# Patient Record
Sex: Female | Born: 1950 | Race: White | Hispanic: No | Marital: Married | Smoking: Former smoker
Health system: Southern US, Community
[De-identification: ages and names within clinical notes are randomized; demographics above are authoritative.]

## PROBLEM LIST (undated history)

## (undated) DIAGNOSIS — F419 Anxiety disorder, unspecified: Secondary | ICD-10-CM

## (undated) DIAGNOSIS — K5792 Diverticulitis of intestine, part unspecified, without perforation or abscess without bleeding: Secondary | ICD-10-CM

## (undated) DIAGNOSIS — R112 Nausea with vomiting, unspecified: Secondary | ICD-10-CM

## (undated) DIAGNOSIS — M5126 Other intervertebral disc displacement, lumbar region: Secondary | ICD-10-CM

## (undated) DIAGNOSIS — N390 Urinary tract infection, site not specified: Secondary | ICD-10-CM

## (undated) DIAGNOSIS — B019 Varicella without complication: Secondary | ICD-10-CM

## (undated) DIAGNOSIS — Z9221 Personal history of antineoplastic chemotherapy: Secondary | ICD-10-CM

## (undated) DIAGNOSIS — Z923 Personal history of irradiation: Secondary | ICD-10-CM

## (undated) DIAGNOSIS — F32A Depression, unspecified: Secondary | ICD-10-CM

## (undated) DIAGNOSIS — M1990 Unspecified osteoarthritis, unspecified site: Secondary | ICD-10-CM

## (undated) DIAGNOSIS — C801 Malignant (primary) neoplasm, unspecified: Secondary | ICD-10-CM

## (undated) DIAGNOSIS — T7840XA Allergy, unspecified, initial encounter: Secondary | ICD-10-CM

## (undated) DIAGNOSIS — Z9889 Other specified postprocedural states: Secondary | ICD-10-CM

## (undated) DIAGNOSIS — F329 Major depressive disorder, single episode, unspecified: Secondary | ICD-10-CM

## (undated) HISTORY — PX: TONSILECTOMY/ADENOIDECTOMY WITH MYRINGOTOMY: SHX6125

## (undated) HISTORY — DX: Varicella without complication: B01.9

## (undated) HISTORY — DX: Major depressive disorder, single episode, unspecified: F32.9

## (undated) HISTORY — PX: FRACTURE SURGERY: SHX138

## (undated) HISTORY — PX: TUBAL LIGATION: SHX77

## (undated) HISTORY — DX: Urinary tract infection, site not specified: N39.0

## (undated) HISTORY — DX: Depression, unspecified: F32.A

## (undated) HISTORY — DX: Allergy, unspecified, initial encounter: T78.40XA

## (undated) HISTORY — DX: Diverticulitis of intestine, part unspecified, without perforation or abscess without bleeding: K57.92

## (undated) HISTORY — PX: COSMETIC SURGERY: SHX468

## (undated) HISTORY — DX: Unspecified osteoarthritis, unspecified site: M19.90

## (undated) HISTORY — PX: COLONOSCOPY: SHX174

## (undated) HISTORY — PX: ABDOMINAL HYSTERECTOMY: SHX81

## (undated) HISTORY — DX: Anxiety disorder, unspecified: F41.9

## (undated) HISTORY — PX: TONSILLECTOMY: SUR1361

---

## 1997-12-27 HISTORY — PX: BREAST BIOPSY: SHX20

## 1998-12-27 HISTORY — PX: BREAST LUMPECTOMY: SHX2

## 2015-08-18 LAB — HM COLONOSCOPY

## 2016-08-16 DIAGNOSIS — Z85828 Personal history of other malignant neoplasm of skin: Secondary | ICD-10-CM | POA: Diagnosis not present

## 2016-08-16 DIAGNOSIS — Z0000 Encounter for general adult medical examination without abnormal findings: Secondary | ICD-10-CM | POA: Diagnosis not present

## 2016-08-16 DIAGNOSIS — Z1283 Encounter for screening for malignant neoplasm of skin: Secondary | ICD-10-CM | POA: Diagnosis not present

## 2016-08-16 DIAGNOSIS — L570 Actinic keratosis: Secondary | ICD-10-CM | POA: Diagnosis not present

## 2016-08-16 DIAGNOSIS — L821 Other seborrheic keratosis: Secondary | ICD-10-CM | POA: Diagnosis not present

## 2016-08-17 DIAGNOSIS — Z1231 Encounter for screening mammogram for malignant neoplasm of breast: Secondary | ICD-10-CM | POA: Diagnosis not present

## 2016-08-18 DIAGNOSIS — Z853 Personal history of malignant neoplasm of breast: Secondary | ICD-10-CM | POA: Diagnosis not present

## 2016-08-18 DIAGNOSIS — F411 Generalized anxiety disorder: Secondary | ICD-10-CM | POA: Diagnosis not present

## 2016-08-18 DIAGNOSIS — Z08 Encounter for follow-up examination after completed treatment for malignant neoplasm: Secondary | ICD-10-CM | POA: Diagnosis not present

## 2016-08-18 DIAGNOSIS — Z0000 Encounter for general adult medical examination without abnormal findings: Secondary | ICD-10-CM | POA: Diagnosis not present

## 2016-08-18 DIAGNOSIS — H9193 Unspecified hearing loss, bilateral: Secondary | ICD-10-CM | POA: Diagnosis not present

## 2016-08-19 DIAGNOSIS — H16223 Keratoconjunctivitis sicca, not specified as Sjogren's, bilateral: Secondary | ICD-10-CM | POA: Diagnosis not present

## 2016-08-19 DIAGNOSIS — H43813 Vitreous degeneration, bilateral: Secondary | ICD-10-CM | POA: Diagnosis not present

## 2016-08-19 DIAGNOSIS — H2513 Age-related nuclear cataract, bilateral: Secondary | ICD-10-CM | POA: Diagnosis not present

## 2016-11-11 DIAGNOSIS — Z23 Encounter for immunization: Secondary | ICD-10-CM | POA: Diagnosis not present

## 2016-11-13 DIAGNOSIS — H8110 Benign paroxysmal vertigo, unspecified ear: Secondary | ICD-10-CM | POA: Diagnosis not present

## 2016-11-24 DIAGNOSIS — R42 Dizziness and giddiness: Secondary | ICD-10-CM | POA: Diagnosis not present

## 2016-11-24 DIAGNOSIS — Z853 Personal history of malignant neoplasm of breast: Secondary | ICD-10-CM | POA: Diagnosis not present

## 2016-11-24 DIAGNOSIS — Z9221 Personal history of antineoplastic chemotherapy: Secondary | ICD-10-CM | POA: Diagnosis not present

## 2017-02-07 DIAGNOSIS — L905 Scar conditions and fibrosis of skin: Secondary | ICD-10-CM | POA: Diagnosis not present

## 2017-02-09 DIAGNOSIS — Z136 Encounter for screening for cardiovascular disorders: Secondary | ICD-10-CM | POA: Diagnosis not present

## 2017-02-09 DIAGNOSIS — Z131 Encounter for screening for diabetes mellitus: Secondary | ICD-10-CM | POA: Diagnosis not present

## 2017-02-09 DIAGNOSIS — M899 Disorder of bone, unspecified: Secondary | ICD-10-CM | POA: Diagnosis not present

## 2017-02-09 DIAGNOSIS — F411 Generalized anxiety disorder: Secondary | ICD-10-CM | POA: Diagnosis not present

## 2017-02-09 DIAGNOSIS — Z0000 Encounter for general adult medical examination without abnormal findings: Secondary | ICD-10-CM | POA: Diagnosis not present

## 2017-02-11 DIAGNOSIS — F411 Generalized anxiety disorder: Secondary | ICD-10-CM | POA: Diagnosis not present

## 2017-02-17 DIAGNOSIS — L821 Other seborrheic keratosis: Secondary | ICD-10-CM | POA: Diagnosis not present

## 2017-02-21 DIAGNOSIS — L538 Other specified erythematous conditions: Secondary | ICD-10-CM | POA: Diagnosis not present

## 2017-02-21 DIAGNOSIS — R208 Other disturbances of skin sensation: Secondary | ICD-10-CM | POA: Diagnosis not present

## 2017-02-21 DIAGNOSIS — L820 Inflamed seborrheic keratosis: Secondary | ICD-10-CM | POA: Diagnosis not present

## 2017-05-04 DIAGNOSIS — M9905 Segmental and somatic dysfunction of pelvic region: Secondary | ICD-10-CM | POA: Diagnosis not present

## 2017-05-04 DIAGNOSIS — M545 Low back pain: Secondary | ICD-10-CM | POA: Diagnosis not present

## 2017-05-04 DIAGNOSIS — M5432 Sciatica, left side: Secondary | ICD-10-CM | POA: Diagnosis not present

## 2017-05-04 DIAGNOSIS — M62830 Muscle spasm of back: Secondary | ICD-10-CM | POA: Diagnosis not present

## 2017-05-11 DIAGNOSIS — M62830 Muscle spasm of back: Secondary | ICD-10-CM | POA: Diagnosis not present

## 2017-05-11 DIAGNOSIS — M545 Low back pain: Secondary | ICD-10-CM | POA: Diagnosis not present

## 2017-05-11 DIAGNOSIS — M5432 Sciatica, left side: Secondary | ICD-10-CM | POA: Diagnosis not present

## 2017-05-11 DIAGNOSIS — M9905 Segmental and somatic dysfunction of pelvic region: Secondary | ICD-10-CM | POA: Diagnosis not present

## 2017-05-18 DIAGNOSIS — M5432 Sciatica, left side: Secondary | ICD-10-CM | POA: Diagnosis not present

## 2017-05-18 DIAGNOSIS — M9905 Segmental and somatic dysfunction of pelvic region: Secondary | ICD-10-CM | POA: Diagnosis not present

## 2017-05-18 DIAGNOSIS — M62830 Muscle spasm of back: Secondary | ICD-10-CM | POA: Diagnosis not present

## 2017-05-18 DIAGNOSIS — M545 Low back pain: Secondary | ICD-10-CM | POA: Diagnosis not present

## 2017-05-25 DIAGNOSIS — M545 Low back pain: Secondary | ICD-10-CM | POA: Diagnosis not present

## 2017-05-25 DIAGNOSIS — M5432 Sciatica, left side: Secondary | ICD-10-CM | POA: Diagnosis not present

## 2017-05-25 DIAGNOSIS — M9905 Segmental and somatic dysfunction of pelvic region: Secondary | ICD-10-CM | POA: Diagnosis not present

## 2017-05-25 DIAGNOSIS — M62830 Muscle spasm of back: Secondary | ICD-10-CM | POA: Diagnosis not present

## 2017-06-15 DIAGNOSIS — M5432 Sciatica, left side: Secondary | ICD-10-CM | POA: Diagnosis not present

## 2017-06-15 DIAGNOSIS — M62830 Muscle spasm of back: Secondary | ICD-10-CM | POA: Diagnosis not present

## 2017-06-15 DIAGNOSIS — M9905 Segmental and somatic dysfunction of pelvic region: Secondary | ICD-10-CM | POA: Diagnosis not present

## 2017-06-15 DIAGNOSIS — M545 Low back pain: Secondary | ICD-10-CM | POA: Diagnosis not present

## 2017-07-13 DIAGNOSIS — M545 Low back pain: Secondary | ICD-10-CM | POA: Diagnosis not present

## 2017-07-13 DIAGNOSIS — L570 Actinic keratosis: Secondary | ICD-10-CM | POA: Diagnosis not present

## 2017-07-13 DIAGNOSIS — M9905 Segmental and somatic dysfunction of pelvic region: Secondary | ICD-10-CM | POA: Diagnosis not present

## 2017-07-13 DIAGNOSIS — Z08 Encounter for follow-up examination after completed treatment for malignant neoplasm: Secondary | ICD-10-CM | POA: Diagnosis not present

## 2017-07-13 DIAGNOSIS — Z85828 Personal history of other malignant neoplasm of skin: Secondary | ICD-10-CM | POA: Diagnosis not present

## 2017-07-13 DIAGNOSIS — M5432 Sciatica, left side: Secondary | ICD-10-CM | POA: Diagnosis not present

## 2017-07-13 DIAGNOSIS — M62830 Muscle spasm of back: Secondary | ICD-10-CM | POA: Diagnosis not present

## 2017-07-27 DIAGNOSIS — M5432 Sciatica, left side: Secondary | ICD-10-CM | POA: Diagnosis not present

## 2017-07-27 DIAGNOSIS — M62830 Muscle spasm of back: Secondary | ICD-10-CM | POA: Diagnosis not present

## 2017-07-27 DIAGNOSIS — M545 Low back pain: Secondary | ICD-10-CM | POA: Diagnosis not present

## 2017-07-27 DIAGNOSIS — M9905 Segmental and somatic dysfunction of pelvic region: Secondary | ICD-10-CM | POA: Diagnosis not present

## 2017-08-10 DIAGNOSIS — M9905 Segmental and somatic dysfunction of pelvic region: Secondary | ICD-10-CM | POA: Diagnosis not present

## 2017-08-10 DIAGNOSIS — M5432 Sciatica, left side: Secondary | ICD-10-CM | POA: Diagnosis not present

## 2017-08-10 DIAGNOSIS — M62830 Muscle spasm of back: Secondary | ICD-10-CM | POA: Diagnosis not present

## 2017-08-10 DIAGNOSIS — M545 Low back pain: Secondary | ICD-10-CM | POA: Diagnosis not present

## 2017-08-31 DIAGNOSIS — M9905 Segmental and somatic dysfunction of pelvic region: Secondary | ICD-10-CM | POA: Diagnosis not present

## 2017-08-31 DIAGNOSIS — M62830 Muscle spasm of back: Secondary | ICD-10-CM | POA: Diagnosis not present

## 2017-08-31 DIAGNOSIS — M5432 Sciatica, left side: Secondary | ICD-10-CM | POA: Diagnosis not present

## 2017-08-31 DIAGNOSIS — Z85828 Personal history of other malignant neoplasm of skin: Secondary | ICD-10-CM | POA: Diagnosis not present

## 2017-08-31 DIAGNOSIS — D225 Melanocytic nevi of trunk: Secondary | ICD-10-CM | POA: Diagnosis not present

## 2017-08-31 DIAGNOSIS — D485 Neoplasm of uncertain behavior of skin: Secondary | ICD-10-CM | POA: Diagnosis not present

## 2017-08-31 DIAGNOSIS — Z08 Encounter for follow-up examination after completed treatment for malignant neoplasm: Secondary | ICD-10-CM | POA: Diagnosis not present

## 2017-08-31 DIAGNOSIS — D2272 Melanocytic nevi of left lower limb, including hip: Secondary | ICD-10-CM | POA: Diagnosis not present

## 2017-08-31 DIAGNOSIS — M545 Low back pain: Secondary | ICD-10-CM | POA: Diagnosis not present

## 2017-09-15 DIAGNOSIS — Z08 Encounter for follow-up examination after completed treatment for malignant neoplasm: Secondary | ICD-10-CM | POA: Diagnosis not present

## 2017-09-15 DIAGNOSIS — Z853 Personal history of malignant neoplasm of breast: Secondary | ICD-10-CM | POA: Diagnosis not present

## 2017-09-19 DIAGNOSIS — M62830 Muscle spasm of back: Secondary | ICD-10-CM | POA: Diagnosis not present

## 2017-09-19 DIAGNOSIS — M9905 Segmental and somatic dysfunction of pelvic region: Secondary | ICD-10-CM | POA: Diagnosis not present

## 2017-09-19 DIAGNOSIS — M545 Low back pain: Secondary | ICD-10-CM | POA: Diagnosis not present

## 2017-09-19 DIAGNOSIS — M5432 Sciatica, left side: Secondary | ICD-10-CM | POA: Diagnosis not present

## 2017-09-30 DIAGNOSIS — Z23 Encounter for immunization: Secondary | ICD-10-CM | POA: Diagnosis not present

## 2017-10-10 DIAGNOSIS — Z1231 Encounter for screening mammogram for malignant neoplasm of breast: Secondary | ICD-10-CM | POA: Diagnosis not present

## 2017-10-11 DIAGNOSIS — L820 Inflamed seborrheic keratosis: Secondary | ICD-10-CM | POA: Diagnosis not present

## 2017-10-11 DIAGNOSIS — D485 Neoplasm of uncertain behavior of skin: Secondary | ICD-10-CM | POA: Diagnosis not present

## 2017-10-11 DIAGNOSIS — Z872 Personal history of diseases of the skin and subcutaneous tissue: Secondary | ICD-10-CM | POA: Diagnosis not present

## 2017-10-11 DIAGNOSIS — L298 Other pruritus: Secondary | ICD-10-CM | POA: Diagnosis not present

## 2017-10-11 DIAGNOSIS — D2272 Melanocytic nevi of left lower limb, including hip: Secondary | ICD-10-CM | POA: Diagnosis not present

## 2017-10-11 DIAGNOSIS — Z09 Encounter for follow-up examination after completed treatment for conditions other than malignant neoplasm: Secondary | ICD-10-CM | POA: Diagnosis not present

## 2017-10-11 DIAGNOSIS — D225 Melanocytic nevi of trunk: Secondary | ICD-10-CM | POA: Diagnosis not present

## 2017-10-12 DIAGNOSIS — M545 Low back pain: Secondary | ICD-10-CM | POA: Diagnosis not present

## 2017-10-12 DIAGNOSIS — M9905 Segmental and somatic dysfunction of pelvic region: Secondary | ICD-10-CM | POA: Diagnosis not present

## 2017-10-12 DIAGNOSIS — M5432 Sciatica, left side: Secondary | ICD-10-CM | POA: Diagnosis not present

## 2017-10-12 DIAGNOSIS — M62830 Muscle spasm of back: Secondary | ICD-10-CM | POA: Diagnosis not present

## 2017-11-09 DIAGNOSIS — M9905 Segmental and somatic dysfunction of pelvic region: Secondary | ICD-10-CM | POA: Diagnosis not present

## 2017-11-09 DIAGNOSIS — M545 Low back pain: Secondary | ICD-10-CM | POA: Diagnosis not present

## 2017-11-09 DIAGNOSIS — M5432 Sciatica, left side: Secondary | ICD-10-CM | POA: Diagnosis not present

## 2017-11-09 DIAGNOSIS — M62830 Muscle spasm of back: Secondary | ICD-10-CM | POA: Diagnosis not present

## 2017-11-30 DIAGNOSIS — M545 Low back pain: Secondary | ICD-10-CM | POA: Diagnosis not present

## 2017-11-30 DIAGNOSIS — M9905 Segmental and somatic dysfunction of pelvic region: Secondary | ICD-10-CM | POA: Diagnosis not present

## 2017-11-30 DIAGNOSIS — M5432 Sciatica, left side: Secondary | ICD-10-CM | POA: Diagnosis not present

## 2017-11-30 DIAGNOSIS — M62830 Muscle spasm of back: Secondary | ICD-10-CM | POA: Diagnosis not present

## 2018-01-04 DIAGNOSIS — M9905 Segmental and somatic dysfunction of pelvic region: Secondary | ICD-10-CM | POA: Diagnosis not present

## 2018-01-04 DIAGNOSIS — M62830 Muscle spasm of back: Secondary | ICD-10-CM | POA: Diagnosis not present

## 2018-01-04 DIAGNOSIS — M5432 Sciatica, left side: Secondary | ICD-10-CM | POA: Diagnosis not present

## 2018-01-04 DIAGNOSIS — M545 Low back pain: Secondary | ICD-10-CM | POA: Diagnosis not present

## 2018-02-14 DIAGNOSIS — R82998 Other abnormal findings in urine: Secondary | ICD-10-CM | POA: Diagnosis not present

## 2018-02-14 DIAGNOSIS — F4310 Post-traumatic stress disorder, unspecified: Secondary | ICD-10-CM | POA: Diagnosis not present

## 2018-02-14 DIAGNOSIS — R7989 Other specified abnormal findings of blood chemistry: Secondary | ICD-10-CM | POA: Diagnosis not present

## 2018-02-14 DIAGNOSIS — Z0000 Encounter for general adult medical examination without abnormal findings: Secondary | ICD-10-CM | POA: Diagnosis not present

## 2018-02-14 DIAGNOSIS — Z6821 Body mass index (BMI) 21.0-21.9, adult: Secondary | ICD-10-CM | POA: Diagnosis not present

## 2018-02-14 DIAGNOSIS — Z853 Personal history of malignant neoplasm of breast: Secondary | ICD-10-CM | POA: Diagnosis not present

## 2018-02-14 DIAGNOSIS — H9193 Unspecified hearing loss, bilateral: Secondary | ICD-10-CM | POA: Diagnosis not present

## 2018-02-14 DIAGNOSIS — Z79899 Other long term (current) drug therapy: Secondary | ICD-10-CM | POA: Diagnosis not present

## 2018-02-14 LAB — BASIC METABOLIC PANEL
BUN: 14 (ref 4–21)
Creatinine: 0.9 (ref 0.5–1.1)
Glucose: 108
Sodium: 140 (ref 137–147)

## 2018-02-14 LAB — VITAMIN D 25 HYDROXY (VIT D DEFICIENCY, FRACTURES): Vit D, 25-Hydroxy: 50.5

## 2018-02-14 LAB — CBC AND DIFFERENTIAL: WBC: 6.3

## 2018-02-14 LAB — TSH: TSH: 1.26 (ref 0.41–5.90)

## 2018-02-14 LAB — HEPATIC FUNCTION PANEL: Bilirubin, Total: 0.5

## 2018-02-15 DIAGNOSIS — M62830 Muscle spasm of back: Secondary | ICD-10-CM | POA: Diagnosis not present

## 2018-02-15 DIAGNOSIS — M545 Low back pain: Secondary | ICD-10-CM | POA: Diagnosis not present

## 2018-02-15 DIAGNOSIS — M5432 Sciatica, left side: Secondary | ICD-10-CM | POA: Diagnosis not present

## 2018-02-15 DIAGNOSIS — M9905 Segmental and somatic dysfunction of pelvic region: Secondary | ICD-10-CM | POA: Diagnosis not present

## 2018-02-22 DIAGNOSIS — D485 Neoplasm of uncertain behavior of skin: Secondary | ICD-10-CM | POA: Diagnosis not present

## 2018-02-22 DIAGNOSIS — L570 Actinic keratosis: Secondary | ICD-10-CM | POA: Diagnosis not present

## 2018-02-22 DIAGNOSIS — Z1283 Encounter for screening for malignant neoplasm of skin: Secondary | ICD-10-CM | POA: Diagnosis not present

## 2018-02-22 DIAGNOSIS — L821 Other seborrheic keratosis: Secondary | ICD-10-CM | POA: Diagnosis not present

## 2018-03-15 DIAGNOSIS — M859 Disorder of bone density and structure, unspecified: Secondary | ICD-10-CM | POA: Diagnosis not present

## 2018-03-15 DIAGNOSIS — Z23 Encounter for immunization: Secondary | ICD-10-CM | POA: Diagnosis not present

## 2018-03-15 DIAGNOSIS — L905 Scar conditions and fibrosis of skin: Secondary | ICD-10-CM | POA: Diagnosis not present

## 2018-03-15 DIAGNOSIS — Z85828 Personal history of other malignant neoplasm of skin: Secondary | ICD-10-CM | POA: Diagnosis not present

## 2018-03-15 LAB — HM DEXA SCAN

## 2018-03-16 DIAGNOSIS — Z1212 Encounter for screening for malignant neoplasm of rectum: Secondary | ICD-10-CM | POA: Diagnosis not present

## 2018-03-16 LAB — IFOBT (OCCULT BLOOD): IFOBT: NEGATIVE

## 2018-03-22 DIAGNOSIS — M9905 Segmental and somatic dysfunction of pelvic region: Secondary | ICD-10-CM | POA: Diagnosis not present

## 2018-03-22 DIAGNOSIS — M62830 Muscle spasm of back: Secondary | ICD-10-CM | POA: Diagnosis not present

## 2018-03-22 DIAGNOSIS — M5432 Sciatica, left side: Secondary | ICD-10-CM | POA: Diagnosis not present

## 2018-03-22 DIAGNOSIS — M545 Low back pain: Secondary | ICD-10-CM | POA: Diagnosis not present

## 2018-03-27 DIAGNOSIS — L570 Actinic keratosis: Secondary | ICD-10-CM | POA: Diagnosis not present

## 2018-03-30 DIAGNOSIS — N390 Urinary tract infection, site not specified: Secondary | ICD-10-CM | POA: Diagnosis not present

## 2018-03-30 DIAGNOSIS — R300 Dysuria: Secondary | ICD-10-CM | POA: Diagnosis not present

## 2018-04-22 DIAGNOSIS — M62830 Muscle spasm of back: Secondary | ICD-10-CM | POA: Diagnosis not present

## 2018-04-22 DIAGNOSIS — M9905 Segmental and somatic dysfunction of pelvic region: Secondary | ICD-10-CM | POA: Diagnosis not present

## 2018-04-22 DIAGNOSIS — M545 Low back pain: Secondary | ICD-10-CM | POA: Diagnosis not present

## 2018-04-22 DIAGNOSIS — M5432 Sciatica, left side: Secondary | ICD-10-CM | POA: Diagnosis not present

## 2018-05-11 DIAGNOSIS — H25013 Cortical age-related cataract, bilateral: Secondary | ICD-10-CM | POA: Diagnosis not present

## 2018-05-11 DIAGNOSIS — H04123 Dry eye syndrome of bilateral lacrimal glands: Secondary | ICD-10-CM | POA: Diagnosis not present

## 2018-05-11 DIAGNOSIS — H5213 Myopia, bilateral: Secondary | ICD-10-CM | POA: Diagnosis not present

## 2018-05-11 LAB — HM DIABETES EYE EXAM

## 2018-07-15 DIAGNOSIS — M545 Low back pain: Secondary | ICD-10-CM | POA: Diagnosis not present

## 2018-07-15 DIAGNOSIS — M62830 Muscle spasm of back: Secondary | ICD-10-CM | POA: Diagnosis not present

## 2018-07-15 DIAGNOSIS — M9905 Segmental and somatic dysfunction of pelvic region: Secondary | ICD-10-CM | POA: Diagnosis not present

## 2018-07-15 DIAGNOSIS — M5432 Sciatica, left side: Secondary | ICD-10-CM | POA: Diagnosis not present

## 2018-09-02 DIAGNOSIS — Z23 Encounter for immunization: Secondary | ICD-10-CM | POA: Diagnosis not present

## 2018-10-09 DIAGNOSIS — L814 Other melanin hyperpigmentation: Secondary | ICD-10-CM | POA: Diagnosis not present

## 2018-10-09 DIAGNOSIS — D225 Melanocytic nevi of trunk: Secondary | ICD-10-CM | POA: Diagnosis not present

## 2018-10-09 DIAGNOSIS — Z23 Encounter for immunization: Secondary | ICD-10-CM | POA: Diagnosis not present

## 2018-10-09 DIAGNOSIS — Z85828 Personal history of other malignant neoplasm of skin: Secondary | ICD-10-CM | POA: Diagnosis not present

## 2018-10-09 DIAGNOSIS — D2261 Melanocytic nevi of right upper limb, including shoulder: Secondary | ICD-10-CM | POA: Diagnosis not present

## 2018-10-09 DIAGNOSIS — L821 Other seborrheic keratosis: Secondary | ICD-10-CM | POA: Diagnosis not present

## 2018-10-09 DIAGNOSIS — Z86018 Personal history of other benign neoplasm: Secondary | ICD-10-CM | POA: Diagnosis not present

## 2018-10-09 DIAGNOSIS — L820 Inflamed seborrheic keratosis: Secondary | ICD-10-CM | POA: Diagnosis not present

## 2018-10-12 DIAGNOSIS — M9902 Segmental and somatic dysfunction of thoracic region: Secondary | ICD-10-CM | POA: Diagnosis not present

## 2018-10-12 DIAGNOSIS — M9903 Segmental and somatic dysfunction of lumbar region: Secondary | ICD-10-CM | POA: Diagnosis not present

## 2018-10-12 DIAGNOSIS — M9905 Segmental and somatic dysfunction of pelvic region: Secondary | ICD-10-CM | POA: Diagnosis not present

## 2018-10-12 DIAGNOSIS — M4716 Other spondylosis with myelopathy, lumbar region: Secondary | ICD-10-CM | POA: Diagnosis not present

## 2018-10-17 DIAGNOSIS — M9903 Segmental and somatic dysfunction of lumbar region: Secondary | ICD-10-CM | POA: Diagnosis not present

## 2018-10-17 DIAGNOSIS — M9905 Segmental and somatic dysfunction of pelvic region: Secondary | ICD-10-CM | POA: Diagnosis not present

## 2018-10-17 DIAGNOSIS — M4716 Other spondylosis with myelopathy, lumbar region: Secondary | ICD-10-CM | POA: Diagnosis not present

## 2018-10-17 DIAGNOSIS — M9902 Segmental and somatic dysfunction of thoracic region: Secondary | ICD-10-CM | POA: Diagnosis not present

## 2018-10-25 ENCOUNTER — Other Ambulatory Visit: Payer: Self-pay | Admitting: Internal Medicine

## 2018-10-25 DIAGNOSIS — Z1231 Encounter for screening mammogram for malignant neoplasm of breast: Secondary | ICD-10-CM

## 2018-11-08 ENCOUNTER — Other Ambulatory Visit: Payer: Self-pay | Admitting: Internal Medicine

## 2018-11-08 DIAGNOSIS — Z1231 Encounter for screening mammogram for malignant neoplasm of breast: Secondary | ICD-10-CM

## 2018-11-14 DIAGNOSIS — M9905 Segmental and somatic dysfunction of pelvic region: Secondary | ICD-10-CM | POA: Diagnosis not present

## 2018-11-14 DIAGNOSIS — M9902 Segmental and somatic dysfunction of thoracic region: Secondary | ICD-10-CM | POA: Diagnosis not present

## 2018-11-14 DIAGNOSIS — M4716 Other spondylosis with myelopathy, lumbar region: Secondary | ICD-10-CM | POA: Diagnosis not present

## 2018-11-14 DIAGNOSIS — M9903 Segmental and somatic dysfunction of lumbar region: Secondary | ICD-10-CM | POA: Diagnosis not present

## 2018-11-16 DIAGNOSIS — M9902 Segmental and somatic dysfunction of thoracic region: Secondary | ICD-10-CM | POA: Diagnosis not present

## 2018-11-16 DIAGNOSIS — M4716 Other spondylosis with myelopathy, lumbar region: Secondary | ICD-10-CM | POA: Diagnosis not present

## 2018-11-16 DIAGNOSIS — M9903 Segmental and somatic dysfunction of lumbar region: Secondary | ICD-10-CM | POA: Diagnosis not present

## 2018-11-16 DIAGNOSIS — M9905 Segmental and somatic dysfunction of pelvic region: Secondary | ICD-10-CM | POA: Diagnosis not present

## 2018-11-21 DIAGNOSIS — M9903 Segmental and somatic dysfunction of lumbar region: Secondary | ICD-10-CM | POA: Diagnosis not present

## 2018-11-21 DIAGNOSIS — M4716 Other spondylosis with myelopathy, lumbar region: Secondary | ICD-10-CM | POA: Diagnosis not present

## 2018-11-21 DIAGNOSIS — M9902 Segmental and somatic dysfunction of thoracic region: Secondary | ICD-10-CM | POA: Diagnosis not present

## 2018-11-21 DIAGNOSIS — M9905 Segmental and somatic dysfunction of pelvic region: Secondary | ICD-10-CM | POA: Diagnosis not present

## 2018-11-28 DIAGNOSIS — M9905 Segmental and somatic dysfunction of pelvic region: Secondary | ICD-10-CM | POA: Diagnosis not present

## 2018-11-28 DIAGNOSIS — M9902 Segmental and somatic dysfunction of thoracic region: Secondary | ICD-10-CM | POA: Diagnosis not present

## 2018-11-28 DIAGNOSIS — M9903 Segmental and somatic dysfunction of lumbar region: Secondary | ICD-10-CM | POA: Diagnosis not present

## 2018-11-28 DIAGNOSIS — M4716 Other spondylosis with myelopathy, lumbar region: Secondary | ICD-10-CM | POA: Diagnosis not present

## 2018-11-30 DIAGNOSIS — M4716 Other spondylosis with myelopathy, lumbar region: Secondary | ICD-10-CM | POA: Diagnosis not present

## 2018-11-30 DIAGNOSIS — M9902 Segmental and somatic dysfunction of thoracic region: Secondary | ICD-10-CM | POA: Diagnosis not present

## 2018-11-30 DIAGNOSIS — M9903 Segmental and somatic dysfunction of lumbar region: Secondary | ICD-10-CM | POA: Diagnosis not present

## 2018-11-30 DIAGNOSIS — M9905 Segmental and somatic dysfunction of pelvic region: Secondary | ICD-10-CM | POA: Diagnosis not present

## 2018-12-07 DIAGNOSIS — M4716 Other spondylosis with myelopathy, lumbar region: Secondary | ICD-10-CM | POA: Diagnosis not present

## 2018-12-07 DIAGNOSIS — M9905 Segmental and somatic dysfunction of pelvic region: Secondary | ICD-10-CM | POA: Diagnosis not present

## 2018-12-07 DIAGNOSIS — M9903 Segmental and somatic dysfunction of lumbar region: Secondary | ICD-10-CM | POA: Diagnosis not present

## 2018-12-07 DIAGNOSIS — M9902 Segmental and somatic dysfunction of thoracic region: Secondary | ICD-10-CM | POA: Diagnosis not present

## 2018-12-15 DIAGNOSIS — M9905 Segmental and somatic dysfunction of pelvic region: Secondary | ICD-10-CM | POA: Diagnosis not present

## 2018-12-15 DIAGNOSIS — M4716 Other spondylosis with myelopathy, lumbar region: Secondary | ICD-10-CM | POA: Diagnosis not present

## 2018-12-15 DIAGNOSIS — M9903 Segmental and somatic dysfunction of lumbar region: Secondary | ICD-10-CM | POA: Diagnosis not present

## 2018-12-15 DIAGNOSIS — M9902 Segmental and somatic dysfunction of thoracic region: Secondary | ICD-10-CM | POA: Diagnosis not present

## 2018-12-18 ENCOUNTER — Ambulatory Visit
Admission: RE | Admit: 2018-12-18 | Discharge: 2018-12-18 | Disposition: A | Discharge status: Home / Self Care | Payer: Medicare Other | Source: Ambulatory Visit | Attending: Internal Medicine | Admitting: Internal Medicine

## 2018-12-18 DIAGNOSIS — Z1231 Encounter for screening mammogram for malignant neoplasm of breast: Secondary | ICD-10-CM | POA: Diagnosis not present

## 2018-12-18 HISTORY — DX: Personal history of irradiation: Z92.3

## 2018-12-18 HISTORY — DX: Personal history of antineoplastic chemotherapy: Z92.21

## 2018-12-18 LAB — HM MAMMOGRAPHY

## 2019-01-19 DIAGNOSIS — M9905 Segmental and somatic dysfunction of pelvic region: Secondary | ICD-10-CM | POA: Diagnosis not present

## 2019-01-19 DIAGNOSIS — M9903 Segmental and somatic dysfunction of lumbar region: Secondary | ICD-10-CM | POA: Diagnosis not present

## 2019-01-19 DIAGNOSIS — M9902 Segmental and somatic dysfunction of thoracic region: Secondary | ICD-10-CM | POA: Diagnosis not present

## 2019-01-19 DIAGNOSIS — M4716 Other spondylosis with myelopathy, lumbar region: Secondary | ICD-10-CM | POA: Diagnosis not present

## 2019-01-24 DIAGNOSIS — Z853 Personal history of malignant neoplasm of breast: Secondary | ICD-10-CM | POA: Diagnosis not present

## 2019-01-24 DIAGNOSIS — Z08 Encounter for follow-up examination after completed treatment for malignant neoplasm: Secondary | ICD-10-CM | POA: Diagnosis not present

## 2019-02-20 DIAGNOSIS — M4716 Other spondylosis with myelopathy, lumbar region: Secondary | ICD-10-CM | POA: Diagnosis not present

## 2019-02-20 DIAGNOSIS — M9902 Segmental and somatic dysfunction of thoracic region: Secondary | ICD-10-CM | POA: Diagnosis not present

## 2019-02-20 DIAGNOSIS — M9905 Segmental and somatic dysfunction of pelvic region: Secondary | ICD-10-CM | POA: Diagnosis not present

## 2019-02-20 DIAGNOSIS — M9903 Segmental and somatic dysfunction of lumbar region: Secondary | ICD-10-CM | POA: Diagnosis not present

## 2019-03-21 DIAGNOSIS — M9902 Segmental and somatic dysfunction of thoracic region: Secondary | ICD-10-CM | POA: Diagnosis not present

## 2019-03-21 DIAGNOSIS — M4716 Other spondylosis with myelopathy, lumbar region: Secondary | ICD-10-CM | POA: Diagnosis not present

## 2019-03-21 DIAGNOSIS — M9903 Segmental and somatic dysfunction of lumbar region: Secondary | ICD-10-CM | POA: Diagnosis not present

## 2019-03-21 DIAGNOSIS — M9905 Segmental and somatic dysfunction of pelvic region: Secondary | ICD-10-CM | POA: Diagnosis not present

## 2019-03-23 ENCOUNTER — Encounter: Payer: Self-pay | Admitting: Family Medicine

## 2019-03-23 ENCOUNTER — Ambulatory Visit (INDEPENDENT_AMBULATORY_CARE_PROVIDER_SITE_OTHER): Payer: Medicare Other | Admitting: Family Medicine

## 2019-03-23 ENCOUNTER — Other Ambulatory Visit: Payer: Self-pay

## 2019-03-23 VITALS — BP 100/62 | HR 58 | Temp 97.8°F | Ht 64.0 in | Wt 118.8 lb

## 2019-03-23 DIAGNOSIS — M810 Age-related osteoporosis without current pathological fracture: Secondary | ICD-10-CM

## 2019-03-23 DIAGNOSIS — M5126 Other intervertebral disc displacement, lumbar region: Secondary | ICD-10-CM | POA: Insufficient documentation

## 2019-03-23 DIAGNOSIS — M549 Dorsalgia, unspecified: Secondary | ICD-10-CM | POA: Diagnosis not present

## 2019-03-23 DIAGNOSIS — Z85828 Personal history of other malignant neoplasm of skin: Secondary | ICD-10-CM | POA: Diagnosis not present

## 2019-03-23 DIAGNOSIS — R5383 Other fatigue: Secondary | ICD-10-CM | POA: Diagnosis not present

## 2019-03-23 DIAGNOSIS — Z13220 Encounter for screening for lipoid disorders: Secondary | ICD-10-CM | POA: Diagnosis not present

## 2019-03-23 DIAGNOSIS — F341 Dysthymic disorder: Secondary | ICD-10-CM | POA: Insufficient documentation

## 2019-03-23 DIAGNOSIS — H6121 Impacted cerumen, right ear: Secondary | ICD-10-CM

## 2019-03-23 DIAGNOSIS — Z853 Personal history of malignant neoplasm of breast: Secondary | ICD-10-CM

## 2019-03-23 DIAGNOSIS — H9191 Unspecified hearing loss, right ear: Secondary | ICD-10-CM

## 2019-03-23 DIAGNOSIS — E559 Vitamin D deficiency, unspecified: Secondary | ICD-10-CM

## 2019-03-23 MED ORDER — ALENDRONATE SODIUM 70 MG PO TABS: ORAL_TABLET | ORAL | 1 refills | Status: DC

## 2019-03-23 MED ORDER — SERTRALINE HCL 25 MG PO TABS: 25.0000 mg | ORAL_TABLET | Freq: Every day | ORAL | 1 refills | Status: DC

## 2019-03-23 NOTE — Progress Notes (Signed)
Procedure: Cerumen Disimpaction  The patient had a large amount of cerumen in the external auditory canal(s): right  Ear wax softener was used prior to the lavage.  Ear lavage was performed on right ear(s) by Jasper Loser  Curettage by provider was not performed in addition.   There were no complications and following the disimpaction the tympanic membrane was visible.  Charges to be entered in Charge Capture section.

## 2019-03-23 NOTE — Progress Notes (Signed)
Allison [name] is a 68 y.o. female is here to [place].   Patient Care Team: Briscoe Deutscher, DO as PCP - General (Family Medicine)   History of Present Illness:   Chief Complaint  Patient presents with  . Establish Care    Moved to the area 1 yr ago. Moved back to [place] in 2013. Transferring care from [facility].   Marland Kitchen Hearing Loss    hearing test showed changes in R ear. Would like referral for hearing test/specialist/audiologist.   . Basal Cell Carcinoma    on head. Would like referral to Dermatology.   . Left thumb pain    Very active, does a lot of typing. Has beenn doing a lot of lifting and unpacking d/t recent move. No mechanical sx. Worse w/ palpation. No swelling or bruising.   Marland Kitchen Herniated disc    Would like referral to sports med once OMT is available again.   . Leg spasms    Worse at night, causes night disturbance. Taking Magnesium supplements.   . constantly cold   HPI: See Assessment and Plan section for Problem Based Charting of issues discussed today.   Health Maintenance Due  Topic Date Due  . Hepatitis C Screening  [birth year]  . DEXA SCAN  07/16/2016   Depression screen PHQ 2/9 03/23/2019  Decreased Interest 0  Down, Depressed, Hopeless 0  PHQ - 2 Score 0  Altered sleeping 0  Tired, decreased energy 0  Change in appetite 0  Feeling bad or failure about yourself  0  Trouble concentrating 0  Moving slowly or fidgety/restless 0  Suicidal thoughts 0  PHQ-9 Score 0  Difficult doing work/chores Not difficult at all    PMHx, SurgHx, SocialHx, Medications, and Allergies were reviewed in the Visit Navigator and updated as appropriate.   Past Medical History:  Diagnosis Date  . Chicken pox   . Depression   . Diverticulitis   . Personal history of chemotherapy   . Personal history of radiation therapy   . UTI (urinary tract infection)      Past Surgical History:  Procedure Laterality Date  . ABDOMINAL HYSTERECTOMY    . BREAST  BIOPSY Left 1999  . BREAST LUMPECTOMY Left 2000  . TONSILECTOMY/ADENOIDECTOMY WITH MYRINGOTOMY       Family History  Problem Relation Age of Onset  . Alcohol abuse Mother   . Cancer Mother   . Alcohol abuse Father   . Depression Father   . Early death Father   . Alcohol abuse Brother   . Cancer Brother   . Breast cancer Neg Hx     Social History   Tobacco Use  . Smoking status: Former Smoker    Last attempt to quit: 12/27/1972    Years since quitting: 46.2  . Smokeless tobacco: Never Used  Substance Use Topics  . Alcohol use: Yes  . Drug use: Not on file    Current Medications and Allergies   .  alendronate (FOSAMAX) 70 MG tablet, Take one tab by mouth once weekly on empty stomach with a full glass of water, Disp: 12 tablet, Rfl: 1 .  Calcium Citrate-Vitamin D (CALCIUM + D PO), Take by mouth., Disp: , Rfl:  .  MAGNESIUM PO, Take by mouth., Disp: , Rfl:  .  NIACIN PO, Take by mouth., Disp: , Rfl:  .  OVER THE COUNTER MEDICATION, Chinese herbal supplement, Disp: , Rfl:  .  sertraline (ZOLOFT) 25 MG tablet, Take 1 tablet (25  mg total) by mouth daily., Disp: 90 tablet, Rfl: 1  Allergies  Allergen Reactions  . Penicillins     Childhood allergy  . Sulfa Antibiotics Hives and Swelling   Review of Systems   Pertinent items are noted in the HPI. Otherwise, a complete ROS is negative.  Vitals   Vitals:   03/23/19 0921  BP: 100/62  Pulse: (!) 58  Temp: 97.8 F (36.6 C)  TempSrc: Oral  SpO2: 99%  Weight: 118 lb 12.8 oz (53.9 kg)  Height: 5\' 4"  (1.626 m)     Body mass index is 20.39 kg/m.  Physical Exam   Physical Exam Vitals signs and nursing note reviewed.  Constitutional:      General: She is not in acute distress.    Appearance: She is well-developed.  HENT:     Head: Normocephalic and atraumatic.     Right Ear: External ear normal.     Left Ear: External ear normal.     Ears:     Comments: Right cerumen impaction.    Nose: Nose normal.  Eyes:      Conjunctiva/sclera: Conjunctivae normal.     Pupils: Pupils are equal, round, and reactive to light.  Neck:     Musculoskeletal: Normal range of motion and neck supple.     Thyroid: No thyromegaly.  Cardiovascular:     Rate and Rhythm: Normal rate and regular rhythm.     Heart sounds: Normal heart sounds.  Pulmonary:     Effort: Pulmonary effort is normal.     Breath sounds: Normal breath sounds.  Abdominal:     General: Bowel sounds are normal.     Palpations: Abdomen is soft.  Musculoskeletal: Normal range of motion.  Lymphadenopathy:     Cervical: No cervical adenopathy.  Skin:    General: Skin is warm and dry.     Capillary Refill: Capillary refill takes less than 2 seconds.  Neurological:     Mental Status: She is alert and oriented to person, place, and time.  Psychiatric:        Behavior: Behavior normal.     Assessment and Plan   Allison [name] was seen today for establish care, hearing loss, basal cell carcinoma, left thumb pain, herniated disc, leg spasms and constantly cold.  Diagnoses and all orders for this visit:  Osteoporosis without current pathological fracture, unspecified osteoporosis type Comments: Will start Prolia when available. Discussed weight bearing exercises and vitamin D supplement. Orders: -     alendronate (FOSAMAX) 70 MG tablet; Take one tab by mouth once weekly on empty stomach with a full glass of water  Back pain, unspecified back location, unspecified back pain laterality, unspecified chronicity Comments: Hx of bulging disc. Has been seeing Dr. Rhys Martini, would like to see Dr. Paulla Fore. Orders: -     Ambulatory referral to Sports Medicine  Hx of basal cell carcinoma -     Ambulatory referral to Dermatology  Change in hearing of right ear Comments: Lavage today. Hearing screen - failed. Orders: -     Ambulatory referral to Audiology  Fatigue, unspecified type -     CBC with Differential/Platelet; Future -     Comprehensive metabolic panel;  Future -     TSH; Future  Hx of breast cancer, left Comments: Still seeing Oncologist in [place].   Screening for lipid disorders -     Lipid panel; Future  Vitamin D deficiency -     VITAMIN D 25 Hydroxy (Vit-D Deficiency, Fractures); Future  Dysthymia Comments: Stable on Zoloft 25 mg q hs. Continue.  Orders: -     sertraline (ZOLOFT) 25 MG tablet; Take 1 tablet (25 mg total) by mouth daily.  Impacted cerumen of right ear Comments: Lavage by CMA, see note. Orders: -     Ear Lavage   . Orders and follow up as documented in [place], reviewed diet, exercise and weight control, cardiovascular risk and specific lipid/LDL goals reviewed, reviewed medications and side effects in detail.  . Reviewed expectations re: course of current medical issues. . Outlined signs and symptoms indicating need for more acute intervention. . Patient verbalized understanding and all questions were answered. . Patient received an After Visit Summary.  CMA served as Education administrator during this visit. History, Physical, and Plan performed by medical provider. The above documentation has been reviewed and is accurate and complete. Briscoe Deutscher, D.O.  Briscoe Deutscher, DO [place], [place] [facility] 03/23/2019

## 2019-03-26 ENCOUNTER — Other Ambulatory Visit: Payer: Self-pay

## 2019-03-26 ENCOUNTER — Other Ambulatory Visit (INDEPENDENT_AMBULATORY_CARE_PROVIDER_SITE_OTHER): Payer: Medicare Other

## 2019-03-26 DIAGNOSIS — R5383 Other fatigue: Secondary | ICD-10-CM

## 2019-03-26 DIAGNOSIS — E559 Vitamin D deficiency, unspecified: Secondary | ICD-10-CM

## 2019-03-26 DIAGNOSIS — Z13220 Encounter for screening for lipoid disorders: Secondary | ICD-10-CM | POA: Diagnosis not present

## 2019-03-26 LAB — COMPREHENSIVE METABOLIC PANEL
ALT: 15 U/L (ref 0–35)
AST: 22 U/L (ref 0–37)
Albumin: 4.2 g/dL (ref 3.5–5.2)
Alkaline Phosphatase: 33 U/L — ABNORMAL LOW (ref 39–117)
BUN: 14 mg/dL (ref 6–23)
CO2: 28 mEq/L (ref 19–32)
Calcium: 9.4 mg/dL (ref 8.4–10.5)
Chloride: 101 mEq/L (ref 96–112)
Creatinine, Ser: 0.77 mg/dL (ref 0.40–1.20)
GFR: 74.62 mL/min (ref >60.00)
Glucose, Bld: 88 mg/dL (ref 70–99)
Potassium: 5.1 mEq/L (ref 3.5–5.1)
Sodium: 134 mEq/L — ABNORMAL LOW (ref 135–145)
Total Bilirubin: 0.6 mg/dL (ref 0.2–1.2)
Total Protein: 6.4 g/dL (ref 6.0–8.3)

## 2019-03-26 LAB — CBC WITH DIFFERENTIAL/PLATELET
Basophils Absolute: 0 10*3/uL (ref 0.0–0.1)
Basophils Relative: 0.4 % (ref 0.0–3.0)
Eosinophils Absolute: 0 10*3/uL (ref 0.0–0.7)
Eosinophils Relative: 1 % (ref 0.0–5.0)
HCT: 39.4 % (ref 36.0–46.0)
Hemoglobin: 13.4 g/dL (ref 12.0–15.0)
Lymphocytes Relative: 26.8 % (ref 12.0–46.0)
Lymphs Abs: 1 10*3/uL (ref 0.7–4.0)
MCHC: 34.1 g/dL (ref 30.0–36.0)
MCV: 92.1 fl (ref 78.0–100.0)
Monocytes Absolute: 0.3 10*3/uL (ref 0.1–1.0)
Monocytes Relative: 8.3 % (ref 3.0–12.0)
Neutro Abs: 2.3 10*3/uL (ref 1.4–7.7)
Neutrophils Relative %: 63.5 % (ref 43.0–77.0)
Platelets: 300 10*3/uL (ref 150.0–400.0)
RBC: 4.27 Mil/uL (ref 3.87–5.11)
RDW: 13.8 % (ref 11.5–15.5)
WBC: 3.6 10*3/uL — ABNORMAL LOW (ref 4.0–10.5)

## 2019-03-26 LAB — LIPID PANEL
Cholesterol: 213 mg/dL — ABNORMAL HIGH (ref 0–200)
HDL: 89.3 mg/dL (ref >39.00)
LDL Cholesterol: 113 mg/dL — ABNORMAL HIGH (ref 0–99)
NonHDL: 124.11
Total CHOL/HDL Ratio: 2
Triglycerides: 55 mg/dL (ref 0.0–149.0)
VLDL: 11 mg/dL (ref 0.0–40.0)

## 2019-03-26 LAB — TSH: TSH: 1.02 u[IU]/mL (ref 0.35–4.50)

## 2019-03-26 LAB — VITAMIN D 25 HYDROXY (VIT D DEFICIENCY, FRACTURES): VITD: 43.72 ng/mL (ref 30.00–100.00)

## 2019-03-29 DIAGNOSIS — Z85828 Personal history of other malignant neoplasm of skin: Secondary | ICD-10-CM | POA: Diagnosis not present

## 2019-03-29 DIAGNOSIS — D2239 Melanocytic nevi of other parts of face: Secondary | ICD-10-CM | POA: Diagnosis not present

## 2019-03-29 DIAGNOSIS — D2261 Melanocytic nevi of right upper limb, including shoulder: Secondary | ICD-10-CM | POA: Diagnosis not present

## 2019-03-29 DIAGNOSIS — L853 Xerosis cutis: Secondary | ICD-10-CM | POA: Diagnosis not present

## 2019-03-29 DIAGNOSIS — D2372 Other benign neoplasm of skin of left lower limb, including hip: Secondary | ICD-10-CM | POA: Diagnosis not present

## 2019-03-29 DIAGNOSIS — L820 Inflamed seborrheic keratosis: Secondary | ICD-10-CM | POA: Diagnosis not present

## 2019-03-29 DIAGNOSIS — L821 Other seborrheic keratosis: Secondary | ICD-10-CM | POA: Diagnosis not present

## 2019-03-29 DIAGNOSIS — D225 Melanocytic nevi of trunk: Secondary | ICD-10-CM | POA: Diagnosis not present

## 2019-03-29 DIAGNOSIS — L814 Other melanin hyperpigmentation: Secondary | ICD-10-CM | POA: Diagnosis not present

## 2019-03-29 DIAGNOSIS — D2262 Melanocytic nevi of left upper limb, including shoulder: Secondary | ICD-10-CM | POA: Diagnosis not present

## 2019-03-29 DIAGNOSIS — D2272 Melanocytic nevi of left lower limb, including hip: Secondary | ICD-10-CM | POA: Diagnosis not present

## 2019-03-29 DIAGNOSIS — D2271 Melanocytic nevi of right lower limb, including hip: Secondary | ICD-10-CM | POA: Diagnosis not present

## 2019-03-30 ENCOUNTER — Ambulatory Visit (INDEPENDENT_AMBULATORY_CARE_PROVIDER_SITE_OTHER): Payer: Medicare Other | Admitting: Sports Medicine

## 2019-03-30 ENCOUNTER — Encounter: Payer: Self-pay | Admitting: Sports Medicine

## 2019-03-30 ENCOUNTER — Other Ambulatory Visit: Payer: Self-pay

## 2019-03-30 ENCOUNTER — Ambulatory Visit (INDEPENDENT_AMBULATORY_CARE_PROVIDER_SITE_OTHER): Payer: Medicare Other

## 2019-03-30 VITALS — BP 104/70 | HR 61 | Temp 97.8°F | Ht 64.0 in | Wt 119.2 lb

## 2019-03-30 DIAGNOSIS — M545 Low back pain, unspecified: Secondary | ICD-10-CM

## 2019-03-30 DIAGNOSIS — M1812 Unilateral primary osteoarthritis of first carpometacarpal joint, left hand: Secondary | ICD-10-CM

## 2019-03-30 DIAGNOSIS — M24559 Contracture, unspecified hip: Secondary | ICD-10-CM | POA: Diagnosis not present

## 2019-03-30 DIAGNOSIS — G8929 Other chronic pain: Secondary | ICD-10-CM

## 2019-03-30 DIAGNOSIS — M5136 Other intervertebral disc degeneration, lumbar region: Secondary | ICD-10-CM | POA: Diagnosis not present

## 2019-03-30 DIAGNOSIS — M79645 Pain in left finger(s): Secondary | ICD-10-CM

## 2019-03-30 MED ORDER — DICLOFENAC SODIUM 2 % TD SOLN: 1.0000 "application " | Freq: Two times a day (BID) | TRANSDERMAL | 2 refills | Status: DC

## 2019-03-30 MED ORDER — DICLOFENAC SODIUM 2 % TD SOLN: 1.0000 "application " | Freq: Two times a day (BID) | TRANSDERMAL | 0 refills | Status: AC

## 2019-03-30 MED ORDER — DICLOFENAC SODIUM 1 % TD GEL: TRANSDERMAL | 1 refills | Status: AC

## 2019-03-30 NOTE — Progress Notes (Signed)
Allison [name]. , [facility] [place]  Allison [name] - 68 y.o. female MRN [id number]  Date of birth: 1951-02-09  Visit Date: March 30, 2019  PCP: Briscoe Deutscher, DO   Referred by: Briscoe Deutscher, DO  SUBJECTIVE:   Chief Complaint  Patient presents with  . New Patient (Initial Visit)    Low back pain w/ osteoporosis.  Ref by Juleen China    HPI: Patient is here for 2 separate issues.  She does report chronic low back pain that is been present for quite some time.  This is actually preventing her from running.  She has done physical therapy as well as worked with chiropractor in the past with mild to moderate improvement in her symptoms.  She describes this is a mild to moderate aching and tightness in her low back and occasionally into the left shin.  It is worsened with forward flexion and with getting dressed.  She has had 2 prior epidurals due to the back pain that did provide significant long-lasting relief for her.  The symptoms at this time are less than in the past.  REVIEW OF SYSTEMS: Night time disturbances: Denies Fevers, chills and night sweats: Denies Unexplained weight loss: Denies Personal history of cancer: Reports, Breast cancer survivor, left breast Changes in bowel or bladder habits: Denies Recent unreported falls: Denies New or worsening dyspnea or wheezing: Denies Headaches and dizziness: Denies Numbness, tingling and weakness in the extremities: Denies Dizziness or presyncopal episodes: Denies Lower extremity edema: Denies  HISTORY:  Prior history reviewed and updated per electronic medical record.  Patient Active Problem List   Diagnosis Date Noted  . Osteoporosis without current pathological fracture 03/23/2019    Will start Prolia when available. Discussed weight bearing exercises and vitamin D supplement.   Marland Kitchen Dysthymia 03/23/2019    Stable on Zoloft 25 mg q hs. Continue.    . Back pain  03/23/2019    Hx of bulging disc. Has been seeing Dr. Rhys Martini, would like to see Dr. Paulla Fore.   Marland Kitchen Hx of breast cancer, left    Social History   Occupational History  . Not on file  Tobacco Use  . Smoking status: Former Smoker    Last attempt to quit: 12/27/1972    Years since quitting: 46.2  . Smokeless tobacco: Never Used  Substance and Sexual Activity  . Alcohol use: Yes  . Drug use: Not on file  . Sexual activity: Yes   Social History   Social History Narrative  . Not on file   Past Medical History:  Diagnosis Date  . Chicken pox   . Depression   . Diverticulitis   . Personal history of chemotherapy   . Personal history of radiation therapy   . UTI (urinary tract infection)    Past Surgical History:  Procedure Laterality Date  . ABDOMINAL HYSTERECTOMY    . BREAST BIOPSY Left 1999  . BREAST LUMPECTOMY Left 2000  . TONSILECTOMY/ADENOIDECTOMY WITH MYRINGOTOMY     family history includes Alcohol abuse in her brother, father, and mother; Cancer in her brother and mother; Depression in her father; Early death in her father. There is no history of Breast cancer. Current Outpatient Medications on File Prior to Visit  Medication Sig Dispense Refill  . alendronate (FOSAMAX) 70 MG tablet Take one tab by mouth once weekly on empty stomach with a full glass of water 12 tablet 1  . Calcium Citrate-Vitamin D (CALCIUM +  D PO) Take by mouth.    Marland Kitchen MAGNESIUM PO Take by mouth.    Marland Kitchen NIACIN PO Take by mouth.    Marland Kitchen OVER THE COUNTER MEDICATION Chinese herbal supplement    . sertraline (ZOLOFT) 25 MG tablet Take 1 tablet (25 mg total) by mouth daily. 90 tablet 1   No current facility-administered medications on file prior to visit.     OBJECTIVE:  VS:  HT:5\' 4"  (162.6 cm)   WT:119 lb 3.2 oz (54.1 kg)  BMI:20.45    BP:104/70  HR:61bpm  TEMP:97.8 F (36.6 C)( )  RESP:99 %   PHYSICAL EXAM: CONSTITUTIONAL: Well-developed, Well-nourished and In no acute distress EYES: Pupils are  equal., EOM intact without nystagmus. and No scleral icterus. Psychiatric: Alert & appropriately interactive. and Not depressed or anxious appearing. EXTREMITY EXAM: Warm and well perfused  Patient back is overall well aligned.  She walks with a normal gait.  She does have a slight anterior pelvic tilt.  She has negative straight leg raise bilaterally good internal and external rotation bilateral hips.  She is able to heel toe walk without difficulty.  Her left thumb has some generalized osteoarthritic bossing and pain with axial load of the CMC joint.  She has good opposition, flexion, extension, abduction and adduction of the thumb   ASSESSMENT:   1. Chronic right-sided low back pain, unspecified whether sciatica present   2. Pain of left thumb   3. Primary osteoarthritis of first carpometacarpal joint of left hand   4. Hip flexor tightness, unspecified laterality     PROCEDURES:  PROCEDURE NOTE: THERAPEUTIC EXERCISES (58099)  Discussed the foundation of treatment for this condition is physical therapy and/or daily (5-6 days/week) therapeutic exercises, focusing on core strengthening, coordination, neuromuscular control/reeducation. 15 minutes spent for Therapeutic exercises as below and as referenced in the AVS. This included exercises focusing on stretching, strengthening, with significant focus on eccentric aspects.  Proper technique shown and discussed handout in great detail with ATC. All questions were discussed and answered.  Long term goals include an improvement in range of motion, strength, endurance as well as avoiding reinjury. Frequency of visits is one time as determined during today's office visit. Frequency of exercises to be performed is as per handout. EXERCISES REVIEWED: Archie Balboa Exercises Hip ABduction strengthening with focus on Glute Medius Recruitment Hip flexor stretching     PLAN:  Pertinent additional documentation may be included in corresponding procedure  notes, imaging studies, problem based documentation and patient instructions.  No problem-specific Assessment & Plan notes found for this encounter.   Patient does have some underlying arthritis of her Superior joint and this was reviewed on x-ray today.  Topical diclofenac provided.  Can consider injection therapy in the future  Low back is functionally tight.  No significant neural tension signs but she does have grade 1 spondylolisthesis of L4 on L5.  Presenter, broadcasting Program: These exercises were developed by Minerva Ends, DC with a strong emphasis on core neuromuscular reducation and postural realignment through body-weight exercises. Daily practice encouarged and links to [employer] provided today per Patient Instructions.    Home Therapeutic Exercises: New program prescribed today per procedure note.  Discussed the underlying features of tight hip flexors leading to crouched, fetal like position that results in spinal column compression.  Including lumbar hyperflexion with hypermobility, thoracic flexion with restrictive rotation and cervical lordosis reversal.   We did discuss the option for gabapentinoids but at this point she does not feel it  is necessary.  Activity modifications and the importance of avoiding exacerbating activities (limiting pain to no more than a 4 / 10 during or following activity) recommended and discussed.   Discussed red flag symptoms that warrant earlier emergent evaluation and patient voices understanding.    Meds ordered this encounter  Medications  . Diclofenac Sodium (PENNSAID) 2 % SOLN    Sig: Place 1 application onto the skin 2 (two) times daily for 1 day.    Dispense:  8 g    Refill:  0  . DISCONTD: Diclofenac Sodium (PENNSAID) 2 % SOLN    Sig: Place 1 application onto the skin 2 (two) times daily.    Dispense:  112 g    Refill:  2    Home Phone      [phone] Mobile          [phone]   . diclofenac sodium (VOLTAREN) 1  % GEL    Sig: Apply 2 grams topically to affected area qid    Dispense:  100 g    Refill:  1   Lab Orders  No laboratory test(s) ordered today    Imaging Orders     DG Lumbar Spine Complete     DG Finger Thumb Left Referral Orders  No referral(s) requested today    At follow up will plan to consider: initial osteopathic manipulation  Return in about 4 weeks (around 04/27/2019) for Low back pain and OMT.          Gerda Diss, [place] [facility] Physician

## 2019-03-30 NOTE — Patient Instructions (Addendum)
Please perform the exercise program that we have prepared for you and gone over in detail on a daily basis.  In addition to the handout you were provided you can access your program through: [email or web address]   Your unique program code is:  [email or web address]    Also check out [employer]" which is a program developed by Dr. Minerva Ends.   There are links to a couple of his YouTube Videos below and I would like to see you performing one of his videos 5-6 days per week.  It is best to do these exercises first thing in the morning.  They will give you a good jumpstart here today and start normalizing the way you move.  A good intro video is: "Independence from Pain 7-minute Video" - [email or web address]   A more advanced video is: Interior and spatial designer original 12 minutes" - [email or web address].king-[email or web address]/  Exercises that focus more on the neck are as below: Dr. Archie Balboa with [place] teaching neck and shoulder details Part 1 - https://youtu.be/[email or web address] Part 2 Dr. Archie Balboa with [facility] quick routine to practice daily - https://youtu.be/[email or web address]  Do not try to attempt the entire video when first beginning.  Try breaking of each exercise that he goes into shorter segments.  In other words, if they perform an exercise for 45 seconds, start with 15 seconds and rest and then resume when they begin the new activity.  If you work your way up to being able to do these videos without having to stop, I expect you will see significant improvements in your pain.  If you enjoy his videos and would like to find out more you can look on his website: [email or web address]  He has a workout streaming option as well as a DVD set available for purchase.  Amazon has the best price for his DVDs.     Pennsaid instructions: You have been given a sample/prescription for Pennsaid, a topical medication.     You are to apply this gel to your injured body  part twice daily (morning and evening).   A little goes a long way so you can use about a pea-sized amount for each area.   Spread this small amount over the area into a thin film and let it dry.   Be sure that you do not rub the gel into your skin for more than 10 or 15 seconds otherwise it can irritate you skin.    Once you apply the gel, please do not put any other lotion or clothing in contact with that area for 30 minutes to allow the gel to absorb into your skin.   Some people are sensitive to the medication and can develop a sunburn-like rash.  If you have only mild symptoms it is okay to continue to use the medication but if you have any breakdown of your skin you should discontinue its use and please let us know.   If you have been written a prescription for Pennsaid, you will receive a pump bottle of this topical gel through a mail order pharmacy.  The instructions on the bottle will say to apply two pumps twice a day which may be too much gel for your particular area so use the pea-sized amount as your guide.   Instructions for Duexis, Pennsaid and Vimovo:  Your prescription will be filled through a participating [facility] mail order pharmacy.  You will receive a phone call or text from one of the participating pharmacies which can be located in any state  in the [place].  You must communicate directly with them to have this medication filled.  When the pharmacy contacts you, they will need your mailing address (for shipment of the medication) andy they will need payment information if you have a copay (typically no more than $10). If you have not heard from them 2-3 days after your appointment with Dr. Paulla Fore, contact [facility] directly at [phone].

## 2019-04-02 ENCOUNTER — Encounter: Payer: Self-pay | Admitting: Sports Medicine

## 2019-04-02 NOTE — Telephone Encounter (Signed)
Rx was sent to pharmacy. 

## 2019-04-25 ENCOUNTER — Encounter: Payer: Self-pay | Admitting: Sports Medicine

## 2019-04-27 ENCOUNTER — Ambulatory Visit: Payer: BLUE CROSS/BLUE SHIELD | Admitting: Sports Medicine

## 2019-05-07 ENCOUNTER — Encounter: Payer: Self-pay | Admitting: Family Medicine

## 2019-05-10 ENCOUNTER — Encounter: Payer: Self-pay | Admitting: Family Medicine

## 2019-05-14 DIAGNOSIS — H2513 Age-related nuclear cataract, bilateral: Secondary | ICD-10-CM | POA: Diagnosis not present

## 2019-05-14 DIAGNOSIS — H5213 Myopia, bilateral: Secondary | ICD-10-CM | POA: Diagnosis not present

## 2019-05-14 DIAGNOSIS — H04123 Dry eye syndrome of bilateral lacrimal glands: Secondary | ICD-10-CM | POA: Diagnosis not present

## 2019-05-16 ENCOUNTER — Encounter: Payer: Self-pay | Admitting: Family Medicine

## 2019-05-18 NOTE — Telephone Encounter (Signed)
Called pt and advised that we have no documentation of DM, pre-diabetes, or insuline resistance. She thinks they must have just documented something wrong. No further action required.

## 2019-05-28 DIAGNOSIS — H9313 Tinnitus, bilateral: Secondary | ICD-10-CM | POA: Diagnosis not present

## 2019-05-28 DIAGNOSIS — H903 Sensorineural hearing loss, bilateral: Secondary | ICD-10-CM | POA: Diagnosis not present

## 2019-06-18 DIAGNOSIS — Z1159 Encounter for screening for other viral diseases: Secondary | ICD-10-CM | POA: Diagnosis not present

## 2019-08-27 ENCOUNTER — Encounter: Payer: Self-pay | Admitting: Family Medicine

## 2019-08-29 DIAGNOSIS — M9905 Segmental and somatic dysfunction of pelvic region: Secondary | ICD-10-CM | POA: Diagnosis not present

## 2019-08-29 DIAGNOSIS — M4716 Other spondylosis with myelopathy, lumbar region: Secondary | ICD-10-CM | POA: Diagnosis not present

## 2019-08-29 DIAGNOSIS — M9902 Segmental and somatic dysfunction of thoracic region: Secondary | ICD-10-CM | POA: Diagnosis not present

## 2019-08-29 DIAGNOSIS — M9903 Segmental and somatic dysfunction of lumbar region: Secondary | ICD-10-CM | POA: Diagnosis not present

## 2019-09-03 ENCOUNTER — Encounter: Payer: Self-pay | Admitting: Family Medicine

## 2019-09-04 ENCOUNTER — Ambulatory Visit (INDEPENDENT_AMBULATORY_CARE_PROVIDER_SITE_OTHER): Payer: Medicare Other

## 2019-09-04 ENCOUNTER — Ambulatory Visit (INDEPENDENT_AMBULATORY_CARE_PROVIDER_SITE_OTHER): Payer: Medicare Other | Admitting: Family Medicine

## 2019-09-04 ENCOUNTER — Encounter: Payer: Self-pay | Admitting: Family Medicine

## 2019-09-04 ENCOUNTER — Other Ambulatory Visit: Payer: Self-pay

## 2019-09-04 VITALS — BP 130/82 | HR 50 | Temp 97.8°F | Ht 64.0 in | Wt 118.0 lb

## 2019-09-04 DIAGNOSIS — M810 Age-related osteoporosis without current pathological fracture: Secondary | ICD-10-CM

## 2019-09-04 DIAGNOSIS — Z1159 Encounter for screening for other viral diseases: Secondary | ICD-10-CM | POA: Diagnosis not present

## 2019-09-04 DIAGNOSIS — M5441 Lumbago with sciatica, right side: Secondary | ICD-10-CM

## 2019-09-04 DIAGNOSIS — M1611 Unilateral primary osteoarthritis, right hip: Secondary | ICD-10-CM | POA: Diagnosis not present

## 2019-09-04 DIAGNOSIS — Z9189 Other specified personal risk factors, not elsewhere classified: Secondary | ICD-10-CM

## 2019-09-04 DIAGNOSIS — Z23 Encounter for immunization: Secondary | ICD-10-CM

## 2019-09-04 DIAGNOSIS — M25551 Pain in right hip: Secondary | ICD-10-CM | POA: Diagnosis not present

## 2019-09-04 DIAGNOSIS — G8929 Other chronic pain: Secondary | ICD-10-CM | POA: Diagnosis not present

## 2019-09-04 DIAGNOSIS — Z0000 Encounter for general adult medical examination without abnormal findings: Secondary | ICD-10-CM

## 2019-09-04 DIAGNOSIS — E7800 Pure hypercholesterolemia, unspecified: Secondary | ICD-10-CM

## 2019-09-04 DIAGNOSIS — Z853 Personal history of malignant neoplasm of breast: Secondary | ICD-10-CM | POA: Diagnosis not present

## 2019-09-04 DIAGNOSIS — Z13220 Encounter for screening for lipoid disorders: Secondary | ICD-10-CM | POA: Diagnosis not present

## 2019-09-04 DIAGNOSIS — M545 Low back pain: Secondary | ICD-10-CM | POA: Diagnosis not present

## 2019-09-04 LAB — COMPREHENSIVE METABOLIC PANEL
ALT: 13 U/L (ref 0–35)
AST: 23 U/L (ref 0–37)
Albumin: 4.4 g/dL (ref 3.5–5.2)
Alkaline Phosphatase: 39 U/L (ref 39–117)
BUN: 11 mg/dL (ref 6–23)
CO2: 25 mEq/L (ref 19–32)
Calcium: 9.4 mg/dL (ref 8.4–10.5)
Chloride: 97 mEq/L (ref 96–112)
Creatinine, Ser: 0.64 mg/dL (ref 0.40–1.20)
GFR: 92.24 mL/min (ref >60.00)
Glucose, Bld: 78 mg/dL (ref 70–99)
Potassium: 5 mEq/L (ref 3.5–5.1)
Sodium: 132 mEq/L — ABNORMAL LOW (ref 135–145)
Total Bilirubin: 0.8 mg/dL (ref 0.2–1.2)
Total Protein: 7 g/dL (ref 6.0–8.3)

## 2019-09-04 LAB — LIPID PANEL
Cholesterol: 225 mg/dL — ABNORMAL HIGH (ref 0–200)
HDL: 90.8 mg/dL (ref >39.00)
LDL Cholesterol: 119 mg/dL — ABNORMAL HIGH (ref 0–99)
NonHDL: 134.37
Total CHOL/HDL Ratio: 2
Triglycerides: 75 mg/dL (ref 0.0–149.0)
VLDL: 15 mg/dL (ref 0.0–40.0)

## 2019-09-04 LAB — CBC WITH DIFFERENTIAL/PLATELET
Basophils Absolute: 0 10*3/uL (ref 0.0–0.1)
Basophils Relative: 0.3 % (ref 0.0–3.0)
Eosinophils Absolute: 0.1 10*3/uL (ref 0.0–0.7)
Eosinophils Relative: 0.9 % (ref 0.0–5.0)
HCT: 42.5 % (ref 36.0–46.0)
Hemoglobin: 14.2 g/dL (ref 12.0–15.0)
Lymphocytes Relative: 21.6 % (ref 12.0–46.0)
Lymphs Abs: 1.3 10*3/uL (ref 0.7–4.0)
MCHC: 33.3 g/dL (ref 30.0–36.0)
MCV: 94 fl (ref 78.0–100.0)
Monocytes Absolute: 0.4 10*3/uL (ref 0.1–1.0)
Monocytes Relative: 6.9 % (ref 3.0–12.0)
Neutro Abs: 4.2 10*3/uL (ref 1.4–7.7)
Neutrophils Relative %: 70.3 % (ref 43.0–77.0)
Platelets: 339 10*3/uL (ref 150.0–400.0)
RBC: 4.52 Mil/uL (ref 3.87–5.11)
RDW: 14 % (ref 11.5–15.5)
WBC: 5.9 10*3/uL (ref 4.0–10.5)

## 2019-09-04 NOTE — Telephone Encounter (Signed)
Added to patient notes

## 2019-09-04 NOTE — Patient Instructions (Signed)
Health Maintenance Due  Topic Date Due  . Hepatitis C Screening  10/09/1951  . INFLUENZA VACCINE  07/28/2019    Depression screen PHQ 2/9 03/23/2019  Decreased Interest 0  Down, Depressed, Hopeless 0  PHQ - 2 Score 0  Altered sleeping 0  Tired, decreased energy 0  Change in appetite 0  Feeling bad or failure about yourself  0  Trouble concentrating 0  Moving slowly or fidgety/restless 0  Suicidal thoughts 0  PHQ-9 Score 0  Difficult doing work/chores Not difficult at all

## 2019-09-04 NOTE — Progress Notes (Signed)
Allison [name] is a 68 y.o. female is here for follow up.  History of Present Illness:   Allison [name], RMA, acting as scribe for Dr. Briscoe [name].   HPI:   Low back and right hip pain. Hx of back pain, but worsened after running during a medical emergency (helping her husband). Pain low back, right side, with radiation to groin and right leg. Hip pain seems independent. Seeing chiropractor. Hx of osteoporosis.   Health Maintenance Due  Topic Date Due  . Hepatitis C Screening  [birth year]   Depression screen [facility] 2/9 09/04/2019 09/04/2019 03/23/2019  Decreased Interest 0 0 0  Down, Depressed, Hopeless 0 0 0  PHQ - 2 Score 0 0 0  Altered sleeping 0 0 0  Tired, decreased energy 0 0 0  Change in appetite 0 0 0  Feeling bad or failure about yourself  0 0 0  Trouble concentrating 0 0 0  Moving slowly or fidgety/restless 0 0 0  Suicidal thoughts 0 0 0  PHQ-9 Score 0 0 0  Difficult doing work/chores Not difficult at all Not difficult at all Not difficult at all   PMHx, SurgHx, SocialHx, FamHx, Medications, and Allergies were reviewed in the Visit Navigator and updated as appropriate.   Patient Active Problem List   Diagnosis Date Noted  . Osteoporosis without current pathological fracture 03/23/2019  . Dysthymia 03/23/2019  . Back pain 03/23/2019  . Hx of breast cancer, left    Social History   Tobacco Use  . Smoking status: Former Smoker    Quit date: 12/27/1972    Years since quitting: 46.7  . Smokeless tobacco: Never Used  Substance Use Topics  . Alcohol use: Yes  . Drug use: Not on file   Current Medications and Allergies   Current Outpatient Medications:  .  alendronate (FOSAMAX) 70 MG tablet, Take one tab by mouth once weekly on empty stomach with a full glass of water, Disp: 12 tablet, Rfl: 1 .  Calcium Citrate-Vitamin D (CALCIUM + D PO), Take by mouth., Disp: , Rfl:  .  diclofenac sodium (VOLTAREN) 1 % GEL, Apply 2 grams topically to affected area qid, Disp: 100 g,  Rfl: 1 .  MAGNESIUM PO, Take by mouth., Disp: , Rfl:  .  NIACIN PO, Take by mouth., Disp: , Rfl:  .  OVER THE COUNTER MEDICATION, Chinese herbal supplement, Disp: , Rfl:  .  sertraline (ZOLOFT) 25 MG tablet, Take 1 tablet (25 mg total) by mouth daily., Disp: 90 tablet, Rfl: 1   Allergies  Allergen Reactions  . Penicillins     Childhood allergy  . Sulfa Antibiotics Hives and Swelling   Review of Systems   Pertinent items are noted in the HPI. Otherwise, a complete ROS is negative.  Vitals   Vitals:   09/04/19 1015  BP: 130/82  Pulse: (!) 50  Temp: 97.8 F (36.6 C)  TempSrc: Temporal  SpO2: 99%  Weight: 118 lb (53.5 kg)  Height: 5\' 4"  (1.626 m)     Body mass index is 20.25 kg/m.  Physical Exam   Physical Exam Vitals signs and nursing note reviewed.  HENT:     Head: Normocephalic and atraumatic.  Eyes:     Pupils: Pupils are equal, round, and reactive to light.  Neck:     Musculoskeletal: Normal range of motion and neck supple.  Cardiovascular:     Rate and Rhythm: Normal rate and regular rhythm.     Heart sounds: Normal heart  sounds.  Pulmonary:     Effort: Pulmonary effort is normal.  Abdominal:     Palpations: Abdomen is soft.  Skin:    General: Skin is warm.  Psychiatric:        Behavior: Behavior normal.    Assessment and Plan   Allison [name] was seen today for annual exam and back pain.  Diagnoses and all orders for this visit:  Routine physical examination  Need for immunization against influenza -     Flu Vaccine QUAD High Dose(Fluad)  Osteoporosis without current pathological fracture, unspecified osteoporosis type -     CBC with Differential/Platelet  Hx of breast cancer, left -     CBC with Differential/Platelet  Pure hypercholesterolemia -     Comprehensive metabolic panel -     Lipid panel -     CT CARDIAC SCORING; Future  Right hip pain -     DG HIP UNILAT W OR W/O PELVIS 2-3 VIEWS RIGHT; Future  Chronic right-sided low back pain with  right-sided sciatica -     DG Lumbar Spine Complete -     Ambulatory referral to Sports Medicine  Encounter for hepatitis C virus screening test for high risk patient -     Hepatitis C antibody    . Orders and follow up as documented in [place], reviewed diet, exercise and weight control, cardiovascular risk and specific lipid/LDL goals reviewed, reviewed medications and side effects in detail.  . Reviewed expectations re: course of current medical issues. . Outlined signs and symptoms indicating need for more acute intervention. . Patient verbalized understanding and all questions were answered. . Patient received an After Visit Summary.  Allison Deutscher, DO [place], [facility] 09/04/2019

## 2019-09-05 ENCOUNTER — Encounter: Payer: Self-pay | Admitting: Family Medicine

## 2019-09-05 DIAGNOSIS — B354 Tinea corporis: Secondary | ICD-10-CM | POA: Diagnosis not present

## 2019-09-05 DIAGNOSIS — L820 Inflamed seborrheic keratosis: Secondary | ICD-10-CM | POA: Diagnosis not present

## 2019-09-05 DIAGNOSIS — Z85828 Personal history of other malignant neoplasm of skin: Secondary | ICD-10-CM | POA: Diagnosis not present

## 2019-09-05 LAB — HEPATITIS C ANTIBODY
Hepatitis C Ab: NONREACTIVE
SIGNAL TO CUT-OFF: 0.01 (ref <1.00)

## 2019-09-10 ENCOUNTER — Ambulatory Visit (INDEPENDENT_AMBULATORY_CARE_PROVIDER_SITE_OTHER): Payer: Medicare Other | Admitting: Family Medicine

## 2019-09-10 ENCOUNTER — Other Ambulatory Visit: Payer: Self-pay

## 2019-09-10 ENCOUNTER — Encounter: Payer: Self-pay | Admitting: Family Medicine

## 2019-09-10 DIAGNOSIS — M549 Dorsalgia, unspecified: Secondary | ICD-10-CM | POA: Diagnosis not present

## 2019-09-10 MED ORDER — VITAMIN D (ERGOCALCIFEROL) 1.25 MG (50000 UNIT) PO CAPS: 50000.0000 [IU] | ORAL_CAPSULE | ORAL | 0 refills | Status: DC

## 2019-09-10 NOTE — Patient Instructions (Addendum)
Good to see you.  Exercise 3 times a week Ice 20 minutes 2 times daily. Usually after activity and before bed. Ok to walk 3 times a week   Turmeric 500mg  daily  Tart cherry extract 1200mg  at night Once weekly vitamin d for 12 weeks See me again in 4-6 weeks we will consider manipulation

## 2019-09-10 NOTE — Progress Notes (Signed)
[name] [address] [place], [place] [address] Phone: [phone] Subjective:   I [name] am serving as a Education administrator for Dr. Hulan Saas.  I'm seeing this patient by the request  of:  Briscoe Deutscher, DO   CC: Low back pain  [id number]  Allison [name] is a 68 y.o. female coming in with complaint of right sided back and hip pain. States she doesn't recall an injury. Herniated disc. Ran to help her husband during a dehydration episode and started feeling pain after that. States she is active and used to run all the time. Was instructed not to run anymore since herniated disc diagnosis. Started feeling like shin splints in the left leg.   Onset- 2 weeks  Location Duration-  Character- Aggravating factors- running, sitting to standing Reliving factors-  Therapies tried- yoga Severity- at her worse 6/10   Patient did have x-rays taken 1 week ago.  X-rays were independently visualized by me.  X-rays of the lumbar spine did show a lumbosacral spondylosis at L4-L5 with a spondylolisthesis and facet arthropathy  Past Medical History:  Diagnosis Date  . Chicken pox   . Depression   . Diverticulitis   . Personal history of chemotherapy   . Personal history of radiation therapy   . UTI (urinary tract infection)    Past Surgical History:  Procedure Laterality Date  . ABDOMINAL HYSTERECTOMY    . BREAST BIOPSY Left 1999  . BREAST LUMPECTOMY Left 2000  . TONSILECTOMY/ADENOIDECTOMY WITH MYRINGOTOMY     Social History   Socioeconomic History  . Marital status: Married    Spouse name: Not on file  . Number of children: Not on file  . Years of education: Not on file  . Highest education level: Not on file  Occupational History  . Not on file  Social Needs  . Financial resource strain: Not on file  . Food insecurity    Worry: Not on file    Inability: Not on file  . Transportation needs    Medical: Not on file    Non-medical: Not on file   Tobacco Use  . Smoking status: Former Smoker    Quit date: 12/27/1972    Years since quitting: 46.7  . Smokeless tobacco: Never Used  Substance and Sexual Activity  . Alcohol use: Yes  . Drug use: Not on file  . Sexual activity: Yes  Lifestyle  . Physical activity    Days per week: 4 days    Minutes per session: Not on file  . Stress: Not on file  Relationships  . Social Herbalist on phone: Not on file    Gets together: Not on file    Attends religious service: Not on file    Active member of club or organization: Not on file    Attends meetings of clubs or organizations: Not on file    Relationship status: Not on file  Other Topics Concern  . Not on file  Social History Narrative  . Not on file   Allergies  Allergen Reactions  . Penicillins     Childhood allergy  . Sulfa Antibiotics Hives and Swelling   Family History  Problem Relation Age of Onset  . Alcohol abuse Mother   . Cancer Mother   . Alcohol abuse Father   . Depression Father   . Early death Father   . Alcohol abuse Brother   . Cancer Brother   . Breast  cancer Neg Hx     Current Outpatient Medications (Endocrine & Metabolic):  .  alendronate (FOSAMAX) 70 MG tablet, Take one tab by mouth once weekly on empty stomach with a full glass of water      Current Outpatient Medications (Other):  Marland Kitchen  Calcium Citrate-Vitamin D (CALCIUM + D PO), Take by mouth. .  diclofenac sodium (VOLTAREN) 1 % GEL, Apply 2 grams topically to affected area qid .  MAGNESIUM PO, Take by mouth. Marland Kitchen  NIACIN PO, Take by mouth. Marland Kitchen  OVER THE COUNTER MEDICATION, Chinese herbal supplement .  sertraline (ZOLOFT) 25 MG tablet, Take 1 tablet (25 mg total) by mouth daily. .  Vitamin D, Ergocalciferol, (DRISDOL) 1.25 MG (50000 UT) CAPS capsule, Take 1 capsule (50,000 Units total) by mouth every 7 (seven) days.    Past medical history, social, surgical and family history all reviewed in electronic medical record.  No pertanent  information unless stated regarding to the chief complaint.   Review of Systems:  No headache, visual changes, nausea, vomiting, diarrhea, constipation, dizziness, abdominal pain, skin rash, fevers, chills, night sweats, weight loss, swollen lymph nodes, body aches, joint swelling, muscle aches, chest pain, shortness of breath, mood changes.   Objective  Blood pressure 114/70, pulse (!) 57, height 5\' 4"  (1.626 m), weight 118 lb (53.5 kg), SpO2 98 %. Systems examined below as of    General: No apparent distress alert and oriented x3 mood and affect normal, dressed appropriately.  HEENT: Pupils equal, extraocular movements intact  Respiratory: Patient's speak in full sentences and does not appear short of breath  Cardiovascular: No lower extremity edema, non tender, no erythema  Skin: Warm dry intact with no signs of infection or rash on extremities or on axial skeleton.  Abdomen: Soft nontender  Neuro: Cranial nerves II through XII are intact, neurovascularly intact in all extremities with 2+ DTRs and 2+ pulses.  Lymph: No lymphadenopathy of posterior or anterior cervical chain or axillae bilaterally.  Gait normal with good balance and coordination.  MSK:  Non tender with full range of motion and good stability and symmetric strength and tone of shoulders, elbows, wrist, hip, knee and ankles bilaterally.  Patient is somewhat frail  Low back exam shows the patient does have some loss of lordosis with some mild degenerative scoliosis.  Tightness with Corky Sox on the right side.  Negative straight leg test.  5 out of 5 strength in lower extremities bilaterally neurovascular intact distally.   [id number]; 15 additional minutes spent for Therapeutic exercises as stated in above notes.  This included exercises focusing on stretching, strengthening, with significant focus on eccentric aspects.   Long term goals include an improvement in range of motion, strength, endurance as well as avoiding reinjury.  Patient's frequency would include in 1-2 times a day, 3-5 times a week for a duration of 6-12 weeks. Low back exercises that included:  Pelvic tilt/bracing instruction to focus on control of the pelvic girdle and lower abdominal muscles  Glute strengthening exercises, focusing on proper firing of the glutes without engaging the low back muscles Proper stretching techniques for maximum relief for the hamstrings, hip flexors, low back and some rotation where tolerated   Proper technique shown and discussed handout in great detail with ATC.  All questions were discussed and answered.     Impression and Recommendations:     This case required medical decision making of moderate complexity. The above documentation has been reviewed and is accurate and complete Olevia Bowens  Tamala Julian, DO       Note: This dictation was prepared with Dragon dictation along with smaller phrase technology. Any transcriptional errors that result from this process are unintentional.

## 2019-09-10 NOTE — Assessment & Plan Note (Signed)
Multifactorial.  We discussed the possibility of manipulation with patient having significant tightness will hold today.  Discussed which activities of doing which wants to avoid.  Discussed icing regimen, home exercises, follow-up again 4 weeks.

## 2019-10-01 ENCOUNTER — Ambulatory Visit (INDEPENDENT_AMBULATORY_CARE_PROVIDER_SITE_OTHER)
Admission: RE | Admit: 2019-10-01 | Discharge: 2019-10-01 | Disposition: A | Discharge status: Home / Self Care | Payer: Self-pay | Source: Ambulatory Visit | Attending: Family Medicine | Admitting: Family Medicine

## 2019-10-01 ENCOUNTER — Other Ambulatory Visit: Payer: Self-pay

## 2019-10-01 DIAGNOSIS — E7800 Pure hypercholesterolemia, unspecified: Secondary | ICD-10-CM

## 2019-10-02 ENCOUNTER — Encounter: Payer: Self-pay | Admitting: Family Medicine

## 2019-10-16 ENCOUNTER — Encounter: Payer: Self-pay | Admitting: Family Medicine

## 2019-10-16 ENCOUNTER — Ambulatory Visit (INDEPENDENT_AMBULATORY_CARE_PROVIDER_SITE_OTHER): Payer: Medicare Other | Admitting: Family Medicine

## 2019-10-16 DIAGNOSIS — M549 Dorsalgia, unspecified: Secondary | ICD-10-CM

## 2019-10-16 DIAGNOSIS — M999 Biomechanical lesion, unspecified: Secondary | ICD-10-CM | POA: Diagnosis not present

## 2019-10-16 NOTE — Assessment & Plan Note (Signed)
Decision today to treat with OMT was based on Physical Exam  After verbal consent patient was treated with HVLA, ME, FPR techniques in  thoracic, lumbar and sacral areas  Patient tolerated the procedure well with improvement in symptoms  Patient given exercises, stretches and lifestyle modifications  See medications in patient instructions if given  Patient will follow up in 8 weeks 

## 2019-10-16 NOTE — Assessment & Plan Note (Signed)
Multifactorial.  Discussed with patient about icing regimen, home exercise, core stability.  Responded well to osteopathic manipulation today.  Will avoid very high impact secondary to patient's underlying osteoporosis.  Patient will follow up with me again 4 to 8 weeks.

## 2019-10-16 NOTE — Patient Instructions (Signed)
Good to see you Continue vitamin D once weekly. Once done we will check values and change to 2000 daily over the counter Work on abductor strength See me again in 6-7 weeks

## 2019-10-16 NOTE — Progress Notes (Signed)
[name] [address], [place] [address] Phone: [phone] Subjective:   I Allison [name] am serving as a Education administrator for Dr. Hulan Saas.    CC: Low back pain follow-up  [id number]  Allison [name] is a 68 y.o. female coming in with complaint of back pain. States the back is doing well. No worsening pain. States she is feeling a lot better.  Patient was staying 95% better.  Not as having a significant amount of pain that she was having previously.  Patient states daily activities have been beneficial.  Also doing home exercises on a regular basis.     Past Medical History:  Diagnosis Date  . Chicken pox   . Depression   . Diverticulitis   . Personal history of chemotherapy   . Personal history of radiation therapy   . UTI (urinary tract infection)    Past Surgical History:  Procedure Laterality Date  . ABDOMINAL HYSTERECTOMY    . BREAST BIOPSY Left 1999  . BREAST LUMPECTOMY Left 2000  . TONSILECTOMY/ADENOIDECTOMY WITH MYRINGOTOMY     Social History   Socioeconomic History  . Marital status: Married    Spouse name: Not on file  . Number of children: Not on file  . Years of education: Not on file  . Highest education level: Not on file  Occupational History  . Not on file  Social Needs  . Financial resource strain: Not on file  . Food insecurity    Worry: Not on file    Inability: Not on file  . Transportation needs    Medical: Not on file    Non-medical: Not on file  Tobacco Use  . Smoking status: Former Smoker    Quit date: 12/27/1972    Years since quitting: 46.8  . Smokeless tobacco: Never Used  Substance and Sexual Activity  . Alcohol use: Yes  . Drug use: Not on file  . Sexual activity: Yes  Lifestyle  . Physical activity    Days per week: 4 days    Minutes per session: Not on file  . Stress: Not on file  Relationships  . Social Herbalist on phone: Not on file    Gets together: Not on file   Attends religious service: Not on file    Active member of club or organization: Not on file    Attends meetings of clubs or organizations: Not on file    Relationship status: Not on file  Other Topics Concern  . Not on file  Social History Narrative  . Not on file   Allergies  Allergen Reactions  . Penicillins     Childhood allergy  . Sulfa Antibiotics Hives and Swelling   Family History  Problem Relation Age of Onset  . Alcohol abuse Mother   . Cancer Mother   . Alcohol abuse Father   . Depression Father   . Early death Father   . Alcohol abuse Brother   . Cancer Brother   . Breast cancer Neg Hx     Current Outpatient Medications (Endocrine & Metabolic):  .  alendronate (FOSAMAX) 70 MG tablet, Take one tab by mouth once weekly on empty stomach with a full glass of water      Current Outpatient Medications (Other):  Marland Kitchen  Calcium Citrate-Vitamin D (CALCIUM + D PO), Take by mouth. .  diclofenac sodium (VOLTAREN) 1 % GEL, Apply 2 grams topically to affected area qid .  MAGNESIUM  PO, Take by mouth. Marland Kitchen  NIACIN PO, Take by mouth. Marland Kitchen  OVER THE COUNTER MEDICATION, Chinese herbal supplement .  sertraline (ZOLOFT) 25 MG tablet, Take 1 tablet (25 mg total) by mouth daily. .  Vitamin D, Ergocalciferol, (DRISDOL) 1.25 MG (50000 UT) CAPS capsule, Take 1 capsule (50,000 Units total) by mouth every 7 (seven) days.    Past medical history, social, surgical and family history all reviewed in electronic medical record.  No pertanent information unless stated regarding to the chief complaint.   Review of Systems:  No headache, visual changes, nausea, vomiting, diarrhea, constipation, dizziness, abdominal pain, skin rash, fevers, chills, night sweats, weight loss, swollen lymph nodes, body aches, joint swelling, , chest pain, shortness of breath, mood changes.  Positive muscle aches  Objective  Blood pressure 138/70, pulse (!) 53, height 5\' 4"  (1.626 m), weight 118 lb (53.5 kg), SpO2 98 %.      General: No apparent distress alert and oriented x3 mood and affect normal, dressed appropriately.  HEENT: Pupils equal, extraocular movements intact  Respiratory: Patient's speak in full sentences and does not appear short of breath  Cardiovascular: No lower extremity edema, non tender, no erythema  Skin: Warm dry intact with no signs of infection or rash on extremities or on axial skeleton.  Abdomen: Soft nontender  Neuro: Cranial nerves II through XII are intact, neurovascularly intact in all extremities with 2+ DTRs and 2+ pulses.  Lymph: No lymphadenopathy of posterior or anterior cervical chain or axillae bilaterally.  Gait normal with good balance and coordination.  MSK:  Non tender with full range of motion and good stability and symmetric strength and tone of shoulders, elbows, wrist, hip, knee and ankles bilaterally.  Back Exam:  Inspection: Loss of lordosis Motion: Flexion 40 deg, Extension 25 deg, Side Bending to 35 deg bilaterally,  Rotation to 35 deg bilaterally  SLR laying: Negative  XSLR laying: Negative  Palpable tenderness: Tender to palpation minorly in the paraspinal musculature more around L1-L2 bilaterally right greater than left tightness to hip flexor noted. FABER: negative. Sensory change: Gross sensation intact to all lumbar and sacral dermatomes.  Reflexes: 2+ at both patellar tendons, 2+ at achilles tendons, Babinski's downgoing.  Strength at foot  Plantar-flexion: 5/5 Dorsi-flexion: 5/5 Eversion: 5/5 Inversion: 5/5  Leg strength  Quad: 5/5 Hamstring: 5/5 Hip flexor: 5/5 Hip abductors: 5/5   Osteopathic findings  T11 extended rotated and side bent left L1 flexed rotated and side bent right Sacrum right on right    Impression and Recommendations:     This case required medical decision making of moderate complexity. The above documentation has been reviewed and is accurate and complete Lyndal Pulley, DO       Note: This dictation was  prepared with Dragon dictation along with smaller phrase technology. Any transcriptional errors that result from this process are unintentional.

## 2019-11-02 ENCOUNTER — Ambulatory Visit (INDEPENDENT_AMBULATORY_CARE_PROVIDER_SITE_OTHER): Payer: Medicare Other | Admitting: Family Medicine

## 2019-11-02 ENCOUNTER — Other Ambulatory Visit: Payer: Self-pay

## 2019-11-02 ENCOUNTER — Encounter: Payer: Self-pay | Admitting: Family Medicine

## 2019-11-02 VITALS — BP 130/68 | HR 62 | Temp 98.0°F | Ht 64.0 in | Wt 119.6 lb

## 2019-11-02 DIAGNOSIS — F341 Dysthymic disorder: Secondary | ICD-10-CM

## 2019-11-02 DIAGNOSIS — M1611 Unilateral primary osteoarthritis, right hip: Secondary | ICD-10-CM

## 2019-11-02 DIAGNOSIS — M5126 Other intervertebral disc displacement, lumbar region: Secondary | ICD-10-CM | POA: Diagnosis not present

## 2019-11-02 DIAGNOSIS — M7742 Metatarsalgia, left foot: Secondary | ICD-10-CM

## 2019-11-02 MED ORDER — SERTRALINE HCL 25 MG PO TABS: 25.0000 mg | ORAL_TABLET | Freq: Every day | ORAL | 3 refills | Status: DC

## 2019-11-02 MED ORDER — DICLOFENAC SODIUM 75 MG PO TBEC: 75.0000 mg | DELAYED_RELEASE_TABLET | Freq: Two times a day (BID) | ORAL | 0 refills | Status: DC

## 2019-11-02 NOTE — Progress Notes (Signed)
Subjective  CC:  Chief Complaint  Patient presents with  . Transitions Of Care  . Back Pain    followed by Dr. Tamala Julian. flair up in the last week.   . Foot Pain    x 2 weeks on ball of foot. Has tried ice no help. Selling and pain     HPI: Allison [name] is a 68 y.o. female who presents to [facility] today to establish care with me as a new patient. Reviewed chart in detail. 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 Former runner, active lifestyle. 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   She has the following concerns or needs:  Chronic intermittent low back pain: h/o HNP s/p steroid injections in the past with radicular sxs and right hip arthritis; now flaring again over the last several weeks. Has been increasing walking and bicycling training for a 5k walk race. No injuries. No paresthesias or weakness. Right low back pain and ant groin pain like before.   Also metatarsal pain with walking and painful 2nd toe. No injury. No heel pain.   Assessment  1. Metatarsalgia of left foot   2. Dysthymia   3. Primary osteoarthritis of right hip   4. Chronic right-sided low back pain without sciatica      Plan   Metatarsalgia : education and treatment options discussed, start nsaid and metatarsal pad and 1-2 week of rest  Back pain: flared from activity. Nsaids, back exercises (has had PT before), and rest. To SM again if not improving.   Hip arthritis: same tx as above.   Refilled sertraline.  HM is up to date.   Follow up:  No follow-ups on file. No orders of the defined types were placed in this encounter.  Meds ordered this encounter  Medications  . sertraline (ZOLOFT) 25 MG tablet    Sig: Take 1 tablet (25 mg total) by mouth daily.    Dispense:  90 tablet    Refill:  3  . diclofenac (VOLTAREN) 75 MG EC tablet    Sig: Take 1 tablet (75 mg total) by mouth 2 (two) times daily.     Dispense:  30 tablet    Refill:  0     Depression screen [facility] 2/9 09/04/2019 09/04/2019 03/23/2019  Decreased Interest 0 0 0  Down, Depressed, Hopeless 0 0 0  PHQ - 2 Score 0 0 0  Altered sleeping 0 0 0  Tired, decreased energy 0 0 0  Change in appetite 0 0 0  Feeling bad or failure about yourself  0 0 0  Trouble concentrating 0 0 0  Moving slowly or fidgety/restless 0 0 0  Suicidal thoughts 0 0 0  PHQ-9 Score 0 0 0  Difficult doing work/chores Not difficult at all Not difficult at all Not difficult at all    We updated and reviewed the patient's past history in detail and it is documented below.  Patient Active Problem List   Diagnosis Date Noted  . Osteoporosis without current pathological fracture 03/23/2019    Priority: High  . Dysthymia 03/23/2019    Priority: High    Stable on Zoloft 25 mg q hs. Continue.    Marland Kitchen Hx of breast cancer, left     Priority: High  . Back pain 03/23/2019    Hx of bulging disc. Has been seeing Dr. Rhys Martini, would like to see Dr. Paulla Fore.    Health  Maintenance  Topic Date Due  . MAMMOGRAM  12/19/2019  . DEXA SCAN  03/15/2020  . COLONOSCOPY  12/07/2025  . TETANUS/TDAP  02/10/2026  . INFLUENZA VACCINE  Completed  . Hepatitis C Screening  Completed  . PNA vac Low Risk Adult  Completed   Immunization History  Administered Date(s) Administered  . Fluad Quad(high Dose 65+) 09/04/2019  . Influenza-Unspecified 02/07/2015, 09/02/2018  . Pneumococcal-Unspecified 03/15/2018  . Tdap 02/11/2016  . Zoster Recombinat (Shingrix) 09/07/2011, 01/06/2019, 05/08/2019   Current Meds  Medication Sig  . alendronate (FOSAMAX) 70 MG tablet Take one tab by mouth once weekly on empty stomach with a full glass of water  . Calcium Citrate-Vitamin D (CALCIUM + D PO) Take by mouth.  . diclofenac sodium (VOLTAREN) 1 % GEL Apply 2 grams topically to affected area qid  . MAGNESIUM PO Take by mouth.  Marland Kitchen NIACIN PO Take by mouth.  Marland Kitchen OVER THE COUNTER MEDICATION Chinese  herbal supplement  . sertraline (ZOLOFT) 25 MG tablet Take 1 tablet (25 mg total) by mouth daily.  . Vitamin D, Ergocalciferol, (DRISDOL) 1.25 MG (50000 UT) CAPS capsule Take 1 capsule (50,000 Units total) by mouth every 7 (seven) days.  . [DISCONTINUED] sertraline (ZOLOFT) 25 MG tablet Take 1 tablet (25 mg total) by mouth daily.    Allergies: Patient is allergic to penicillins and sulfa antibiotics. Past Medical History Patient  has a past medical history of Chicken pox, Depression, Diverticulitis, Personal history of chemotherapy, Personal history of radiation therapy, and UTI (urinary tract infection). Past Surgical History Patient  has a past surgical history that includes Breast lumpectomy (Left, 2000); Breast biopsy (Left, 1999); Tonsilectomy/adenoidectomy with myringotomy; and Abdominal hysterectomy. Family History: Patient family history includes Alcohol abuse in her brother, father, and mother; Cancer in her brother and mother; Depression in her father; Early death in her father. Social History:  Patient  reports that she quit smoking about 46 years ago. She has never used smokeless tobacco. She reports current alcohol use.  Review of Systems: Constitutional: negative for fever or malaise Ophthalmic: negative for photophobia, double vision or loss of vision Cardiovascular: negative for chest pain, dyspnea on exertion, or new LE swelling Respiratory: negative for SOB or persistent cough Gastrointestinal: negative for abdominal pain, change in bowel habits or melena Genitourinary: negative for dysuria or gross hematuria Musculoskeletal: negative for new gait disturbance or muscular weakness Integumentary: negative for new or persistent rashes Neurological: negative for TIA or stroke symptoms Psychiatric: negative for SI or delusions Allergic/Immunologic: negative for hives  Patient Care Team    Relationship Specialty Notifications Start End  [name], MD PCP - General  Family Medicine  11/02/19   Haver[place], Jennefer Bravo, MD Referring Physician Dermatology  04/17/19   Jola Schmidt, MD Consulting Physician Ophthalmology  04/17/19   Dr. Marrianne Mood, [place] Physician Dentistry  04/17/19     Objective  Vitals: BP 130/68   Pulse 62   Temp 98 F (36.7 C) (Temporal)   Ht 5\' 4"  (1.626 m)   Wt 119 lb 9.6 oz (54.3 kg)   SpO2 98%   BMI 20.53 kg/m  General:  Well developed, well nourished, no acute distress  Psych:  Alert and oriented,normal mood and affect HEENT:  Normocephalic, atraumatic, non-icteric sclera,  Cardiovascular:  RRR without gallop, rub or murmur, nondisplaced PMI Respiratory:  Good breath sounds bilaterally, CTAB with normal respiratory effort MSK: left foot: tender 2nd metatarsal head, + OA changes in 2nd toe w/o erythema or  warmth, + ttp, no heel pain/ttp, nl ankle and calf Back: FROM, no spinal ttp, no hip ttp, neg slr Skin:  Warm, no rashes or suspicious lesions noted Neurologic:    Mental status is normal. Gross motor and sensory exams are normal. Normal gait   Commons side effects, risks, benefits, and alternatives for medications and treatment plan prescribed today were discussed, and the patient expressed understanding of the given instructions. Patient is instructed to call or message via MyChart if he/she has any questions or concerns regarding our treatment plan. No barriers to understanding were identified. We discussed Red Flag symptoms and signs in detail. Patient expressed understanding regarding what to do in case of urgent or emergency type symptoms.   Medication list was reconciled, printed and provided to the patient in AVS. Patient instructions and summary information was reviewed with the patient as documented in the AVS. This note was prepared with assistance of Dragon voice recognition software. Occasional wrong-word or sound-a-like substitutions may have occurred due to the inherent limitations of voice recognition  software

## 2019-11-02 NOTE — Patient Instructions (Addendum)
Please return in Sept 2021 for your annual complete physical; please come fasting.   If you have any questions or concerns, please don't hesitate to send me a message via MyChart or call the office at [phone]. Thank you for visiting with [place] today! It's our pleasure caring for you.   Back Exercises The following exercises strengthen the muscles that help to support the trunk and back. They also help to keep the lower back flexible. Doing these exercises can help to prevent back pain or lessen existing pain.  If you have back pain or discomfort, try doing these exercises 2-3 times each day or as told by your health care provider.  As your pain improves, do them once each day, but increase the number of times that you repeat the steps for each exercise (do more repetitions).  To prevent the recurrence of back pain, continue to do these exercises once each day or as told by your health care provider. Do exercises exactly as told by your health care provider and adjust them as directed. It is normal to feel mild stretching, pulling, tightness, or discomfort as you do these exercises, but you should stop right away if you feel sudden pain or your pain gets worse. Exercises Single knee to chest Repeat these steps 3-5 times for each leg: 1. Lie on your back on a firm bed or the floor with your legs extended. 2. Bring one knee to your chest. Your other leg should stay extended and in contact with the floor. 3. Hold your knee in place by grabbing your knee or thigh with both hands and hold. 4. Pull on your knee until you feel a gentle stretch in your lower back or buttocks. 5. Hold the stretch for 10-30 seconds. 6. Slowly release and straighten your leg. Pelvic tilt Repeat these steps 5-10 times: 1. Lie on your back on a firm bed or the floor with your legs extended. 2. Bend your knees so they are pointing toward the ceiling and your feet are flat on the floor. 3. Tighten your lower abdominal  muscles to press your lower back against the floor. This motion will tilt your pelvis so your tailbone points up toward the ceiling instead of pointing to your feet or the floor. 4. With gentle tension and even breathing, hold this position for 5-10 seconds. Cat-cow Repeat these steps until your lower back becomes more flexible: 1. Get into a hands-and-knees position on a firm surface. Keep your hands under your shoulders, and keep your knees under your hips. You may place padding under your knees for comfort. 2. Let your head hang down toward your chest. Contract your abdominal muscles and point your tailbone toward the floor so your lower back becomes rounded like the back of a cat. 3. Hold this position for 5 seconds. 4. Slowly lift your head, let your abdominal muscles relax and point your tailbone up toward the ceiling so your back forms a sagging arch like the back of a cow. 5. Hold this position for 5 seconds.  Press-ups Repeat these steps 5-10 times: 1. Lie on your abdomen (face-down) on the floor. 2. Place your palms near your head, about shoulder-width apart. 3. Keeping your back as relaxed as possible and keeping your hips on the floor, slowly straighten your arms to raise the top half of your body and lift your shoulders. Do not use your back muscles to raise your upper torso. You may adjust the placement of your hands to make  yourself more comfortable. 4. Hold this position for 5 seconds while you keep your back relaxed. 5. Slowly return to lying flat on the floor.  Bridges Repeat these steps 10 times: 1. Lie on your back on a firm surface. 2. Bend your knees so they are pointing toward the ceiling and your feet are flat on the floor. Your arms should be flat at your sides, next to your body. 3. Tighten your buttocks muscles and lift your buttocks off the floor until your waist is at almost the same height as your knees. You should feel the muscles working in your buttocks and the  back of your thighs. If you do not feel these muscles, slide your feet 1-2 inches farther away from your buttocks. 4. Hold this position for 3-5 seconds. 5. Slowly lower your hips to the starting position, and allow your buttocks muscles to relax completely. If this exercise is too easy, try doing it with your arms crossed over your chest. Abdominal crunches Repeat these steps 5-10 times: 1. Lie on your back on a firm bed or the floor with your legs extended. 2. Bend your knees so they are pointing toward the ceiling and your feet are flat on the floor. 3. Cross your arms over your chest. 4. Tip your chin slightly toward your chest without bending your neck. 5. Tighten your abdominal muscles and slowly raise your trunk (torso) high enough to lift your shoulder blades a tiny bit off the floor. Avoid raising your torso higher than that because it can put too much stress on your low back and does not help to strengthen your abdominal muscles. 6. Slowly return to your starting position. Back lifts Repeat these steps 5-10 times: 1. Lie on your abdomen (face-down) with your arms at your sides, and rest your forehead on the floor. 2. Tighten the muscles in your legs and your buttocks. 3. Slowly lift your chest off the floor while you keep your hips pressed to the floor. Keep the back of your head in line with the curve in your back. Your eyes should be looking at the floor. 4. Hold this position for 3-5 seconds. 5. Slowly return to your starting position. Contact a health care provider if:  Your back pain or discomfort gets much worse when you do an exercise.  Your worsening back pain or discomfort does not lessen within 2 hours after you exercise. If you have any of these problems, stop doing these exercises right away. Do not do them again unless your health care provider says that you can. Get help right away if:  You develop sudden, severe back pain. If this happens, stop doing the exercises  right away. Do not do them again unless your health care provider says that you can. This information is not intended to replace advice given to you by your health care provider. Make sure you discuss any questions you have with your health care provider. Document Released: 01/20/2005 Document Revised: 04/19/2019 Document Reviewed: 09/14/2018 Elsevier Patient Education  2020 [employer].

## 2019-11-07 ENCOUNTER — Other Ambulatory Visit: Payer: Self-pay | Admitting: Family Medicine

## 2019-11-07 DIAGNOSIS — Z1231 Encounter for screening mammogram for malignant neoplasm of breast: Secondary | ICD-10-CM

## 2019-11-28 ENCOUNTER — Ambulatory Visit (INDEPENDENT_AMBULATORY_CARE_PROVIDER_SITE_OTHER): Payer: Medicare Other | Admitting: Family Medicine

## 2019-11-28 ENCOUNTER — Encounter: Payer: Self-pay | Admitting: Family Medicine

## 2019-11-28 DIAGNOSIS — M5126 Other intervertebral disc displacement, lumbar region: Secondary | ICD-10-CM | POA: Diagnosis not present

## 2019-11-28 DIAGNOSIS — M1611 Unilateral primary osteoarthritis, right hip: Secondary | ICD-10-CM | POA: Diagnosis not present

## 2019-11-28 DIAGNOSIS — M999 Biomechanical lesion, unspecified: Secondary | ICD-10-CM

## 2019-11-28 MED ORDER — GABAPENTIN 100 MG PO CAPS: 200.0000 mg | ORAL_CAPSULE | Freq: Every day | ORAL | 0 refills | Status: DC

## 2019-11-28 NOTE — Assessment & Plan Note (Signed)
Mild to moderate.  Discussed posture and ergonomics, discussed which activities to do which wants to avoid.  Continue conservative therapy at this time.

## 2019-11-28 NOTE — Assessment & Plan Note (Signed)
Patient has had arthritic changes.  Now responding well to osteopathic manipulation, patient given gabapentin to help with some of the nerve pain.  Patient wants to avoid any more injections and wants to avoid surgery at all cost.  Patient will increase activity slowly.  Follow-up again in 4 to 8 weeks

## 2019-11-28 NOTE — Assessment & Plan Note (Signed)
Decision today to treat with OMT was based on Physical Exam  After verbal consent patient was treated with HVLA, ME, FPR techniques in cervical, thoracic, lumbar and sacral areas  Patient tolerated the procedure well with improvement in symptoms  Patient given exercises, stretches and lifestyle modifications  See medications in patient instructions if given  Patient will follow up in 4-6 weeks 

## 2019-11-28 NOTE — Patient Instructions (Signed)
Gabapentin 200mg  at night Keep doing exercises See me again in 5-6 weeks   [address], [address] [place], [place] [address] Phone [phone]

## 2019-11-28 NOTE — Progress Notes (Signed)
[name] [address] [place], [place] [address] Phone: [phone] Subjective:   Allison Allison [name], am serving as a scribe for Dr. Hulan [name]. This visit occurred during the SARS-CoV-2 public health emergency.  Safety protocols were in place, including screening questions prior to the visit, additional usage of staff PPE, and extensive cleaning of exam room while observing appropriate contact time as indicated for disinfecting solutions.    CC: back pain hip pain follow-up  [id number]   10/16/2019 Multifactorial.  Discussed with patient about icing regimen, home exercise, core stability.  Responded well to osteopathic manipulation today.  Will avoid very high impact secondary to patient's underlying osteoporosis.  Patient will follow up with me again 4 to 8 weeks.  Update 11/28/2019 Allison Allison [name] is a 68 y.o. female coming in with complaint of back pain. Patient did have a flare in November. Was put on Voltaren tabs and this helped to decrease her pain. Is training for a 5k walk and has been doing her HEP. Has good and bad days. Pain occurring in right hip and lower back.  Patient since then has had mild discomfort and pain.  Discussed which activities to do which she has been doing the exercises fairly regularly and would state about 20 to 30% better.   Past Medical History:  Diagnosis Date  . Chicken pox   . Depression   . Diverticulitis   . Personal history of chemotherapy   . Personal history of radiation therapy   . UTI (urinary tract infection)    Past Surgical History:  Procedure Laterality Date  . ABDOMINAL HYSTERECTOMY    . BREAST BIOPSY Left 1999  . BREAST LUMPECTOMY Left 2000  . TONSILECTOMY/ADENOIDECTOMY WITH MYRINGOTOMY     Social History   Socioeconomic History  . Marital status: Married    Spouse name: Not on file  . Number of children: Not on file  . Years of education: Not on file  . Highest education level: Not on file   Occupational History  . Not on file  Social Needs  . Financial resource strain: Not on file  . Food insecurity    Worry: Not on file    Inability: Not on file  . Transportation needs    Medical: Not on file    Non-medical: Not on file  Tobacco Use  . Smoking status: Former Smoker    Quit date: 12/27/1972    Years since quitting: 46.9  . Smokeless tobacco: Never Used  Substance and Sexual Activity  . Alcohol use: Yes  . Drug use: Not on file  . Sexual activity: Yes  Lifestyle  . Physical activity    Days per week: 4 days    Minutes per session: Not on file  . Stress: Not on file  Relationships  . Social Herbalist on phone: Not on file    Gets together: Not on file    Attends religious service: Not on file    Active member of club or organization: Not on file    Attends meetings of clubs or organizations: Not on file    Relationship status: Not on file  Other Topics Concern  . Not on file  Social History Narrative  . Not on file   Allergies  Allergen Reactions  . Penicillins     Childhood allergy  . Sulfa Antibiotics Hives and Swelling   Family History  Problem Relation Age of Onset  . Alcohol abuse Mother   .  Cancer Mother   . Alcohol abuse Father   . Depression Father   . Early death Father   . Alcohol abuse Brother   . Cancer Brother   . Breast cancer Neg Hx     Current Outpatient Medications (Endocrine & Metabolic):  .  alendronate (FOSAMAX) 70 MG tablet, Take one tab by mouth once weekly on empty stomach with a full glass of water    Current Outpatient Medications (Analgesics):  .  diclofenac (VOLTAREN) 75 MG EC tablet, Take 1 tablet (75 mg total) by mouth 2 (two) times daily.   Current Outpatient Medications (Other):  Marland Kitchen  Calcium Citrate-Vitamin D (CALCIUM + D PO), Take by mouth. .  diclofenac sodium (VOLTAREN) 1 % GEL, Apply 2 grams topically to affected area qid .  gabapentin (NEURONTIN) 100 MG capsule, Take 2 capsules (200 mg total)  by mouth at bedtime. Marland Kitchen  MAGNESIUM PO, Take by mouth. Marland Kitchen  NIACIN PO, Take by mouth. Marland Kitchen  OVER THE COUNTER MEDICATION, Chinese herbal supplement .  sertraline (ZOLOFT) 25 MG tablet, Take 1 tablet (25 mg total) by mouth daily. .  Vitamin D, Ergocalciferol, (DRISDOL) 1.25 MG (50000 UT) CAPS capsule, Take 1 capsule (50,000 Units total) by mouth every 7 (seven) days.    Past medical history, social, surgical and family history all reviewed in electronic medical record.  Allison [name]   Review of Systems:  Allison [name], visual changes, nausea, vomiting, diarrhea, constipation, dizziness, abdominal pain, skin rash, fevers, chills, night sweats, weight loss, swollen lymph nodes, body aches, joint swelling, , chest pain, shortness of breath, mood changes.  Positive muscle aches Objective  Blood pressure 120/82, pulse (!) 54, height 5\' 4"  (1.626 m), weight 119 lb (54 kg), SpO2 98 %.    General: Allison [name], dressed appropriately.  HEENT: Pupils equal, extraocular movements intact  Respiratory: Patient's speak in full sentences and does not appear short of breath  Cardiovascular: Allison [name], non tender, Allison [name]  Skin: Warm dry intact with Allison [name] signs of infection or rash on extremities or on axial skeleton.  Abdomen: Soft nontender  Neuro: Cranial nerves II through XII are intact, neurovascularly intact in all extremities with 2+ DTRs and 2+ pulses.  Lymph: Allison [name] lymphadenopathy of posterior or anterior cervical chain or axillae bilaterally.  Gait normal with good balance and coordination.  MSK:  tender with full range of motion and good stability and symmetric strength and tone of shoulders, elbows, wrist, hip, knee and ankles bilaterally.  Hypermobility noted with mild to moderate arthritic changes of multiple joints Low back exam does have some loss of lordosis with some very  mild degenerative scoliosis noted.  Patient does have mild tightness with Corky Sox test on the right.  Negative straight leg test.  Tenderness to palpation in paraspinal musculature lumbar spine right greater than left 5 out of 5 strength in lower extremities.  Some in provement in range of motion and less pain in the thoracolumbar juncture  Osteopathic findings  T7 extended rotated and side bent left L2 flexed rotated and side bent right Sacrum right on right    Impression and Recommendations:     This case required medical decision making of moderate complexity. The above documentation has been reviewed and is accurate and complete Lyndal Pulley, DO       Note: This dictation was prepared with Dragon dictation along with smaller  Company secretary. Any transcriptional errors that result from this process are unintentional.

## 2019-12-02 ENCOUNTER — Encounter: Payer: Self-pay | Admitting: Family Medicine

## 2019-12-03 ENCOUNTER — Other Ambulatory Visit: Payer: Self-pay | Admitting: Family Medicine

## 2019-12-03 DIAGNOSIS — M810 Age-related osteoporosis without current pathological fracture: Secondary | ICD-10-CM

## 2019-12-19 ENCOUNTER — Encounter: Payer: Self-pay | Admitting: Physician Assistant

## 2019-12-19 ENCOUNTER — Ambulatory Visit: Payer: Medicare Other | Attending: Internal Medicine

## 2019-12-19 ENCOUNTER — Ambulatory Visit (INDEPENDENT_AMBULATORY_CARE_PROVIDER_SITE_OTHER): Payer: Medicare Other | Admitting: Physician Assistant

## 2019-12-19 VITALS — HR 72 | Temp 97.6°F | Ht 64.0 in | Wt 120.0 lb

## 2019-12-19 DIAGNOSIS — Z20822 Contact with and (suspected) exposure to covid-19: Secondary | ICD-10-CM

## 2019-12-19 DIAGNOSIS — Z20828 Contact with and (suspected) exposure to other viral communicable diseases: Secondary | ICD-10-CM | POA: Diagnosis not present

## 2019-12-19 DIAGNOSIS — R0981 Nasal congestion: Secondary | ICD-10-CM | POA: Diagnosis not present

## 2019-12-19 MED ORDER — DOXYCYCLINE HYCLATE 100 MG PO TABS: 100.0000 mg | ORAL_TABLET | Freq: Two times a day (BID) | ORAL | 0 refills | Status: DC

## 2019-12-19 NOTE — Progress Notes (Signed)
Virtual Visit via Video   I connected with Allison [name] on 12/19/19 at  9:20 AM EST by a video enabled telemedicine application and verified that I am speaking with the correct person using two identifiers. Location patient: Home Location provider: [facility], Office Persons participating in the virtual visit: Darleene, Mazanec PA-C  I discussed the limitations of evaluation and management by telemedicine and the availability of in person appointments. The patient expressed understanding and agreed to proceed.  Subjective:   HPI:   Ear Fullness  There is pain in both ears. This is a chronic problem. The current episode started 2 weeks ago. The problem occurs constantly. The problem has been unchanged. There has been no fever. The pain is at a severity of 0/10. The patient is experiencing no pain. Associated symptoms include headaches. She has tried nothing for the symptoms. The treatment provided mild relief. Her past medical history is significant for hearing loss. There is no history of a chronic ear infection or a tympanostomy tube. patient has hearing aid    Had some looser than normal stools yesterday but none today. Denies any known covid-19 exposures. Yesterday she "just felt bad."  Denies: cough, SOB, chest pain, chest tightness, dizziness  Has tried ibuprofen and OTC ear cleaning product.  ROS: See pertinent positives and negatives per HPI.  Patient Active Problem List   Diagnosis Date Noted  . Primary osteoarthritis of right hip 11/02/2019  . Metatarsalgia of left foot 11/02/2019  . Nonallopathic lesion of lumbosacral region 10/16/2019  . Nonallopathic lesion of sacral region 10/16/2019  . Nonallopathic lesion of thoracic region 10/16/2019  . Osteoporosis without current pathological fracture 03/23/2019  . Dysthymia 03/23/2019  . Lumbar herniated disc 03/23/2019  . Hx of breast cancer, left     Social History   Tobacco Use  . Smoking status:  Former Smoker    Quit date: 12/27/1972    Years since quitting: 47.0  . Smokeless tobacco: Never Used  Substance Use Topics  . Alcohol use: Yes    Current Outpatient Medications:  .  alendronate (FOSAMAX) 70 MG tablet, TAKE 1 TABLET BY MOUTH ONCE WEEKLY ON EMPTY STOMACH WITH FULL GLASS OF WATER, Disp: 12 tablet, Rfl: 1 .  Calcium Citrate-Vitamin D (CALCIUM + D PO), Take by mouth., Disp: , Rfl:  .  diclofenac (VOLTAREN) 75 MG EC tablet, Take 1 tablet (75 mg total) by mouth 2 (two) times daily., Disp: 30 tablet, Rfl: 0 .  diclofenac sodium (VOLTAREN) 1 % GEL, Apply 2 grams topically to affected area qid, Disp: 100 g, Rfl: 1 .  gabapentin (NEURONTIN) 100 MG capsule, Take 2 capsules (200 mg total) by mouth at bedtime., Disp: 180 capsule, Rfl: 0 .  MAGNESIUM PO, Take by mouth., Disp: , Rfl:  .  NIACIN PO, Take by mouth., Disp: , Rfl:  .  OVER THE COUNTER MEDICATION, Chinese herbal supplement, Disp: , Rfl:  .  sertraline (ZOLOFT) 25 MG tablet, Take 1 tablet (25 mg total) by mouth daily., Disp: 90 tablet, Rfl: 3 .  doxycycline (VIBRA-TABS) 100 MG tablet, Take 1 tablet (100 mg total) by mouth 2 (two) times daily., Disp: 20 tablet, Rfl: 0  Allergies  Allergen Reactions  . Penicillins     Childhood allergy  . Sulfa Antibiotics Hives and Swelling    Objective:   VITALS: Per patient if applicable, see vitals. GENERAL: Alert, appears well and in no acute distress. HEENT: Atraumatic, conjunctiva clear, no obvious abnormalities on inspection  of external nose and ears. NECK: Normal movements of the head and neck. CARDIOPULMONARY: No increased WOB. Speaking in clear sentences. I:E ratio WNL.  MS: Moves all visible extremities without noticeable abnormality. PSYCH: Pleasant and cooperative, well-groomed. Speech normal rate and rhythm. Affect is appropriate. Insight and judgement are appropriate. Attention is focused, linear, and appropriate.  NEURO: CN grossly intact. Oriented as arrived to  appointment on time with no prompting. Moves both UE equally.  SKIN: No obvious lesions, wounds, erythema, or cyanosis noted on face or hands.  Assessment and Plan:   [name] was seen today for ear fullness.  Diagnoses and all orders for this visit:  Sinus congestion  Other orders -     doxycycline (VIBRA-TABS) 100 MG tablet; Take 1 tablet (100 mg total) by mouth 2 (two) times daily.   No red flags on discussion. Given chronicity of symptoms, will treat for sinusitis with doxycycline per orders. Out of an abundance of caution, we are testing her for COVID, instructions given on where to get this done.  Worsening precautions advised in the interim.  . Reviewed expectations re: course of current medical issues. . Discussed self-management of symptoms. . Outlined signs and symptoms indicating need for more acute intervention. . Patient verbalized understanding and all questions were answered. Marland Kitchen Health Maintenance issues including appropriate healthy diet, exercise, and smoking avoidance were discussed with patient. . See orders for this visit as documented in the electronic medical record.  I discussed the assessment and treatment plan with the patient. The patient was provided an opportunity to ask questions and all were answered. The patient agreed with the plan and demonstrated an understanding of the instructions.   The patient was advised to call back or seek an in-person evaluation if the symptoms worsen or if the condition fails to improve as anticipated.   [place], [place] 12/19/2019

## 2019-12-20 LAB — NOVEL CORONAVIRUS, NAA: SARS-CoV-2, NAA: NOT DETECTED

## 2020-01-01 ENCOUNTER — Ambulatory Visit (INDEPENDENT_AMBULATORY_CARE_PROVIDER_SITE_OTHER): Payer: Medicare Other | Admitting: Family Medicine

## 2020-01-01 ENCOUNTER — Encounter: Payer: Self-pay | Admitting: Family Medicine

## 2020-01-01 ENCOUNTER — Ambulatory Visit
Admission: RE | Admit: 2020-01-01 | Discharge: 2020-01-01 | Disposition: A | Discharge status: Home / Self Care | Payer: Medicare Other | Source: Ambulatory Visit | Attending: Family Medicine | Admitting: Family Medicine

## 2020-01-01 ENCOUNTER — Other Ambulatory Visit: Payer: Self-pay

## 2020-01-01 VITALS — BP 116/82 | HR 68 | Ht 64.0 in | Wt 120.0 lb

## 2020-01-01 DIAGNOSIS — M999 Biomechanical lesion, unspecified: Secondary | ICD-10-CM | POA: Diagnosis not present

## 2020-01-01 DIAGNOSIS — M1611 Unilateral primary osteoarthritis, right hip: Secondary | ICD-10-CM

## 2020-01-01 DIAGNOSIS — Z1231 Encounter for screening mammogram for malignant neoplasm of breast: Secondary | ICD-10-CM

## 2020-01-01 MED ORDER — MELOXICAM 15 MG PO TABS: 15.0000 mg | ORAL_TABLET | Freq: Every day | ORAL | 0 refills | Status: DC

## 2020-01-01 NOTE — Patient Instructions (Signed)
Meloxicam daily for 10 days then in burst of 5 day intervals Stop Diclofenac Continue vitamins See me in 6 weeks for another check up

## 2020-01-01 NOTE — Progress Notes (Signed)
[place] Phone: [phone] Subjective:   Allison [name], am serving as a scribe for Dr. Hulan Saas. This visit occurred during the SARS-CoV-2 public health emergency.  Safety protocols were in place, including screening questions prior to the visit, additional usage of staff PPE, and extensive cleaning of exam room while observing appropriate contact time as indicated for disinfecting solutions.   \  CC: Low back and hip pain follow-up  [id number]   11/28/2019 Mild to moderate.  Discussed posture and ergonomics, discussed which activities to do which wants to avoid.  Continue conservative therapy at this time.  Update 01/01/2020 Allison [name] is a 69 y.o. female coming in with complaint of back pain. Was feeling good up until Sunday, 12/30/2019. Patient took 200mg  gabapentin at bedtime as she had been using medication prn. Did try stretching and back feels better after that this morning.  Patient states that it is aching sensation.  Was very uncomfortable at night but the gabapentin was very helpful.  Taking one anti-inflammatory that was also helpful.     Past Medical History:  Diagnosis Date  . Chicken pox   . Depression   . Diverticulitis   . Personal history of chemotherapy   . Personal history of radiation therapy   . UTI (urinary tract infection)    Past Surgical History:  Procedure Laterality Date  . ABDOMINAL HYSTERECTOMY    . BREAST BIOPSY Left 1999  . BREAST LUMPECTOMY Left 2000  . TONSILECTOMY/ADENOIDECTOMY WITH MYRINGOTOMY     Social History   Socioeconomic History  . Marital status: Married    Spouse name: Not on file  . Number of children: Not on file  . Years of education: Not on file  . Highest education level: Not on file  Occupational History  . Not on file  Tobacco Use  . Smoking status: Former Smoker    Quit date: 12/27/1972    Years since quitting: 47.0  . Smokeless  tobacco: Never Used  Substance and Sexual Activity  . Alcohol use: Yes  . Drug use: Not on file  . Sexual activity: Yes  Other Topics Concern  . Not on file  Social History Narrative  . Not on file   Social Determinants of Health   Financial Resource Strain:   . Difficulty of Paying Living Expenses: Not on file  Food Insecurity:   . Worried About Charity fundraiser in the Last Year: Not on file  . Ran Out of Food in the Last Year: Not on file  Transportation Needs:   . Lack of Transportation (Medical): Not on file  . Lack of Transportation (Non-Medical): Not on file  Physical Activity: Unknown  . Days of Exercise per Week: 4 days  . Minutes of Exercise per Session: Not on file  Stress:   . Feeling of Stress : Not on file  Social Connections:   . Frequency of Communication with Friends and Family: Not on file  . Frequency of Social Gatherings with Friends and Family: Not on file  . Attends Religious Services: Not on file  . Active Member of Clubs or Organizations: Not on file  . Attends Archivist Meetings: Not on file  . Marital Status: Not on file   Allergies  Allergen Reactions  . Penicillins     Childhood allergy  . Sulfa Antibiotics Hives and Swelling   Family History  Problem Relation Age of Onset  .  Alcohol abuse Mother   . Cancer Mother   . Alcohol abuse Father   . Depression Father   . Early death Father   . Alcohol abuse Brother   . Cancer Brother   . Breast cancer Neg Hx     Current Outpatient Medications (Endocrine & Metabolic):  .  alendronate (FOSAMAX) 70 MG tablet, TAKE 1 TABLET BY MOUTH ONCE WEEKLY ON EMPTY STOMACH WITH FULL GLASS OF WATER    Current Outpatient Medications (Analgesics):  .  diclofenac (VOLTAREN) 75 MG EC tablet, Take 1 tablet (75 mg total) by mouth 2 (two) times daily. .  meloxicam (MOBIC) 15 MG tablet, Take 1 tablet (15 mg total) by mouth daily.   Current Outpatient Medications (Other):  Marland Kitchen  Calcium  Citrate-Vitamin D (CALCIUM + D PO), Take by mouth. .  diclofenac sodium (VOLTAREN) 1 % GEL, Apply 2 grams topically to affected area qid .  doxycycline (VIBRA-TABS) 100 MG tablet, Take 1 tablet (100 mg total) by mouth 2 (two) times daily. Marland Kitchen  gabapentin (NEURONTIN) 100 MG capsule, Take 2 capsules (200 mg total) by mouth at bedtime. Marland Kitchen  MAGNESIUM PO, Take by mouth. Marland Kitchen  NIACIN PO, Take by mouth. Marland Kitchen  OVER THE COUNTER MEDICATION, Chinese herbal supplement .  sertraline (ZOLOFT) 25 MG tablet, Take 1 tablet (25 mg total) by mouth daily.    Past medical history, social, surgical and family history all reviewed in electronic medical record.  [name]   Review of Systems:  [name], visual changes, nausea, vomiting, diarrhea, constipation, dizziness, abdominal pain, skin rash, fevers, chills, night sweats, weight loss, swollen lymph nodes, body aches, joint swelling,  chest pain, shortness of breath, mood changes.  Positive muscle aches  Objective  Blood pressure 116/82, pulse 68, height 5\' 4"  (1.626 m), weight 120 lb (54.4 kg), SpO2 98 %.    General: [name], dressed appropriately.  HEENT: Pupils equal, extraocular movements intact  Respiratory: Patient's speak in full sentences and does not appear short of breath  Cardiovascular: [name], non tender, [name]  Skin: Warm dry intact with [name] signs of infection or rash on extremities or on axial skeleton.  Abdomen: Soft nontender  Neuro: Cranial nerves II through XII are intact, neurovascularly intact in all extremities with 2+ DTRs and 2+ pulses.  Lymph: [name] lymphadenopathy of posterior or anterior cervical chain or axillae bilaterally.  Gait mild antalgic MSK:  tender with limited range of motion and good stability and symmetric strength and tone of shoulders, elbows, wrist, , knee and ankles bilaterally.    back exam does have some loss of lordosis.  Patient does have some very mild degenerative scoliosis.  Patient has difficulty with right-sided FABER test secondary to patient's underlying arthritic changes of the hip.  Negative straight leg test.  Tender to palpation in the paraspinal musculature lumbar spine right greater than left.  Osteopathic findings T6 extended rotated and side bent left L2 flexed rotated and side bent right Sacrum right on right    Impression and Recommendations:     This case required medical decision making of moderate complexity. The above documentation has been reviewed and is accurate and complete Lyndal Pulley, DO       Note: This dictation was prepared with Dragon dictation along with smaller phrase technology. Any transcriptional errors that result from this process are unintentional.

## 2020-01-01 NOTE — Assessment & Plan Note (Signed)
Noted on the right is.  We discussed with patient in great length.  Patient of different her mental procedure and we discussed the pros and cons of this.  We discussed icing regimen and home exercises, we discussed which activities to do which wants to avoid.  Patient wants to continue with conservative therapy.  We will discontinue her diclofenac and start meloxicam to see if this would get better coverage.  Continue same medications otherwise.  Encourage patient to continue to do the home exercises and follow-up again in 6 weeks.

## 2020-02-12 ENCOUNTER — Other Ambulatory Visit: Payer: Self-pay

## 2020-02-12 ENCOUNTER — Ambulatory Visit (INDEPENDENT_AMBULATORY_CARE_PROVIDER_SITE_OTHER): Payer: Medicare Other | Admitting: Family Medicine

## 2020-02-12 ENCOUNTER — Encounter: Payer: Self-pay | Admitting: Family Medicine

## 2020-02-12 VITALS — BP 112/82 | HR 59 | Ht 64.0 in | Wt 119.0 lb

## 2020-02-12 DIAGNOSIS — M999 Biomechanical lesion, unspecified: Secondary | ICD-10-CM

## 2020-02-12 DIAGNOSIS — M7742 Metatarsalgia, left foot: Secondary | ICD-10-CM

## 2020-02-12 DIAGNOSIS — M5126 Other intervertebral disc displacement, lumbar region: Secondary | ICD-10-CM | POA: Diagnosis not present

## 2020-02-12 NOTE — Assessment & Plan Note (Addendum)
Chronic problem.  Minimal exacerbation.  Discussed posture and ergonomics, will discuss strengthening mechanics.  Which activities to do which wants to avoid.  Patient will increase activity slowly.  Follow-up again in 4 to 8 weeks social determinants of health including transportation and social with patient not having a very large support system.

## 2020-02-12 NOTE — Patient Instructions (Addendum)
[employer] Dont lace last eye of shoe HOKA and OOFOS recovery sandals See me in 6-8 weeks

## 2020-02-12 NOTE — Assessment & Plan Note (Signed)
Decision today to treat with OMT was based on Physical Exam  After verbal consent patient was treated with HVLA, ME, FPR techniques in thoracic, , lumbar and sacral areas  Patient tolerated the procedure well with improvement in symptoms  Patient given exercises, stretches and lifestyle modifications  See medications in patient instructions if given  Patient will follow up in 4-8 weeks

## 2020-02-12 NOTE — Progress Notes (Signed)
[place] Phone: [phone] Subjective:   [name], am serving as a scribe for Dr. Hulan Saas. This visit occurred during the SARS-CoV-2 public health emergency.  Safety protocols were in place, including screening questions prior to the visit, additional usage of staff PPE, and extensive cleaning of exam room while observing appropriate contact time as indicated for disinfecting solutions.   I'm seeing this patient by the request  of:  [name], MD  CC: Left knee pain, back pain follow-up  [id number]  Allison [name] is a 69 y.o. female coming in with complaint of  back pain. Last seen on 01/01/2020 for OMT. Patient states that she did have a flare after doing some work around the house 3 weeks ago. Has been using gabapentin and Mobic in 5 day bursts. Has not had problems since then.   Also complaining of left foot pain over the metatarsal heads. Unable to walk around house without shoes on due to pain. Did try a metatarsal pad in the shoe. Notices a separation of the 2nd toe from the 3rd toe.     Past Medical History:  Diagnosis Date  . Chicken pox   . Depression   . Diverticulitis   . Personal history of chemotherapy   . Personal history of radiation therapy   . UTI (urinary tract infection)    Past Surgical History:  Procedure Laterality Date  . ABDOMINAL HYSTERECTOMY    . BREAST BIOPSY Left 1999  . BREAST LUMPECTOMY Left 2000  . TONSILECTOMY/ADENOIDECTOMY WITH MYRINGOTOMY     Social History   Socioeconomic History  . Marital status: Married    Spouse name: Not on file  . Number of children: Not on file  . Years of education: Not on file  . Highest education level: Not on file  Occupational History  . Not on file  Tobacco Use  . Smoking status: Former Smoker    Quit date: 12/27/1972    Years since quitting: 47.1  . Smokeless tobacco: Never Used  Substance and Sexual Activity    . Alcohol use: Yes  . Drug use: Not on file  . Sexual activity: Yes  Other Topics Concern  . Not on file  Social History Narrative  . Not on file   Social Determinants of Health   Financial Resource Strain:   . Difficulty of Paying Living Expenses: Not on file  Food Insecurity:   . Worried About Charity fundraiser in the Last Year: Not on file  . Ran Out of Food in the Last Year: Not on file  Transportation Needs:   . Lack of Transportation (Medical): Not on file  . Lack of Transportation (Non-Medical): Not on file  Physical Activity: Unknown  . Days of Exercise per Week: 4 days  . Minutes of Exercise per Session: Not on file  Stress:   . Feeling of Stress : Not on file  Social Connections:   . Frequency of Communication with Friends and Family: Not on file  . Frequency of Social Gatherings with Friends and Family: Not on file  . Attends Religious Services: Not on file  . Active Member of Clubs or Organizations: Not on file  . Attends Archivist Meetings: Not on file  . Marital Status: Not on file   Allergies  Allergen Reactions  . Penicillins     Childhood allergy  . Sulfa Antibiotics Hives and Swelling   Family  History  Problem Relation Age of Onset  . Alcohol abuse Mother   . Cancer Mother   . Alcohol abuse Father   . Depression Father   . Early death Father   . Alcohol abuse Brother   . Cancer Brother   . Breast cancer Neg Hx     Current Outpatient Medications (Endocrine & Metabolic):  .  alendronate (FOSAMAX) 70 MG tablet, TAKE 1 TABLET BY MOUTH ONCE WEEKLY ON EMPTY STOMACH WITH FULL GLASS OF WATER    Current Outpatient Medications (Analgesics):  .  diclofenac (VOLTAREN) 75 MG EC tablet, Take 1 tablet (75 mg total) by mouth 2 (two) times daily. .  meloxicam (MOBIC) 15 MG tablet, Take 1 tablet (15 mg total) by mouth daily.   Current Outpatient Medications (Other):  Marland Kitchen  Calcium Citrate-Vitamin D (CALCIUM + D PO), Take by mouth. .  diclofenac  sodium (VOLTAREN) 1 % GEL, Apply 2 grams topically to affected area qid .  doxycycline (VIBRA-TABS) 100 MG tablet, Take 1 tablet (100 mg total) by mouth 2 (two) times daily. Marland Kitchen  gabapentin (NEURONTIN) 100 MG capsule, Take 2 capsules (200 mg total) by mouth at bedtime. Marland Kitchen  MAGNESIUM PO, Take by mouth. Marland Kitchen  NIACIN PO, Take by mouth. Marland Kitchen  OVER THE COUNTER MEDICATION, Chinese herbal supplement .  sertraline (ZOLOFT) 25 MG tablet, Take 1 tablet (25 mg total) by mouth daily.   Reviewed prior external information including notes and imaging from  primary care provider As well as notes that were available from care everywhere and other healthcare systems.  Past medical history, social, surgical and family history all reviewed in electronic medical record.  No pertanent information unless stated regarding to the chief complaint.   Review of Systems:  No headache, visual changes, nausea, vomiting, diarrhea, constipation, dizziness, abdominal pain, skin rash, fevers, chills, night sweats, weight loss, swollen lymph nodes, body aches, joint swelling, chest pain, shortness of breath, mood changes. POSITIVE muscle aches  Objective  Blood pressure 112/82, pulse (!) 59, height 5\' 4"  (1.626 m), weight 119 lb (54 kg), SpO2 99 %.   General: No apparent distress alert and oriented x3 mood and affect normal, dressed appropriately.  HEENT: Pupils equal, extraocular movements intact  Respiratory: Patient's speak in full sentences and does not appear short of breath  Cardiovascular: No lower extremity edema, non tender, no erythema  Skin: Warm dry intact with no signs of infection or rash on extremities or on axial skeleton.  Abdomen: Soft nontender  Neuro: Cranial nerves II through XII are intact, neurovascularly intact in all extremities with 2+ DTRs and 2+ pulses.  Lymph: No lymphadenopathy of posterior or anterior cervical chain or axillae bilaterally.  Gait normal with good balance and coordination.  MSK:  Non  tender with full range of motion and good stability and symmetric strength and tone of shoulders, elbows, wrist, hip, knee and ankles bilaterally.  Very mild arthritic changes of multiple joints Foot exam shows a breakdown of the transverse arch right greater than left.  Low back exam does have some loss of lordosis, tender to palpation over the sacroiliac joints right greater than left.  Positive Faber on the right side.  Negative straight leg test but still tightness with the hamstrings bilaterally.  Osteopathic findings  T9 extended rotated and side bent left L2 flexed rotated and side bent right Sacrum right on right      Impression and Recommendations:     This case required medical decision making of moderate  complexity. The above documentation has been reviewed and is accurate and complete Lyndal Pulley, DO       Note: This dictation was prepared with Dragon dictation along with smaller phrase technology. Any transcriptional errors that result from this process are unintentional.

## 2020-02-12 NOTE — Assessment & Plan Note (Signed)
Continues to have some mild trouble.  We discussed the breakdown of the transverse arch, home exercises given, discussed over-the-counter orthotics, discussed what other shoes can be beneficial and avoiding being barefoot.  Patient will increase activity slowly.  Follow-up with me again in 4 to 8 weeks.

## 2020-02-18 DIAGNOSIS — Z08 Encounter for follow-up examination after completed treatment for malignant neoplasm: Secondary | ICD-10-CM | POA: Diagnosis not present

## 2020-02-18 DIAGNOSIS — Z853 Personal history of malignant neoplasm of breast: Secondary | ICD-10-CM | POA: Diagnosis not present

## 2020-04-01 ENCOUNTER — Encounter: Payer: Self-pay | Admitting: Family Medicine

## 2020-04-01 ENCOUNTER — Other Ambulatory Visit: Payer: Self-pay | Admitting: Family Medicine

## 2020-04-01 ENCOUNTER — Other Ambulatory Visit: Payer: Self-pay

## 2020-04-01 ENCOUNTER — Ambulatory Visit (INDEPENDENT_AMBULATORY_CARE_PROVIDER_SITE_OTHER): Payer: Medicare Other | Admitting: Family Medicine

## 2020-04-01 VITALS — BP 110/68 | HR 71 | Temp 97.7°F | Resp 14 | Ht 64.0 in | Wt 119.2 lb

## 2020-04-01 DIAGNOSIS — Z853 Personal history of malignant neoplasm of breast: Secondary | ICD-10-CM

## 2020-04-01 DIAGNOSIS — M810 Age-related osteoporosis without current pathological fracture: Secondary | ICD-10-CM

## 2020-04-01 DIAGNOSIS — M8589 Other specified disorders of bone density and structure, multiple sites: Secondary | ICD-10-CM

## 2020-04-01 DIAGNOSIS — N644 Mastodynia: Secondary | ICD-10-CM

## 2020-04-01 DIAGNOSIS — E2839 Other primary ovarian failure: Secondary | ICD-10-CM

## 2020-04-01 HISTORY — DX: Age-related osteoporosis without current pathological fracture: M81.0

## 2020-04-01 NOTE — Progress Notes (Signed)
Subjective  CC:  Chief Complaint  Patient presents with  . Breast Pain    left breast, pain started over a week ago, no pain unless she is touching breast, breast cancer 2000, states she does not feel any lumps, oncologist is in [place]    HPI: Allison [name] is a 69 y.o. female who presents to the office today to address the problems listed above in the chief complaint.  Pt w/ h/o left breast ca dxd in 2000 and treated with lumpectomy, rads, chemo and tamoxifen. Had nl mammo at [facility] in January. C/o one week of soreness/tenderness in one localized spot near old lumpectomy site. No redness, or injury or bruising or rash. At times, has felt a lump but not always. No pain if not touching area. No pain with use of arms or pushing/pulling lifting. No nipple discharge. Had oncology visit earlier this year as well with nl exam.   Osteoporosis on fosamax since 2019: I reviewed her DEXA results. Had T < -2.5 at L1 and L2 but all other sites and avg's were > -2.5 and dxd with osteopenia. Tolerating meds. Due for repeat. All prior exams she reports never showed osteoporosis. Exercises and takes supplements. Was on tamoxifen for years.   Assessment  1. Breast pain, left   2. Osteopenia of multiple sites   3. Hypoestrogenism   4. Hx of breast cancer, left   5. Osteoporosis without current pathological fracture, unspecified osteoporosis type      Plan   Breat pain with thickening and h/o breast cancer:  Send for ultrasound; further eval pending results. No clear mass palpated on exam today.   Osteopenia - on fosamax due to two readings on lumbar spine. Will get f/u dexa and consider stopping meds if remains osteopenic. Discussed indications for meds and recommended follow up.   Follow up: as scheduled  09/04/2020  Orders Placed This Encounter  Procedures  . DG Bone Density  . [place] Unlisted Procedure Breast   No orders of the defined types were placed in this encounter.     I reviewed the  patients updated PMH, FH, and SocHx.    Patient Active Problem List   Diagnosis Date Noted  . Dysthymia 03/23/2019    Priority: High  . Hx of breast cancer, left     Priority: High  . Osteopenia of multiple sites 04/01/2020    Priority: Medium  . Primary osteoarthritis of right hip 11/02/2019    Priority: Medium  . 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 Metatarsalgia of left foot 11/02/2019    Priority: Low  . Nonallopathic lesion of lumbosacral region 10/16/2019  . Nonallopathic lesion of sacral region 10/16/2019  . Nonallopathic lesion of thoracic region 10/16/2019   Current Meds  Medication Sig  . alendronate (FOSAMAX) 70 MG tablet TAKE 1 TABLET BY MOUTH ONCE WEEKLY ON EMPTY STOMACH WITH FULL GLASS OF WATER  . Calcium Citrate-Vitamin D (CALCIUM + D PO) Take by mouth.  . diclofenac (VOLTAREN) 75 MG EC tablet Take 1 tablet (75 mg total) by mouth 2 (two) times daily.  . diclofenac sodium (VOLTAREN) 1 % GEL Apply 2 grams topically to affected area qid  . doxycycline (VIBRA-TABS) 100 MG tablet Take 1 tablet (100 mg total) by mouth 2 (two) times daily.  Marland Kitchen gabapentin (NEURONTIN) 100 MG capsule Take 2 capsules (200 mg total) by mouth at bedtime.  Marland Kitchen MAGNESIUM PO Take by mouth.  . meloxicam (MOBIC) 15 MG tablet  Take 1 tablet (15 mg total) by mouth daily.  Marland Kitchen NIACIN PO Take by mouth.  Marland Kitchen OVER THE COUNTER MEDICATION Chinese herbal supplement  . sertraline (ZOLOFT) 25 MG tablet Take 1 tablet (25 mg total) by mouth daily.    Allergies: Patient is allergic to penicillins and sulfa antibiotics. Family History: Patient family history includes Alcohol abuse in her brother, father, and mother; Cancer in her brother and mother; Depression in her father; Early death in her father. Social History:  Patient  reports that she quit smoking about 47 years ago. She has never used smokeless tobacco. She reports current alcohol use.  Review of Systems: Constitutional: Negative for  fever malaise or anorexia Cardiovascular: negative for chest pain Respiratory: negative for SOB or persistent cough Gastrointestinal: negative for abdominal pain  Objective  Vitals: BP 110/68   Pulse 71   Temp 97.7 F (36.5 C) (Temporal)   Resp 14   Ht 5\' 4"  (1.626 m)   Wt 119 lb 3.2 oz (54.1 kg)   SpO2 99%   BMI 20.46 kg/m  General: no acute distress , A&Ox3 Breast: right breast w/o mass, ttp or adenopathy; left breast: scar present in left UOQ. No discrete masses palpated. Tender at 7:00 with thickening present. No adenopathy or nipple discharge.  Skin:  Warm, no rashes     Commons side effects, risks, benefits, and alternatives for medications and treatment plan prescribed today were discussed, and the patient expressed understanding of the given instructions. Patient is instructed to call or message via MyChart if he/she has any questions or concerns regarding our treatment plan. No barriers to understanding were identified. We discussed Red Flag symptoms and signs in detail. Patient expressed understanding regarding what to do in case of urgent or emergency type symptoms.   Medication list was reconciled, printed and provided to the patient in AVS. Patient instructions and summary information was reviewed with the patient as documented in the AVS. This note was prepared with assistance of Dragon voice recognition software. Occasional wrong-word or sound-a-like substitutions may have occurred due to the inherent limitations of voice recognition software  This visit occurred during the SARS-CoV-2 public health emergency.  Safety protocols were in place, including screening questions prior to the visit, additional usage of staff PPE, and extensive cleaning of exam room while observing appropriate contact time as indicated for disinfecting solutions.

## 2020-04-01 NOTE — Patient Instructions (Signed)
Please return in September for your annual complete physical; please come fasting.  We will call you to get you scheduled for your breast ultrasound and/or mammogram AND a bone density.  You have a copy of the bone density results from 2019 showing osteopenia.   I'll let you know next steps based on the results.   If you have any questions or concerns, please don't hesitate to send me a message via MyChart or call the office at [phone]. Thank you for visiting with [place] today! It's our pleasure caring for you.

## 2020-04-01 NOTE — Addendum Note (Signed)
Addended by: Billey Chang on: 04/01/2020 09:36 AM   Modules accepted: Orders

## 2020-04-07 ENCOUNTER — Encounter: Payer: Self-pay | Admitting: Family Medicine

## 2020-04-07 ENCOUNTER — Ambulatory Visit (INDEPENDENT_AMBULATORY_CARE_PROVIDER_SITE_OTHER): Payer: Medicare Other | Admitting: Family Medicine

## 2020-04-07 ENCOUNTER — Other Ambulatory Visit: Payer: Self-pay

## 2020-04-07 VITALS — BP 102/74 | HR 59 | Ht 64.0 in | Wt 119.0 lb

## 2020-04-07 DIAGNOSIS — M5126 Other intervertebral disc displacement, lumbar region: Secondary | ICD-10-CM | POA: Diagnosis not present

## 2020-04-07 DIAGNOSIS — M999 Biomechanical lesion, unspecified: Secondary | ICD-10-CM | POA: Diagnosis not present

## 2020-04-07 NOTE — Telephone Encounter (Signed)
This breast ultrasound didn't get scheduled yet. Can you please recheck? And notify pt. Thanks!

## 2020-04-07 NOTE — Patient Instructions (Signed)
Volatren gel Eat wtihin 30 minutes of working out [employer] are my poster child See me in 4-6 weeks

## 2020-04-07 NOTE — Telephone Encounter (Signed)
Please see note from stephanie and notify pt to call to schedule ultrasound. The order is and has been in. Let us know if she has any trouble Thanks.

## 2020-04-07 NOTE — Assessment & Plan Note (Signed)
   Decision today to treat with OMT was based on Physical Exam  After verbal consent patient was treated with HVLA, ME, FPR techniques in cervical, thoracic,  lumbar and sacral areas, all areas are chronic   Patient tolerated the procedure well with improvement in symptoms  Patient given exercises, stretches and lifestyle modifications  See medications in patient instructions if given  Patient will follow up in 4-8 weeks 

## 2020-04-07 NOTE — Progress Notes (Signed)
[place] [facility] Phone: [phone] Subjective:   Allison [name], am serving as a scribe for Dr. Hulan Saas. This visit occurred during the SARS-CoV-2 public health emergency.  Safety protocols were in place, including screening questions prior to the visit, additional usage of staff PPE, and extensive cleaning of exam room while observing appropriate contact time as indicated for disinfecting solutions.   I'm seeing this patient by the request  of:  [name], MD   CC: Low back pain follow-up   [id number]  Allison [name] is a 69 y.o. female coming in with complaint of back pain. Last seen on 02/12/2020 for OMT. Patient states that she had one week when her pain increased but is unsure as to why.  Patient has been doing relatively well but is trying to increase activity.  States that usually 1 week out of the 6 weeks between office visits she does have a flare.  Has recently had some mild tightness.  Continues to workout on a regular basis though, has not needed any significant medications at the moment.       Past Medical History:  Diagnosis Date  . Chicken pox   . Depression   . Diverticulitis   . Personal history of chemotherapy   . Personal history of radiation therapy   . UTI (urinary tract infection)    Past Surgical History:  Procedure Laterality Date  . ABDOMINAL HYSTERECTOMY    . BREAST BIOPSY Left 1999  . BREAST LUMPECTOMY Left 2000  . TONSILECTOMY/ADENOIDECTOMY WITH MYRINGOTOMY     Social History   Socioeconomic History  . Marital status: Married    Spouse name: Not on file  . Number of children: Not on file  . Years of education: Not on file  . Highest education level: Not on file  Occupational History  . Not on file  Tobacco Use  . Smoking status: Former Smoker    Quit date: 12/27/1972    Years since quitting: 47.3  . Smokeless tobacco: Never Used  Substance and Sexual Activity    . Alcohol use: Yes  . Drug use: Not on file  . Sexual activity: Yes  Other Topics Concern  . Not on file  Social History Narrative  . Not on file   Social Determinants of Health   Financial Resource Strain:   . Difficulty of Paying Living Expenses:   Food Insecurity:   . Worried About Charity fundraiser in the Last Year:   . Arboriculturist in the Last Year:   Transportation Needs:   . Film/video editor (Medical):   Marland Kitchen Lack of Transportation (Non-Medical):   Physical Activity:   . Days of Exercise per Week:   . Minutes of Exercise per Session:   Stress:   . Feeling of Stress :   Social Connections:   . Frequency of Communication with Friends and Family:   . Frequency of Social Gatherings with Friends and Family:   . Attends Religious Services:   . Active Member of Clubs or Organizations:   . Attends Archivist Meetings:   Marland Kitchen Marital Status:    Allergies  Allergen Reactions  . Penicillins     Childhood allergy  . Sulfa Antibiotics Hives and Swelling   Family History  Problem Relation Age of Onset  . Alcohol abuse Mother   . Cancer Mother   . Alcohol abuse Father   . Depression Father   .  Early death Father   . Alcohol abuse Brother   . Cancer Brother   . Breast cancer Neg Hx     Current Outpatient Medications (Endocrine & Metabolic):  .  alendronate (FOSAMAX) 70 MG tablet, TAKE 1 TABLET BY MOUTH ONCE WEEKLY ON EMPTY STOMACH WITH FULL GLASS OF WATER    Current Outpatient Medications (Analgesics):  .  diclofenac (VOLTAREN) 75 MG EC tablet, Take 1 tablet (75 mg total) by mouth 2 (two) times daily. .  meloxicam (MOBIC) 15 MG tablet, Take 1 tablet (15 mg total) by mouth daily.   Current Outpatient Medications (Other):  Marland Kitchen  Calcium Citrate-Vitamin D (CALCIUM + D PO), Take by mouth. .  diclofenac sodium (VOLTAREN) 1 % GEL, Apply 2 grams topically to affected area qid .  doxycycline (VIBRA-TABS) 100 MG tablet, Take 1 tablet (100 mg total) by mouth 2  (two) times daily. Marland Kitchen  gabapentin (NEURONTIN) 100 MG capsule, Take 2 capsules (200 mg total) by mouth at bedtime. Marland Kitchen  MAGNESIUM PO, Take by mouth. Marland Kitchen  NIACIN PO, Take by mouth. .  sertraline (ZOLOFT) 25 MG tablet, Take 1 tablet (25 mg total) by mouth daily. Marland Kitchen  OVER THE COUNTER MEDICATION, Chinese herbal supplement   Reviewed prior external information including notes and imaging from  primary care provider As well as notes that were available from care everywhere and other healthcare systems.  Past medical history, social, surgical and family history all reviewed in electronic medical record.  [name]   Review of Systems:  [name], visual changes, nausea, vomiting, diarrhea, constipation, dizziness, abdominal pain, skin rash, fevers, chills, night sweats, weight loss, swollen lymph nodes, body aches, joint swelling, chest pain, shortness of breath, mood changes. POSITIVE muscle aches  Objective  Blood pressure 102/74, pulse (!) 59, height 5\' 4"  (1.626 m), weight 119 lb (54 kg), SpO2 98 %.   General: [name], dressed appropriately.  HEENT: Pupils equal, extraocular movements intact  Respiratory: Patient's speak in full sentences and does not appear short of breath  Cardiovascular: [name], non tender, [name]  Neuro: Cranial nerves II through XII are intact, neurovascularly intact in all extremities with 2+ DTRs and 2+ pulses.  Gait normal with good balance and coordination.  MSK:  Non tender with full range of motion and good stability and symmetric strength and tone of shoulders, elbows, wrist, hip, knee and ankles bilaterally.  Mild arthritic changes of multiple joints Low back exam does show some tenderness to palpation in the paraspinal musculature lumbar spine right greater than left.  Tenderness over the right sacroiliac joint.  Mild  positive Corky Sox on the right side.  Improvement though in core strength from previous exam.  5 out of 5 strength of the lower extremities  Osteopathic findings C2 flexed rotated and side bent right T7 extended rotated and side bent left L2 flexed rotated and side bent right Sacrum right on right     Impression and Recommendations:     This case required medical decision making of moderate complexity. The above documentation has been reviewed and is accurate and complete Lyndal Pulley, DO       Note: This dictation was prepared with Dragon dictation along with smaller phrase technology. Any transcriptional errors that result from this process are unintentional.

## 2020-04-07 NOTE — Assessment & Plan Note (Addendum)
Has had some increasing in tightness.  Chronic problem.  Patient no longer having any radicular symptoms.  Discussed icing regimen and home exercise, which activities to doing which wants to avoid.  Medication management and gabapentin.  Also discussed diclofenac discussed increasing activity as tolerated.  Follow-up again in 4 to 8 weeks

## 2020-04-08 ENCOUNTER — Other Ambulatory Visit: Payer: Self-pay | Admitting: Family Medicine

## 2020-04-08 DIAGNOSIS — N644 Mastodynia: Secondary | ICD-10-CM

## 2020-04-08 DIAGNOSIS — Z853 Personal history of malignant neoplasm of breast: Secondary | ICD-10-CM

## 2020-04-09 ENCOUNTER — Telehealth: Payer: Self-pay | Admitting: Family Medicine

## 2020-04-09 NOTE — Telephone Encounter (Signed)
Left message for patient to call back and schedule Medicare Annual Wellness Visit (AWV) either virtually/audio only OR in office. Whatever the patients preference is.  No hx; please schedule at anytime with [facility]-Nurse Health Advisor at [facility].

## 2020-04-15 ENCOUNTER — Ambulatory Visit (INDEPENDENT_AMBULATORY_CARE_PROVIDER_SITE_OTHER): Payer: Medicare Other

## 2020-04-15 ENCOUNTER — Encounter: Payer: Self-pay | Admitting: Family Medicine

## 2020-04-15 ENCOUNTER — Ambulatory Visit
Admission: RE | Admit: 2020-04-15 | Discharge: 2020-04-15 | Disposition: A | Discharge status: Home / Self Care | Payer: Medicare Other | Source: Ambulatory Visit | Attending: Family Medicine | Admitting: Family Medicine

## 2020-04-15 ENCOUNTER — Other Ambulatory Visit: Payer: Self-pay

## 2020-04-15 DIAGNOSIS — M85851 Other specified disorders of bone density and structure, right thigh: Secondary | ICD-10-CM | POA: Diagnosis not present

## 2020-04-15 DIAGNOSIS — Z780 Asymptomatic menopausal state: Secondary | ICD-10-CM | POA: Diagnosis not present

## 2020-04-15 DIAGNOSIS — Z0000 Encounter for general adult medical examination without abnormal findings: Secondary | ICD-10-CM | POA: Diagnosis not present

## 2020-04-15 DIAGNOSIS — M8589 Other specified disorders of bone density and structure, multiple sites: Secondary | ICD-10-CM

## 2020-04-15 DIAGNOSIS — M810 Age-related osteoporosis without current pathological fracture: Secondary | ICD-10-CM | POA: Diagnosis not present

## 2020-04-15 NOTE — Progress Notes (Signed)
This visit is being conducted via phone call due to the COVID-19 pandemic. This patient has given me verbal consent via phone to conduct this visit, patient states they are participating from their home address. Some vital signs may be absent or patient reported.   Patient identification: identified by name, DOB, and current address.  Location provider: [facility], Office Persons participating in the virtual visit: Denman George LPN, patient, and Dr. Billey Chang     Subjective:   Gyselle Renschler is a 69 y.o. female who presents for an Initial Medicare Annual Wellness Visit.  Review of Systems     Cardiac Risk Factors include: advanced age (>28men, >47 women)    Objective:    There were no vitals filed for this visit. There is no height or weight on file to calculate BMI.  Advanced Directives 04/15/2020  Does Patient Have a Medical Advance Directive? Yes  Type of Advance Directive Living will;Healthcare Power of Attorney  Does patient want to make changes to medical advance directive? No - Patient declined  Copy of [place] in Chart? No - copy requested    Current Medications (verified) Outpatient Encounter Medications as of 04/15/2020  Medication Sig  . alendronate (FOSAMAX) 70 MG tablet TAKE 1 TABLET BY MOUTH ONCE WEEKLY ON EMPTY STOMACH WITH FULL GLASS OF WATER  . Calcium Citrate-Vitamin D (CALCIUM + D PO) Take by mouth.  . diclofenac (VOLTAREN) 75 MG EC tablet Take 1 tablet (75 mg total) by mouth 2 (two) times daily.  . diclofenac sodium (VOLTAREN) 1 % GEL Apply 2 grams topically to affected area qid  . doxycycline (VIBRA-TABS) 100 MG tablet Take 1 tablet (100 mg total) by mouth 2 (two) times daily.  Marland Kitchen gabapentin (NEURONTIN) 100 MG capsule Take 2 capsules (200 mg total) by mouth at bedtime.  Marland Kitchen MAGNESIUM PO Take by mouth.  . meloxicam (MOBIC) 15 MG tablet Take 1 tablet (15 mg total) by mouth daily.  Marland Kitchen NIACIN PO Take by mouth.  . sertraline (ZOLOFT) 25  MG tablet Take 1 tablet (25 mg total) by mouth daily.  . [DISCONTINUED] OVER THE COUNTER MEDICATION Chinese herbal supplement   No facility-administered encounter medications on file as of 04/15/2020.    Allergies (verified) Penicillins and Sulfa antibiotics   History: Past Medical History:  Diagnosis Date  . Chicken pox   . Depression   . Diverticulitis   . Personal history of chemotherapy   . Personal history of radiation therapy   . UTI (urinary tract infection)    Past Surgical History:  Procedure Laterality Date  . ABDOMINAL HYSTERECTOMY    . BREAST BIOPSY Left 1999  . BREAST LUMPECTOMY Left 2000  . TONSILECTOMY/ADENOIDECTOMY WITH MYRINGOTOMY     Family History  Problem Relation Age of Onset  . Alcohol abuse Mother   . Cancer Mother   . Alcohol abuse Father   . Depression Father   . Early death Father   . Alcohol abuse Brother   . Cancer Brother   . Breast cancer Neg Hx    Social History   Socioeconomic History  . Marital status: Married    Spouse name: Not on file  . Number of children: 0  . Years of education: Not on file  . Highest education level: Not on file  Occupational History  . Occupation: Retired     Comment: professor Engineer, manufacturing systems  Tobacco Use  . Smoking status: Former Smoker    Quit date: 12/27/1972  Years since quitting: 47.3  . Smokeless tobacco: Never Used  Substance and Sexual Activity  . Alcohol use: Yes  . Drug use: Not on file  . Sexual activity: Yes  Other Topics Concern  . Not on file  Social History Narrative   Pets:  2 cats adopted at the start of Covid    Social Determinants of Health   Financial Resource Strain:   . Difficulty of Paying Living Expenses:   Food Insecurity:   . Worried About Charity fundraiser in the Last Year:   . Arboriculturist in the Last Year:   Transportation Needs:   . Film/video editor (Medical):   Marland Kitchen Lack of Transportation (Non-Medical):   Physical Activity:   .  Days of Exercise per Week:   . Minutes of Exercise per Session:   Stress:   . Feeling of Stress :   Social Connections:   . Frequency of Communication with Friends and Family:   . Frequency of Social Gatherings with Friends and Family:   . Attends Religious Services:   . Active Member of Clubs or Organizations:   . Attends Archivist Meetings:   Marland Kitchen Marital Status:     Tobacco Counseling Counseling given: Not Answered   Clinical Intake:  Pre-visit preparation completed: Yes  Pain : No/denies pain  Diabetes: No  How often do you need to have someone help you when you read instructions, pamphlets, or other written materials from your doctor or pharmacy?: 1 - Never  Interpreter Needed?: No  Information entered by :: Denman George LPN   Activities of Daily Living In your present state of health, do you have any difficulty performing the following activities: 04/15/2020 04/01/2020  Hearing? N N  Comment - -  Vision? N N  Difficulty concentrating or making decisions? N N  Walking or climbing stairs? N N  Dressing or bathing? N N  Doing errands, shopping? N N  Preparing Food and eating ? N -  Using the Toilet? N -  In the past six months, have you accidently leaked urine? N -  Do you have problems with loss of bowel control? N -  Managing your Medications? N -  Managing your Finances? N -  Housekeeping or managing your Housekeeping? N -  Some recent data might be hidden     Immunizations and Health Maintenance Immunization History  Administered Date(s) Administered  . Fluad Quad(high Dose 65+) 09/04/2019  . Influenza-Unspecified 02/07/2015, 09/02/2018  . PFIZER SARS-COV-2 Vaccination 01/15/2020, 02/05/2020  . Pneumococcal-Unspecified 03/15/2018  . Tdap 02/11/2016  . Zoster Recombinat (Shingrix) 09/07/2011, 01/06/2019, 05/08/2019   There are no preventive care reminders to display for this patient.  Patient Care Team: [name], MD as PCP - General  (Family Medicine) Renda Rolls, Jennefer Bravo, MD as Referring Physician (Dermatology) Jola Schmidt, MD as Consulting Physician (Ophthalmology) Dr. Marrianne Mood, DDS as Consulting Physician (Dentistry) Lonzo Candy, AUD as Consulting Physician (Audiology) Lyndal Pulley, DO as Consulting Physician (Sports Medicine)  Indicate any recent Medical Services you may have received from other than [facility] providers in the past year (date may be approximate).     Assessment:   This is a routine wellness examination for [place].  Hearing/Vision screen No exam data present  Dietary issues and exercise activities discussed: Current Exercise Habits: Home exercise routine;Structured exercise class, Type of exercise: walking;yoga;Other - see comments(biking and pickleball), Time (Minutes): 45, Frequency (Times/Week): 5, Weekly Exercise (Minutes/Week): 225, Intensity: Moderate  Goals   None    Depression Screen PHQ 2/9 Scores 04/15/2020 12/19/2019 09/04/2019 09/04/2019 03/23/2019  PHQ - 2 Score 0 0 0 0 0  PHQ- 9 Score - - 0 0 0    Fall Risk Fall Risk  04/15/2020 04/01/2020 11/02/2019 03/23/2019  Falls in the past year? 0 0 0 0  Number falls in past yr: 0 0 0 -  Injury with Fall? 0 0 0 -  Follow up Falls evaluation completed;Education provided;Falls prevention discussed - - Falls evaluation completed    Is the patient's home free of loose throw rugs in walkways, pet beds, electrical cords, etc?   yes      Grab bars in the bathroom? yes      Handrails on the stairs?   yes      Adequate lighting?   yes   Cognitive Function: no cognitive concerns at this time    6CIT Screen 04/15/2020  What Year? 0 points  What month? 0 points  What time? 0 points  Count back from 20 0 points  Months in reverse 0 points  Repeat phrase 0 points  Total Score 0    Screening Tests Health Maintenance  Topic Date Due  . INFLUENZA VACCINE  07/27/2020  . MAMMOGRAM  12/31/2020  . DEXA SCAN  04/15/2022  .  COLONOSCOPY  12/07/2025  . TETANUS/TDAP  02/10/2026  . COVID-19 Vaccine  Completed  . Hepatitis C Screening  Completed  . PNA vac Low Risk Adult  Completed    Qualifies for Shingles Vaccine? Shingrix completed   Cancer Screenings: Lung: Low Dose CT Chest recommended if Age 70-80 years, 30 pack-year currently smoking OR have quit w/in 15years. Patient does not qualify. Breast: Up to date on Mammogram? Yes   Up to date of Bone Density/Dexa? Yes Colorectal: colonoscopy 12/08/15    Plan:  I have personally reviewed and addressed the Medicare Annual Wellness questionnaire and have noted the following in the patient's chart:  A. Medical and social history B. Use of alcohol, tobacco or illicit drugs  C. Current medications and supplements D. Functional ability and status E.  Nutritional status F.  Physical activity G. Advance directives H. List of other physicians I.  Hospitalizations, surgeries, and ER visits in previous 12 months J.  [place] such as hearing and vision if needed, cognitive and depression L. Referrals, records requested, and appointments- none   In addition, I have reviewed and discussed with patient certain preventive protocols, quality metrics, and best practice recommendations. A written personalized care plan for preventive services as well as general preventive health recommendations were provided to patient.   Signed,  Denman George, LPN  Nurse Health Advisor   Nurse Notes: no additional

## 2020-04-15 NOTE — Patient Instructions (Signed)
Allison [name] , Thank you for taking time to come for your Medicare Wellness Visit. I appreciate your ongoing commitment to your health goals. Please review the following plan we discussed and let me know if I can assist you in the future.   Screening recommendations/referrals: Colorectal Screening: up to date; last colonoscopy 12/08/15 Mammogram: up to date; last 01/01/20 Bone Density: up to date; last 04/15/20  Vision and Dental Exams: Recommended annual ophthalmology exams for early detection of glaucoma and other disorders of the eye Recommended annual dental exams for proper oral hygiene  Vaccinations: Influenza vaccine: completed 09/04/19 Pneumococcal vaccine: up to date; last 03/15/18 Tdap vaccine: up to date; last 02/11/16 . Shingles vaccine: Shingrix completed  Covid vaccine:  Completed   Advanced directives: Please bring a copy of your POA (Power of Attorney) and/or Living Will to your next appointment.  Goals: Recommend to drink at least 6-8 8oz glasses of water per day and consume a balanced diet rich in fresh fruits and vegetables.   Next appointment: Please schedule your Annual Wellness Visit with your Nurse Health Advisor in one year.  Preventive Care 31 Years and Older, Female Preventive care refers to lifestyle choices and visits with your health care provider that can promote health and wellness. What does preventive care include?  A yearly physical exam. This is also called an annual well check.  Dental exams once or twice a year.  Routine eye exams. Ask your health care provider how often you should have your eyes checked.  Personal lifestyle choices, including:  Daily care of your teeth and gums.  Regular physical activity.  Eating a healthy diet.  Avoiding tobacco and drug use.  Limiting alcohol use.  Practicing safe sex.  Taking low-dose aspirin every day if recommended by your health care provider.  Taking vitamin and mineral supplements as  recommended by your health care provider. What happens during an annual well check? The services and screenings done by your health care provider during your annual well check will depend on your age, overall health, lifestyle risk factors, and family history of disease. Counseling  Your health care provider may ask you questions about your:  Alcohol use.  Tobacco use.  Drug use.  Emotional well-being.  Home and relationship well-being.  Sexual activity.  Eating habits.  History of falls.  Memory and ability to understand (cognition).  Work and work Statistician.  Reproductive health. Screening  You may have the following tests or measurements:  Height, weight, and BMI.  Blood pressure.  Lipid and cholesterol levels. These may be checked every 5 years, or more frequently if you are over 55 years old.  Skin check.  Lung cancer screening. You may have this screening every year starting at age 24 if you have a 30-pack-year history of smoking and currently smoke or have quit within the past 15 years.  Fecal occult blood test (FOBT) of the stool. You may have this test every year starting at age 72.  Flexible sigmoidoscopy or colonoscopy. You may have a sigmoidoscopy every 5 years or a colonoscopy every 10 years starting at age 38.  Hepatitis C blood test.  Hepatitis B blood test.  Sexually transmitted disease (STD) testing.  Diabetes screening. This is done by checking your blood sugar (glucose) after you have not eaten for a while (fasting). You may have this done every 1-3 years.  Bone density scan. This is done to screen for osteoporosis. You may have this done starting at age 53.  Mammogram. This may be done every 1-2 years. Talk to your health care provider about how often you should have regular mammograms. Talk with your health care provider about your test results, treatment options, and if necessary, the need for more tests. Vaccines  Your health care  provider may recommend certain vaccines, such as:  Influenza vaccine. This is recommended every year.  Tetanus, diphtheria, and acellular pertussis (Tdap, Td) vaccine. You may need a Td booster every 10 years.  Zoster vaccine. You may need this after age 38.  Pneumococcal 13-valent conjugate (PCV13) vaccine. One dose is recommended after age 91.  Pneumococcal polysaccharide (PPSV23) vaccine. One dose is recommended after age 29. Talk to your health care provider about which screenings and vaccines you need and how often you need them. This information is not intended to replace advice given to you by your health care provider. Make sure you discuss any questions you have with your health care provider. Document Released: 01/09/2016 Document Revised: 09/01/2016 Document Reviewed: 10/14/2015 Elsevier Interactive Patient Education  2017 [employer] Prevention in the Home Falls can cause injuries. They can happen to people of all ages. There are many things you can do to make your home safe and to help prevent falls. What can I do on the outside of my home?  Regularly fix the edges of walkways and driveways and fix any cracks.  Remove anything that might make you trip as you walk through a door, such as a raised step or threshold.  Trim any bushes or trees on the path to your home.  Use bright outdoor lighting.  Clear any walking paths of anything that might make someone trip, such as rocks or tools.  Regularly check to see if handrails are loose or broken. Make sure that both sides of any steps have handrails.  Any raised decks and porches should have guardrails on the edges.  Have any leaves, snow, or ice cleared regularly.  Use sand or salt on walking paths during winter.  Clean up any spills in your garage right away. This includes oil or grease spills. What can I do in the bathroom?  Use night lights.  Install grab bars by the toilet and in the tub and shower. Do  not use towel bars as grab bars.  Use non-skid mats or decals in the tub or shower.  If you need to sit down in the shower, use a plastic, non-slip stool.  Keep the floor dry. Clean up any water that spills on the floor as soon as it happens.  Remove soap buildup in the tub or shower regularly.  Attach bath mats securely with double-sided non-slip rug tape.  Do not have throw rugs and other things on the floor that can make you trip. What can I do in the bedroom?  Use night lights.  Make sure that you have a light by your bed that is easy to reach.  Do not use any sheets or blankets that are too big for your bed. They should not hang down onto the floor.  Have a firm chair that has side arms. You can use this for support while you get dressed.  Do not have throw rugs and other things on the floor that can make you trip. What can I do in the kitchen?  Clean up any spills right away.  Avoid walking on wet floors.  Keep items that you use a lot in easy-to-reach places.  If you need to reach  something above you, use a strong step stool that has a grab bar.  Keep electrical cords out of the way.  Do not use floor polish or wax that makes floors slippery. If you must use wax, use non-skid floor wax.  Do not have throw rugs and other things on the floor that can make you trip. What can I do with my stairs?  Do not leave any items on the stairs.  Make sure that there are handrails on both sides of the stairs and use them. Fix handrails that are broken or loose. Make sure that handrails are as long as the stairways.  Check any carpeting to make sure that it is firmly attached to the stairs. Fix any carpet that is loose or worn.  Avoid having throw rugs at the top or bottom of the stairs. If you do have throw rugs, attach them to the floor with carpet tape.  Make sure that you have a light switch at the top of the stairs and the bottom of the stairs. If you do not have them,  ask someone to add them for you. What else can I do to help prevent falls?  Wear shoes that:  Do not have high heels.  Have rubber bottoms.  Are comfortable and fit you well.  Are closed at the toe. Do not wear sandals.  If you use a stepladder:  Make sure that it is fully opened. Do not climb a closed stepladder.  Make sure that both sides of the stepladder are locked into place.  Ask someone to hold it for you, if possible.  Clearly mark and make sure that you can see:  Any grab bars or handrails.  First and last steps.  Where the edge of each step is.  Use tools that help you move around (mobility aids) if they are needed. These include:  Canes.  Walkers.  Scooters.  Crutches.  Turn on the lights when you go into a dark area. Replace any light bulbs as soon as they burn out.  Set up your furniture so you have a clear path. Avoid moving your furniture around.  If any of your floors are uneven, fix them.  If there are any pets around you, be aware of where they are.  Review your medicines with your doctor. Some medicines can make you feel dizzy. This can increase your chance of falling. Ask your doctor what other things that you can do to help prevent falls. This information is not intended to replace advice given to you by your health care provider. Make sure you discuss any questions you have with your health care provider. Document Released: 10/09/2009 Document Revised: 05/20/2016 Document Reviewed: 01/17/2015 Elsevier Interactive Patient Education  2017 [employer].

## 2020-04-17 ENCOUNTER — Encounter: Payer: Self-pay | Admitting: Family Medicine

## 2020-04-19 ENCOUNTER — Other Ambulatory Visit: Payer: Self-pay | Admitting: Family Medicine

## 2020-04-19 DIAGNOSIS — M810 Age-related osteoporosis without current pathological fracture: Secondary | ICD-10-CM

## 2020-04-21 ENCOUNTER — Encounter: Payer: Self-pay | Admitting: Family Medicine

## 2020-04-21 ENCOUNTER — Ambulatory Visit
Admission: RE | Admit: 2020-04-21 | Discharge: 2020-04-21 | Disposition: A | Discharge status: Home / Self Care | Payer: Medicare Other | Source: Ambulatory Visit | Attending: Family Medicine | Admitting: Family Medicine

## 2020-04-21 ENCOUNTER — Other Ambulatory Visit: Payer: Self-pay

## 2020-04-21 DIAGNOSIS — N644 Mastodynia: Secondary | ICD-10-CM | POA: Diagnosis not present

## 2020-04-21 DIAGNOSIS — Z853 Personal history of malignant neoplasm of breast: Secondary | ICD-10-CM

## 2020-04-21 DIAGNOSIS — R922 Inconclusive mammogram: Secondary | ICD-10-CM | POA: Diagnosis not present

## 2020-04-26 ENCOUNTER — Encounter (HOSPITAL_COMMUNITY): Payer: Self-pay | Admitting: *Deleted

## 2020-04-26 ENCOUNTER — Ambulatory Visit (INDEPENDENT_AMBULATORY_CARE_PROVIDER_SITE_OTHER): Payer: Medicare Other

## 2020-04-26 ENCOUNTER — Other Ambulatory Visit: Payer: Self-pay

## 2020-04-26 ENCOUNTER — Ambulatory Visit (HOSPITAL_COMMUNITY)
Admission: EM | Admit: 2020-04-26 | Discharge: 2020-04-26 | Disposition: A | Discharge status: Home / Self Care | Payer: Medicare Other | Attending: Physician Assistant | Admitting: Physician Assistant

## 2020-04-26 DIAGNOSIS — S80211A Abrasion, right knee, initial encounter: Secondary | ICD-10-CM | POA: Diagnosis not present

## 2020-04-26 DIAGNOSIS — S80212A Abrasion, left knee, initial encounter: Secondary | ICD-10-CM

## 2020-04-26 DIAGNOSIS — S62245A Nondisplaced fracture of shaft of first metacarpal bone, left hand, initial encounter for closed fracture: Secondary | ICD-10-CM | POA: Diagnosis not present

## 2020-04-26 DIAGNOSIS — S60311A Abrasion of right thumb, initial encounter: Secondary | ICD-10-CM | POA: Diagnosis not present

## 2020-04-26 DIAGNOSIS — S62232A Other displaced fracture of base of first metacarpal bone, left hand, initial encounter for closed fracture: Secondary | ICD-10-CM | POA: Diagnosis not present

## 2020-04-26 HISTORY — DX: Other intervertebral disc displacement, lumbar region: M51.26

## 2020-04-26 HISTORY — DX: Malignant (primary) neoplasm, unspecified: C80.1

## 2020-04-26 MED ORDER — IBUPROFEN 800 MG PO TABS
800.0000 mg | ORAL_TABLET | Freq: Once | ORAL | Status: AC
  Administered 2020-04-26: 800 mg via ORAL

## 2020-04-26 MED ORDER — IBUPROFEN 800 MG PO TABS
ORAL_TABLET | ORAL | Status: AC
  Filled 2020-04-26: qty 1

## 2020-04-26 NOTE — Progress Notes (Signed)
Orthopedic Tech Progress Note Patient Details:  Allison [name] 03-23-1951 [id number] L. Thumb Spica was applied to pt. Patient ID: [name], female   DOB: 1951-02-03, 68 y.o.   MRN: [id number]   Majel Homer 04/26/2020, 1:29 PM
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
## 2020-04-26 NOTE — Discharge Instructions (Addendum)
Go to ED with worsening symptoms, follow up with ortho as soon as possible - call on Monday for appointment. Ice thumb, keep splint in place. Take ibuprofen or tylenol as needed for pain.

## 2020-04-26 NOTE — ED Notes (Signed)
Ortho tech notified of need for thumb spica.
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
## 2020-04-28 ENCOUNTER — Encounter: Payer: Self-pay | Admitting: Family Medicine

## 2020-04-28 DIAGNOSIS — S62232A Other displaced fracture of base of first metacarpal bone, left hand, initial encounter for closed fracture: Secondary | ICD-10-CM

## 2020-04-29 NOTE — Telephone Encounter (Signed)
Please call patient: [facility] is a great office but very busy. We can refer to see if that gets an appt for her OR Can refer to [facility] if she'd like.  Then place appropriate referral.  See Ed visit for diagnosis: thumb fracture.  Thanks.  cla

## 2020-04-29 NOTE — Telephone Encounter (Signed)
I called Allison [name] back.  Plan to refer to [facility] in [place]  Provided her with the phone number as well to call.

## 2020-04-30 ENCOUNTER — Other Ambulatory Visit: Payer: Self-pay

## 2020-04-30 DIAGNOSIS — S62509A Fracture of unspecified phalanx of unspecified thumb, initial encounter for closed fracture: Secondary | ICD-10-CM

## 2020-04-30 DIAGNOSIS — S62242A Displaced fracture of shaft of first metacarpal bone, left hand, initial encounter for closed fracture: Secondary | ICD-10-CM | POA: Diagnosis not present

## 2020-05-01 ENCOUNTER — Other Ambulatory Visit: Payer: Self-pay | Admitting: Orthopedic Surgery

## 2020-05-01 ENCOUNTER — Encounter (HOSPITAL_BASED_OUTPATIENT_CLINIC_OR_DEPARTMENT_OTHER): Payer: Self-pay | Admitting: Orthopedic Surgery

## 2020-05-02 ENCOUNTER — Encounter: Payer: Self-pay | Admitting: Family Medicine

## 2020-05-02 ENCOUNTER — Other Ambulatory Visit (HOSPITAL_COMMUNITY)
Admission: RE | Admit: 2020-05-02 | Discharge: 2020-05-02 | Disposition: A | Discharge status: Home / Self Care | Payer: Medicare Other | Source: Ambulatory Visit | Attending: Orthopedic Surgery | Admitting: Orthopedic Surgery

## 2020-05-02 DIAGNOSIS — Z01812 Encounter for preprocedural laboratory examination: Secondary | ICD-10-CM | POA: Insufficient documentation

## 2020-05-02 DIAGNOSIS — Z20822 Contact with and (suspected) exposure to covid-19: Secondary | ICD-10-CM | POA: Diagnosis not present

## 2020-05-02 NOTE — Progress Notes (Signed)

## 2020-05-03 LAB — SARS CORONAVIRUS 2 (TAT 6-24 HRS): SARS Coronavirus 2: NEGATIVE

## 2020-05-06 ENCOUNTER — Ambulatory Visit (HOSPITAL_BASED_OUTPATIENT_CLINIC_OR_DEPARTMENT_OTHER): Payer: Medicare Other | Admitting: Certified Registered Nurse Anesthetist

## 2020-05-06 ENCOUNTER — Encounter (HOSPITAL_BASED_OUTPATIENT_CLINIC_OR_DEPARTMENT_OTHER): Payer: Self-pay | Admitting: Orthopedic Surgery

## 2020-05-06 ENCOUNTER — Other Ambulatory Visit: Payer: Self-pay

## 2020-05-06 ENCOUNTER — Ambulatory Visit (HOSPITAL_BASED_OUTPATIENT_CLINIC_OR_DEPARTMENT_OTHER)
Admission: RE | Admit: 2020-05-06 | Discharge: 2020-05-06 | Disposition: A | Discharge status: Home / Self Care | Payer: Medicare Other | Source: Ambulatory Visit | Attending: Orthopedic Surgery | Admitting: Orthopedic Surgery

## 2020-05-06 ENCOUNTER — Encounter (HOSPITAL_BASED_OUTPATIENT_CLINIC_OR_DEPARTMENT_OTHER)
Admission: RE | Disposition: A | Discharge status: Home / Self Care | Payer: Self-pay | Source: Ambulatory Visit | Attending: Orthopedic Surgery

## 2020-05-06 DIAGNOSIS — S62241A Displaced fracture of shaft of first metacarpal bone, right hand, initial encounter for closed fracture: Secondary | ICD-10-CM | POA: Diagnosis not present

## 2020-05-06 DIAGNOSIS — Z87891 Personal history of nicotine dependence: Secondary | ICD-10-CM | POA: Insufficient documentation

## 2020-05-06 DIAGNOSIS — Z791 Long term (current) use of non-steroidal anti-inflammatories (NSAID): Secondary | ICD-10-CM | POA: Diagnosis not present

## 2020-05-06 DIAGNOSIS — Z853 Personal history of malignant neoplasm of breast: Secondary | ICD-10-CM | POA: Insufficient documentation

## 2020-05-06 DIAGNOSIS — Z9221 Personal history of antineoplastic chemotherapy: Secondary | ICD-10-CM | POA: Diagnosis not present

## 2020-05-06 DIAGNOSIS — S62242A Displaced fracture of shaft of first metacarpal bone, left hand, initial encounter for closed fracture: Secondary | ICD-10-CM | POA: Insufficient documentation

## 2020-05-06 DIAGNOSIS — Z79899 Other long term (current) drug therapy: Secondary | ICD-10-CM | POA: Diagnosis not present

## 2020-05-06 DIAGNOSIS — F329 Major depressive disorder, single episode, unspecified: Secondary | ICD-10-CM | POA: Diagnosis not present

## 2020-05-06 DIAGNOSIS — M1611 Unilateral primary osteoarthritis, right hip: Secondary | ICD-10-CM | POA: Diagnosis not present

## 2020-05-06 DIAGNOSIS — M5126 Other intervertebral disc displacement, lumbar region: Secondary | ICD-10-CM | POA: Diagnosis not present

## 2020-05-06 HISTORY — DX: Other specified postprocedural states: Z98.890

## 2020-05-06 HISTORY — PX: OPEN REDUCTION INTERNAL FIXATION (ORIF) METACARPAL: SHX6234

## 2020-05-06 HISTORY — DX: Nausea with vomiting, unspecified: R11.2

## 2020-05-06 HISTORY — DX: Other specified postprocedural states: R11.2

## 2020-05-06 SURGERY — OPEN REDUCTION INTERNAL FIXATION (ORIF) METACARPAL
Anesthesia: Monitor Anesthesia Care | Site: Finger | Laterality: Left

## 2020-05-06 MED ORDER — ONDANSETRON HCL 4 MG/2ML IJ SOLN
INTRAMUSCULAR | Status: AC
  Filled 2020-05-06: qty 2

## 2020-05-06 MED ORDER — ONDANSETRON HCL 4 MG/2ML IJ SOLN
INTRAMUSCULAR | Status: DC | PRN
  Administered 2020-05-06: 4 mg via INTRAVENOUS

## 2020-05-06 MED ORDER — FENTANYL CITRATE (PF) 100 MCG/2ML IJ SOLN
INTRAMUSCULAR | Status: AC
  Filled 2020-05-06: qty 2

## 2020-05-06 MED ORDER — ONDANSETRON HCL 4 MG/2ML IJ SOLN: 4.0000 mg | Freq: Once | INTRAMUSCULAR | Status: DC | PRN

## 2020-05-06 MED ORDER — MIDAZOLAM HCL 2 MG/2ML IJ SOLN
1.0000 mg | INTRAMUSCULAR | Status: DC | PRN
  Administered 2020-05-06: 1 mg via INTRAVENOUS

## 2020-05-06 MED ORDER — LACTATED RINGERS IV SOLN: INTRAVENOUS | Status: DC

## 2020-05-06 MED ORDER — DEXAMETHASONE SODIUM PHOSPHATE 10 MG/ML IJ SOLN
INTRAMUSCULAR | Status: DC | PRN
  Administered 2020-05-06: 5 mg

## 2020-05-06 MED ORDER — LIDOCAINE 2% (20 MG/ML) 5 ML SYRINGE
INTRAMUSCULAR | Status: AC
  Filled 2020-05-06: qty 5

## 2020-05-06 MED ORDER — MIDAZOLAM HCL 2 MG/2ML IJ SOLN
INTRAMUSCULAR | Status: AC
  Filled 2020-05-06: qty 2

## 2020-05-06 MED ORDER — HYDROCODONE-ACETAMINOPHEN 5-325 MG PO TABS: ORAL_TABLET | ORAL | 0 refills | Status: DC

## 2020-05-06 MED ORDER — VANCOMYCIN HCL IN DEXTROSE 1-5 GM/200ML-% IV SOLN
1000.0000 mg | INTRAVENOUS | Status: AC
  Administered 2020-05-06: 1000 mg via INTRAVENOUS

## 2020-05-06 MED ORDER — FENTANYL CITRATE (PF) 100 MCG/2ML IJ SOLN
50.0000 ug | INTRAMUSCULAR | Status: DC | PRN
  Administered 2020-05-06: 50 ug via INTRAVENOUS

## 2020-05-06 MED ORDER — ROPIVACAINE HCL 5 MG/ML IJ SOLN
INTRAMUSCULAR | Status: DC | PRN
  Administered 2020-05-06: 25 mL via PERINEURAL

## 2020-05-06 MED ORDER — PROPOFOL 500 MG/50ML IV EMUL
INTRAVENOUS | Status: DC | PRN
  Administered 2020-05-06: 75 ug/kg/min via INTRAVENOUS

## 2020-05-06 MED ORDER — VANCOMYCIN HCL IN DEXTROSE 1-5 GM/200ML-% IV SOLN
INTRAVENOUS | Status: AC
  Filled 2020-05-06: qty 200

## 2020-05-06 MED ORDER — PROPOFOL 10 MG/ML IV BOLUS
INTRAVENOUS | Status: AC
  Filled 2020-05-06: qty 20

## 2020-05-06 MED ORDER — PROPOFOL 10 MG/ML IV BOLUS
INTRAVENOUS | Status: DC | PRN
  Administered 2020-05-06: 30 mg via INTRAVENOUS
  Administered 2020-05-06: 20 mg via INTRAVENOUS

## 2020-05-06 SURGICAL SUPPLY — 53 items
BIT DRILL .045X (BIT) ×1 IMPLANT
BIT DRILL 1.1 (BIT) ×1
BIT DRL .045X (BIT) ×1
BLADE SURG 15 STRL LF DISP TIS (BLADE) ×2 IMPLANT
BLADE SURG 15 STRL SS (BLADE) ×2
BNDG ELASTIC 3X5.8 VLCR STR LF (GAUZE/BANDAGES/DRESSINGS) ×2 IMPLANT
BNDG ESMARK 4X9 LF (GAUZE/BANDAGES/DRESSINGS) ×2 IMPLANT
BNDG GAUZE ELAST 4 BULKY (GAUZE/BANDAGES/DRESSINGS) ×2 IMPLANT
CHLORAPREP W/TINT 26 (MISCELLANEOUS) ×2 IMPLANT
CORD BIPOLAR FORCEPS 12FT (ELECTRODE) ×2 IMPLANT
COVER BACK TABLE 60X90IN (DRAPES) ×2 IMPLANT
COVER MAYO STAND STRL (DRAPES) ×2 IMPLANT
COVER WAND RF STERILE (DRAPES) IMPLANT
CUFF TOURN SGL QUICK 18X4 (TOURNIQUET CUFF) ×2 IMPLANT
DRAPE EXTREMITY T 121X128X90 (DISPOSABLE) ×2 IMPLANT
DRAPE OEC MINIVIEW 54X84 (DRAPES) ×2 IMPLANT
DRAPE SURG 17X23 STRL (DRAPES) ×2 IMPLANT
GAUZE SPONGE 4X4 12PLY STRL (GAUZE/BANDAGES/DRESSINGS) ×2 IMPLANT
GAUZE XEROFORM 1X8 LF (GAUZE/BANDAGES/DRESSINGS) ×2 IMPLANT
GLOVE BIO SURGEON STRL SZ7.5 (GLOVE) ×2 IMPLANT
GLOVE BIOGEL PI IND STRL 7.0 (GLOVE) ×1 IMPLANT
GLOVE BIOGEL PI IND STRL 8 (GLOVE) ×1 IMPLANT
GLOVE BIOGEL PI INDICATOR 7.0 (GLOVE) ×1
GLOVE BIOGEL PI INDICATOR 8 (GLOVE) ×1
GLOVE SURG SS PI 7.0 STRL IVOR (GLOVE) ×2 IMPLANT
GOWN STRL REUS W/ TWL LRG LVL3 (GOWN DISPOSABLE) ×1 IMPLANT
GOWN STRL REUS W/TWL LRG LVL3 (GOWN DISPOSABLE) ×1
GOWN STRL REUS W/TWL XL LVL3 (GOWN DISPOSABLE) ×4 IMPLANT
NEEDLE HYPO 25X1 1.5 SAFETY (NEEDLE) IMPLANT
NS IRRIG 1000ML POUR BTL (IV SOLUTION) ×2 IMPLANT
PAD CAST 4YDX4 CTTN HI CHSV (CAST SUPPLIES) ×1 IMPLANT
PADDING CAST COTTON 4X4 STRL (CAST SUPPLIES) ×1
SCREW 1.4X12 (Screw) ×4 IMPLANT
SCREW CORT LP TI 1.4X11 (Screw) ×2 IMPLANT
SCREW CORT LP TI 1.4X13 (Screw) ×2 IMPLANT
SET BASIN DAY SURGERY F.S. (CUSTOM PROCEDURE TRAY) ×2 IMPLANT
SLEEVE SCD COMPRESS KNEE MED (MISCELLANEOUS) ×2 IMPLANT
SPLINT PLASTER CAST XFAST 3X15 (CAST SUPPLIES) IMPLANT
SPLINT PLASTER CAST XFAST 4X15 (CAST SUPPLIES) IMPLANT
SPLINT PLASTER XTRA FAST SET 4 (CAST SUPPLIES)
SPLINT PLASTER XTRA FASTSET 3X (CAST SUPPLIES)
STOCKINETTE 4X48 STRL (DRAPES) ×2 IMPLANT
SUT CHROMIC 4 0 PS 2 18 (SUTURE) ×2 IMPLANT
SUT ETHILON 3 0 PS 1 (SUTURE) IMPLANT
SUT ETHILON 4 0 PS 2 18 (SUTURE) ×2 IMPLANT
SUT MERSILENE 4 0 P 3 (SUTURE) IMPLANT
SUT VIC AB 3-0 PS1 18 (SUTURE)
SUT VIC AB 3-0 PS1 18XBRD (SUTURE) IMPLANT
SUT VICRYL 4-0 PS2 18IN ABS (SUTURE) ×2 IMPLANT
SYR BULB EAR ULCER 3OZ GRN STR (SYRINGE) ×2 IMPLANT
SYR CONTROL 10ML LL (SYRINGE) IMPLANT
TOWEL GREEN STERILE FF (TOWEL DISPOSABLE) ×4 IMPLANT
UNDERPAD 30X36 HEAVY ABSORB (UNDERPADS AND DIAPERS) ×2 IMPLANT
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
## 2020-05-06 NOTE — Progress Notes (Signed)
Assisted Dr. Witman with left, ultrasound guided, supraclavicular block. Side rails up, monitors on throughout procedure. See vital signs in flow sheet. Tolerated Procedure well. °

## 2020-05-06 NOTE — Anesthesia Procedure Notes (Signed)
Anesthesia Regional Block: Supraclavicular block   Pre-Anesthetic Checklist: ,, timeout performed, Correct Patient, Correct Site, Correct Laterality, Correct Procedure, Correct Position, site marked, Risks and benefits discussed,  Surgical consent,  Pre-op evaluation,  At surgeon's request and post-op pain management  Laterality: Left  Prep: chloraprep       Needles:  Injection technique: Single-shot  Needle Type: Echogenic Stimulator Needle     Needle Length: 9cm  Needle Gauge: 21     Additional Needles:   Procedures:,,,, ultrasound used (permanent image in chart),,,,  Narrative:  Start time: 05/06/2020 12:09 PM End time: 05/06/2020 12:12 PM Injection made incrementally with aspirations every 5 mL.  Performed by: Personally  Anesthesiologist: Lidia Collum, MD  Additional Notes: Monitors applied. Injection made in 5cc increments. No resistance to injection. Good needle visualization. Patient tolerated procedure well.

## 2020-05-06 NOTE — Anesthesia Postprocedure Evaluation (Signed)
Anesthesia Post Note  Patient: Allison [name]  Procedure(s) Performed: OPEN REDUCTION INTERNAL FIXATION (ORIF) LEFT THUMB METACARPAL FRACTURE (Left Finger)     Patient location during evaluation: PACU Anesthesia Type: Regional Level of consciousness: awake and alert Pain management: pain level controlled Vital Signs Assessment: post-procedure vital signs reviewed and stable Respiratory status: spontaneous breathing, nonlabored ventilation and respiratory function stable Cardiovascular status: blood pressure returned to baseline and stable Postop Assessment: no apparent nausea or vomiting Anesthetic complications: no    Last Vitals:  Vitals:   05/06/20 1345 05/06/20 1400  BP: 115/69 129/65  Pulse: (!) 52 (!) 52  Resp: 13 13  Temp: (!) 36.4 C   SpO2: 98% 94%    Last Pain:  Vitals:   05/06/20 1422  TempSrc:   PainSc: 0-No pain                 Lidia Collum

## 2020-05-06 NOTE — Discharge Instructions (Addendum)
[facility] Instructions Hand Surgery  Wound Care: Keep your hand elevated above the level of your heart.  Do not allow it to dangle by your side.  Keep the dressing dry and do not remove it unless your doctor advises you to do so.  He will usually change it at the time of your post-op visit.  Moving your fingers is advised to stimulate circulation but will depend on the site of your surgery.  If you have a splint applied, your doctor will advise you regarding movement.  Activity: Do not drive or operate machinery today.  Rest today and then you may return to your normal activity and work as indicated by your physician.  Diet:  Drink liquids today or eat a light diet.  You may resume a regular diet tomorrow.    General expectations: Pain for two to three days. Fingers may become slightly swollen.  Call your doctor if any of the following occur: Severe pain not relieved by pain medication. Elevated temperature. Dressing soaked with blood. Inability to move fingers. White or bluish color to fingers.   Post Anesthesia Home Care Instructions  Activity: Get plenty of rest for the remainder of the day. A responsible individual must stay with you for 24 hours following the procedure.  For the next 24 hours, DO NOT: -Drive a car -Operate machinery -Drink alcoholic beverages -Take any medication unless instructed by your physician -Make any legal decisions or sign important papers.  Meals: Start with liquid foods such as gelatin or soup. Progress to regular foods as tolerated. Avoid greasy, spicy, heavy foods. If nausea and/or vomiting occur, drink only clear liquids until the nausea and/or vomiting subsides. Call your physician if vomiting continues.  Special Instructions/Symptoms: Your throat may feel dry or sore from the anesthesia or the breathing tube placed in your throat during surgery. If this causes discomfort, gargle with warm salt water. The discomfort should disappear within  24 hours.     Regional Anesthesia Blocks  1. Numbness or the inability to move the "blocked" extremity may last from 3-48 hours after placement. The length of time depends on the medication injected and your individual response to the medication. If the numbness is not going away after 48 hours, call your surgeon.  2. The extremity that is blocked will need to be protected until the numbness is gone and the  Strength has returned. Because you cannot feel it, you will need to take extra care to avoid injury. Because it may be weak, you may have difficulty moving it or using it. You may not know what position it is in without looking at it while the block is in effect.  3. For blocks in the legs and feet, returning to weight bearing and walking needs to be done carefully. You will need to wait until the numbness is entirely gone and the strength has returned. You should be able to move your leg and foot normally before you try and bear weight or walk. You will need someone to be with you when you first try to ensure you do not fall and possibly risk injury.  4. Bruising and tenderness at the needle site are common side effects and will resolve in a few days.  5. Persistent numbness or new problems with movement should be communicated to the surgeon or the [facility] ([phone])/ [facility] ([phone]).  
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
## 2020-05-06 NOTE — Op Note (Signed)
I assisted Surgeon(s) and Role:    * [name], MD - Primary    Daryll Brod, MD - Assisting on the Procedure(s): OPEN REDUCTION INTERNAL FIXATION (ORIF) LEFT THUMB METACARPAL FRACTURE on 05/06/2020.  I provided assistance on this case as follows: approach to the metacarpal, reduction, stabilization, placement of the screws, closure of the wound and application of the dressings and splints. Electronically signed by: Daryll Brod, MD Date: 05/06/2020 Time: 1:38 PM

## 2020-05-06 NOTE — Anesthesia Preprocedure Evaluation (Signed)
Anesthesia Evaluation  Patient identified by MRN, date of birth, ID band Patient awake    Reviewed: Allergy & Precautions, NPO status , Patient's Chart, lab work & pertinent test results  History of Anesthesia Complications (+) PONVNegative for: history of anesthetic complications  Airway Mallampati: II  TM Distance: >3 FB Neck ROM: Full    Dental   Pulmonary neg pulmonary ROS, former smoker,    Pulmonary exam normal        Cardiovascular negative cardio ROS Normal cardiovascular exam     Neuro/Psych PSYCHIATRIC DISORDERS Depression negative neurological ROS     GI/Hepatic negative GI ROS, Neg liver ROS,   Endo/Other  negative endocrine ROS  Renal/GU negative Renal ROS  negative genitourinary   Musculoskeletal  (+) Arthritis ,   Abdominal   Peds  Hematology negative hematology ROS (+)   Anesthesia Other Findings  Remote hx breast cancer s/p lumpectomy/chemo/XRT  Reproductive/Obstetrics                             Anesthesia Physical Anesthesia Plan  ASA: II  Anesthesia Plan: MAC and Regional   Post-op Pain Management:  Regional for Post-op pain   Induction: Intravenous  PONV Risk Score and Plan: 3 and Propofol infusion, TIVA and Treatment may vary due to age or medical condition  Airway Management Planned: Natural Airway, Nasal Cannula and Simple Face Mask  Additional Equipment: None  Intra-op Plan:   Post-operative Plan:   Informed Consent: I have reviewed the patients History and Physical, chart, labs and discussed the procedure including the risks, benefits and alternatives for the proposed anesthesia with the patient or authorized representative who has indicated his/her understanding and acceptance.       Plan Discussed with:   Anesthesia Plan Comments:         Anesthesia Quick Evaluation

## 2020-05-06 NOTE — Transfer of Care (Signed)
Immediate Anesthesia Transfer of Care Note  Patient: Allison [name]  Procedure(s) Performed: OPEN REDUCTION INTERNAL FIXATION (ORIF) LEFT THUMB METACARPAL FRACTURE (Left Finger)  Patient Location: PACU  Anesthesia Type:MAC combined with regional for post-op pain  Level of Consciousness: awake, alert  and oriented  Airway & Oxygen Therapy: Patient Spontanous Breathing and Patient connected to face mask oxygen  Post-op Assessment: Report given to RN and Post -op Vital signs reviewed and stable  Post vital signs: Reviewed and stable  Last Vitals:  Vitals Value Taken Time  BP 115/69   Temp    Pulse 52 05/06/20 1344  Resp 13 05/06/20 1344  SpO2 98 % 05/06/20 1344  Vitals shown include unvalidated device data.  Last Pain:  Vitals:   05/06/20 1039  TempSrc: Oral  PainSc: 0-No pain      Patients Stated Pain Goal: 3 (07/68/08 8110)  Complications: No apparent anesthesia complications

## 2020-05-07 ENCOUNTER — Encounter: Payer: Self-pay | Admitting: *Deleted

## 2020-05-12 DIAGNOSIS — S62242D Displaced fracture of shaft of first metacarpal bone, left hand, subsequent encounter for fracture with routine healing: Secondary | ICD-10-CM | POA: Diagnosis not present

## 2020-05-15 DIAGNOSIS — H25013 Cortical age-related cataract, bilateral: Secondary | ICD-10-CM | POA: Diagnosis not present

## 2020-05-15 DIAGNOSIS — H5213 Myopia, bilateral: Secondary | ICD-10-CM | POA: Diagnosis not present

## 2020-05-20 ENCOUNTER — Ambulatory Visit (INDEPENDENT_AMBULATORY_CARE_PROVIDER_SITE_OTHER): Payer: Medicare Other | Admitting: Family Medicine

## 2020-05-20 ENCOUNTER — Encounter: Payer: Self-pay | Admitting: Family Medicine

## 2020-05-20 ENCOUNTER — Other Ambulatory Visit: Payer: Self-pay

## 2020-05-20 VITALS — BP 118/80 | HR 62 | Ht 64.0 in | Wt 120.0 lb

## 2020-05-20 DIAGNOSIS — M5126 Other intervertebral disc displacement, lumbar region: Secondary | ICD-10-CM

## 2020-05-20 DIAGNOSIS — M999 Biomechanical lesion, unspecified: Secondary | ICD-10-CM

## 2020-05-20 NOTE — Patient Instructions (Addendum)
See me again in 5-6 weeks Sorry about the thumb

## 2020-05-20 NOTE — Assessment & Plan Note (Addendum)

## 2020-05-20 NOTE — Assessment & Plan Note (Signed)
Known mild arthritic changes. Chronic problem with mild exacerbation. Patient has not been quite as active. Discussed medication management including meloxicam. Attempted osteopathic manipulation. Discussed range of motion exercises. Patient will start increasing activity and we discussed with patient about the possibility of formal physical therapy which patient declined at the moment. Follow-up again in 4 to 6 weeks

## 2020-05-20 NOTE — Progress Notes (Signed)
[name] Phone: [phone] Subjective:   [name], am serving as a scribe for Dr. Hulan Saas. This visit occurred during the SARS-CoV-2 public health emergency.  Safety protocols were in place, including screening questions prior to the visit, additional usage of staff PPE, and extensive cleaning of exam room while observing appropriate contact time as indicated for disinfecting solutions.   I'm seeing this patient by the request  of:  [name], MD  CC: Low back pain follow-up  [id number]  Allison [name] is a 69 y.o. female coming in with complaint of back pain. Last seen on 04/07/2020 for OMT. Since appointment, patient has had ORIF of left thumb. Back has been tight and painful for past week due to not being as active. Patient feels some mild tightness. Denies any severe pain though. Patient does states that he has not been active and is concerned about when she does increase activity how she goes from the field.      Past Medical History:  Diagnosis Date  . Cancer ([place])   . Chicken pox   . Depression   . Diverticulitis   . Lumbar herniated disc   . 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 DEXA 03/2020 Osteoporosis T = -2.5 at L1-2, lowest, femurs osteopenia, on fosamax. Continue fosamax.   . Personal history of chemotherapy   . Personal history of radiation therapy   . PONV (postoperative nausea and vomiting)   . UTI (urinary tract infection)    Past Surgical History:  Procedure Laterality Date  . ABDOMINAL HYSTERECTOMY    . BREAST BIOPSY Left 1999  . BREAST LUMPECTOMY Left 2000  . OPEN REDUCTION INTERNAL FIXATION (ORIF) METACARPAL Left 05/06/2020   Procedure: OPEN REDUCTION INTERNAL FIXATION (ORIF) LEFT THUMB METACARPAL FRACTURE;  Surgeon: [name], MD;   Location: [facility];  Service: Orthopedics;  Laterality: Left;  block in preop  . TONSILECTOMY/ADENOIDECTOMY WITH MYRINGOTOMY    . TONSILLECTOMY     Social History   Socioeconomic History  . Marital status: Married    Spouse name: Not on file  . Number of children: 0  . Years of education: Not on file  . Highest education level: Not on file  Occupational History  . Occupation: Retired     Comment: professor Engineer, manufacturing systems  Tobacco Use  . Smoking status: Former Smoker    Quit date: 12/27/1972    Years since quitting: 47.4  . Smokeless tobacco: Never Used  Substance and Sexual Activity  . Alcohol use: Yes    Comment: socially  . Drug use: Never  . Sexual activity: Not on file  Other Topics Concern  . Not on file  Social History Narrative   Pets:  2 cats adopted at the start of Covid    Social Determinants of Health   Financial Resource Strain:   . Difficulty of Paying Living Expenses:   Food Insecurity:   . Worried About Charity fundraiser in the Last Year:   . Arboriculturist in the Last Year:   Transportation Needs:   . Film/video editor (Medical):   Marland Kitchen Lack of Transportation (Non-Medical):   Physical Activity:   . Days of Exercise per Week:   . Minutes of Exercise  per Session:   Stress:   . Feeling of Stress :   Social Connections:   . Frequency of Communication with Friends and Family:   . Frequency of Social Gatherings with Friends and Family:   . Attends Religious Services:   . Active Member of Clubs or Organizations:   . Attends Archivist Meetings:   Marland Kitchen Marital Status:    Allergies  Allergen Reactions  . Penicillins     Childhood allergy  . Sulfa Antibiotics Hives and Swelling   Family History  Problem Relation Age of Onset  . Alcohol abuse Mother   . Cancer Mother   . Alcohol abuse Father   . Depression Father   . Early death Father   . Alcohol abuse Brother   . Cancer Brother   . Breast cancer  Neg Hx     Current Outpatient Medications (Endocrine & Metabolic):  .  alendronate (FOSAMAX) 70 MG tablet, TAKE 1 TABLET BY MOUTH ONCE WEEKLY ON EMPTY STOMACH WITH A FULL GLASS OF WATER    Current Outpatient Medications (Analgesics):  .  diclofenac (VOLTAREN) 75 MG EC tablet, Take 1 tablet (75 mg total) by mouth 2 (two) times daily. Marland Kitchen  HYDROcodone-acetaminophen (NORCO) 5-325 MG tablet, 1-2 tabs po q6 hours prn pain .  meloxicam (MOBIC) 15 MG tablet, Take 1 tablet (15 mg total) by mouth daily.   Current Outpatient Medications (Other):  .  Biotin 1 MG CAPS, Take by mouth. .  Calcium Citrate-Vitamin D (CALCIUM + D PO), Take by mouth. .  diclofenac sodium (VOLTAREN) 1 % GEL, Apply 2 grams topically to affected area qid .  doxycycline (VIBRA-TABS) 100 MG tablet, Take 1 tablet (100 mg total) by mouth 2 (two) times daily. Marland Kitchen  gabapentin (NEURONTIN) 100 MG capsule, Take 2 capsules (200 mg total) by mouth at bedtime. Marland Kitchen  MAGNESIUM PO, Take by mouth. Marland Kitchen  NIACIN PO, Take by mouth. .  NON FORMULARY, Tart cherry extract .  sertraline (ZOLOFT) 25 MG tablet, Take 1 tablet (25 mg total) by mouth daily. .  Turmeric 500 MG TABS, Take by mouth.   Reviewed prior external information including notes and imaging from  primary care provider As well as notes that were available from care everywhere and other healthcare systems.  Past medical history, social, surgical and family history all reviewed in electronic medical record.  No pertanent information unless stated regarding to the chief complaint.   Review of Systems:  No headache, visual changes, nausea, vomiting, diarrhea, constipation, dizziness, abdominal pain, skin rash, fevers, chills, night sweats, weight loss, swollen lymph nodes, body aches, joint swelling, chest pain, shortness of breath, mood changes. POSITIVE muscle aches  Objective  Blood pressure 118/80, pulse 62, height 5\' 4"  (1.626 m), weight 120 lb (54.4 kg), SpO2 98 %.   General: No  apparent distress alert and oriented x3 mood and affect normal, dressed appropriately.  HEENT: Pupils equal, extraocular movements intact  Respiratory: Patient's speak in full sentences and does not appear short of breath  Cardiovascular: No lower extremity edema, non tender, no erythema  Neuro: Cranial nerves II through XII are intact, neurovascularly intact in all extremities with 2+ DTRs and 2+ pulses.  Gait normal with good balance and coordination.  MSK: Patient is wearing a cast on her left hand.  Low back exam does have some mild degenerative scoliosis. Tender to palpation of paraspinal musculature right greater than left. Mild tightness with FABER test on the right. Mild tightness with the extension  of the back greater than 10 degrees. No radicular symptoms at the moment.  .Osteopathic findings   T7 extended rotated and side bent left L3 flexed rotated and side bent right Sacrum right on right     Impression and Recommendations:     This case required medical decision making of moderate complexity. The above documentation has been reviewed and is accurate and complete Lyndal Pulley, DO       Note: This dictation was prepared with Dragon dictation along with smaller phrase technology. Any transcriptional errors that result from this process are unintentional.

## 2020-06-24 ENCOUNTER — Other Ambulatory Visit: Payer: Self-pay

## 2020-06-24 ENCOUNTER — Ambulatory Visit (INDEPENDENT_AMBULATORY_CARE_PROVIDER_SITE_OTHER): Payer: Medicare Other | Admitting: Family Medicine

## 2020-06-24 ENCOUNTER — Encounter: Payer: Self-pay | Admitting: Family Medicine

## 2020-06-24 VITALS — BP 90/60 | HR 97 | Ht 64.0 in | Wt 120.0 lb

## 2020-06-24 DIAGNOSIS — M999 Biomechanical lesion, unspecified: Secondary | ICD-10-CM

## 2020-06-24 DIAGNOSIS — M810 Age-related osteoporosis without current pathological fracture: Secondary | ICD-10-CM | POA: Diagnosis not present

## 2020-06-24 NOTE — Progress Notes (Signed)
[facility] [address] [place] Phone: [phone] Subjective:   I [name] am serving as a Education administrator for Dr. Hulan Saas.  This visit occurred during the SARS-CoV-2 public health emergency.  Safety protocols were in place, including screening questions prior to the visit, additional usage of staff PPE, and extensive cleaning of exam room while observing appropriate contact time as indicated for disinfecting solutions.   I'm seeing this patient by the request  of:  [name], MD  CC: Back and neck pain follow-up  [id number]  Deissy Guilbert is a 69 y.o. female coming in with complaint of back and neck pain. OMT 05/20/2020. Patient states she is doing well today. No worsening pain.  Patient is still recovering from hand surgery.  Patient is trying to increase her activity and has been going back and doing yoga on a more regular basis.  States not as compliant though with the home exercises that she was doing previously.  Medications patient has been prescribed: Gabapentin which she is taking fairly regularly          Reviewed prior external information including notes and imaging from previsou exam, outside providers and external EMR if available.   As well as notes that were available from care everywhere and other healthcare systems.  Past medical history, social, surgical and family history all reviewed in electronic medical record.  No pertanent information unless stated regarding to the chief complaint.   Past Medical History:  Diagnosis Date  . Cancer ([place])   . Chicken pox   . Depression   . Diverticulitis   . Lumbar herniated disc   . 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 DEXA 03/2020 Osteoporosis T = -2.5 at L1-2, lowest, femurs osteopenia, on fosamax. Continue fosamax.   .  Personal history of chemotherapy   . Personal history of radiation therapy   . PONV (postoperative nausea and vomiting)   . UTI (urinary tract infection)     Allergies  Allergen Reactions  . Penicillins     Childhood allergy  . Sulfa Antibiotics Hives and Swelling     Review of Systems:  No headache, visual changes, nausea, vomiting, diarrhea, constipation, dizziness, abdominal pain, skin rash, fevers, chills, night sweats, weight loss, swollen lymph nodes, body aches, joint swelling, chest pain, shortness of breath, mood changes. POSITIVE muscle aches  Objective  Blood pressure 90/60, pulse 97, height 5\' 4"  (1.626 m), weight 120 lb (54.4 kg), SpO2 (!) 70 %.   General: No apparent distress alert and oriented x3 mood and affect normal, dressed appropriately.  HEENT: Pupils equal, extraocular movements intact  Respiratory: Patient's speak in full sentences and does not appear short of breath  Cardiovascular: No lower extremity edema, non tender, no erythema  Neuro: Cranial nerves II through XII are intact, neurovascularly intact in all extremities with 2+ DTRs and 2+ pulses.  Gait normal with good balance and coordination.  MSK:  Non tender with full range of motion and good stability and symmetric strength and tone of shoulders, elbows, wrist, hip, knee and ankles bilaterally.  Back - Normal skin, Spine with normal alignment and no deformity.  No tenderness to vertebral process palpation.  Paraspinous muscles are not tender and without spasm.   Range of motion is full at neck and lumbar sacral  regions  Osteopathic findings   C5 flexed rotated and side bent left T3 extended rotated and side bent right inhaled rib L2 flexed rotated and side bent right Sacrum right on right       Assessment and Plan:  Osteoporosis of lumbar spine Patient does have degenerative disc disease.  Has not been as compliant with the exercises at this time.  Mild exacerbation.  Discussed gabapentin.   Responding well to manipulation.  Follow-up again in 4 to 8 weeks    Nonallopathic problems  Decision today to treat with OMT was based on Physical Exam  After verbal consent patient was treated with HVLA, ME, FPR techniques in  thoracic, lumbar, and sacral  areas  Patient tolerated the procedure well with improvement in symptoms  Patient given exercises, stretches and lifestyle modifications  See medications in patient instructions if given  Patient will follow up in 4-8 weeks      The above documentation has been reviewed and is accurate and complete Lyndal Pulley, DO       Note: This dictation was prepared with Dragon dictation along with smaller phrase technology. Any transcriptional errors that result from this process are unintentional.

## 2020-06-24 NOTE — Patient Instructions (Signed)
Good to see you Overall doing great Can increase activity See me again in 7 weeks

## 2020-06-24 NOTE — Assessment & Plan Note (Signed)
Patient does have degenerative disc disease.  Has not been as compliant with the exercises at this time.  Mild exacerbation.  Discussed gabapentin.  Responding well to manipulation.  Follow-up again in 4 to 8 weeks

## 2020-07-22 DIAGNOSIS — D485 Neoplasm of uncertain behavior of skin: Secondary | ICD-10-CM | POA: Diagnosis not present

## 2020-07-22 DIAGNOSIS — Z85828 Personal history of other malignant neoplasm of skin: Secondary | ICD-10-CM | POA: Diagnosis not present

## 2020-07-22 DIAGNOSIS — L988 Other specified disorders of the skin and subcutaneous tissue: Secondary | ICD-10-CM | POA: Diagnosis not present

## 2020-07-28 DIAGNOSIS — S62242D Displaced fracture of shaft of first metacarpal bone, left hand, subsequent encounter for fracture with routine healing: Secondary | ICD-10-CM | POA: Diagnosis not present

## 2020-08-12 ENCOUNTER — Ambulatory Visit: Payer: Medicare Other | Admitting: Family Medicine

## 2020-08-12 ENCOUNTER — Encounter: Payer: Self-pay | Admitting: Family Medicine

## 2020-08-12 ENCOUNTER — Ambulatory Visit (INDEPENDENT_AMBULATORY_CARE_PROVIDER_SITE_OTHER): Payer: Medicare Other | Admitting: Family Medicine

## 2020-08-12 ENCOUNTER — Other Ambulatory Visit: Payer: Self-pay

## 2020-08-12 VITALS — BP 108/64 | HR 72 | Ht 64.0 in | Wt 121.0 lb

## 2020-08-12 DIAGNOSIS — M5126 Other intervertebral disc displacement, lumbar region: Secondary | ICD-10-CM

## 2020-08-12 DIAGNOSIS — M999 Biomechanical lesion, unspecified: Secondary | ICD-10-CM

## 2020-08-12 NOTE — Progress Notes (Signed)
[name] [address] [place] Phone: [phone] Subjective:    I'm seeing this patient by the request  of:  [name], MD  CC: Low back pain and hip pain follow-up  [id number]  Allison [name] is a 69 y.o. female coming in with complaint of back and neck pain. OMT 06/24/2020. Patient states that she has been stretching and getting into physical activity again. No flares since last visit.  Patient feels like she is doing relatively well.  Still responding well to her hand pain in surgery that was necessary.  Medications patient has been prescribed: Voltaren gel, diclofenac, gabapentin intermittently Tylenol regularly          Reviewed prior external information including notes and imaging from previsou exam, outside providers and external EMR if available.   As well as notes that were available from care everywhere and other healthcare systems.  Past medical history, social, surgical and family history all reviewed in electronic medical record.  No pertanent information unless stated regarding to the chief complaint.   Past Medical History:  Diagnosis Date  . Cancer ([place])   . Chicken pox   . Depression   . Diverticulitis   . Lumbar herniated disc   . 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 DEXA 03/2020 Osteoporosis T = -2.5 at L1-2, lowest, femurs osteopenia, on fosamax. Continue fosamax.   . Personal history of chemotherapy   . Personal history of radiation therapy   . PONV (postoperative nausea and vomiting)   . UTI (urinary tract infection)     Allergies  Allergen Reactions  . Penicillins     Childhood allergy  . Sulfa Antibiotics Hives and Swelling     Review of Systems:  No headache, visual changes, nausea, vomiting, diarrhea, constipation, dizziness, abdominal pain, skin  rash, fevers, chills, night sweats, weight loss, swollen lymph nodes, body aches, joint swelling, chest pain, shortness of breath, mood changes. POSITIVE muscle aches  Objective  Blood pressure 108/64, pulse 72, height 5\' 4"  (1.626 m), weight 121 lb (54.9 kg), SpO2 99 %.   General: No apparent distress alert and oriented x3 mood and affect normal, dressed appropriately.  HEENT: Pupils equal, extraocular movements intact  Respiratory: Patient's speak in full sentences and does not appear short of breath  Cardiovascular: No lower extremity edema, non tender, no erythema  Neuro: Cranial nerves II through XII are intact, neurovascularly intact in all extremities with 2+ DTRs and 2+ pulses.  Gait normal with good balance and coordination.  MSK:  Non tender with full range of motion and good stability and symmetric strength and tone of shoulders, elbows, wrist, hip, knee and ankles bilaterally.  Back -mild loss of lordosis.  Patient does have very mild degenerative scoliosis.  Negative straight leg test but tightness with straight leg test noted on the hamstring.  Patient does have some mild tightness of the [facility] as well.  Neurovascularly intact distally.  Near full range of motion.  Mild improvement in hip abductor strength  Osteopathic findings  T 8extended rotated and side bent left L2 flexed rotated and side bent right Sacrum right on right       Assessment and Plan:    Nonallopathic problems  Decision today to treat with OMT was based on Physical Exam  After verbal consent  patient was treated with HVLA, ME, FPR techniques in  thoracic, lumbar, and sacral  areas  Patient tolerated the procedure well with improvement in symptoms  Patient given exercises, stretches and lifestyle modifications  See medications in patient instructions if given  Patient will follow up in 4-8 weeks      The above documentation has been reviewed and is accurate and complete Lyndal Pulley,  DO       Note: This dictation was prepared with Dragon dictation along with smaller phrase technology. Any transcriptional errors that result from this process are unintentional.

## 2020-08-12 NOTE — Patient Instructions (Signed)
See me again in  

## 2020-08-12 NOTE — Assessment & Plan Note (Signed)
Chronic problem, mild exacerbation, diclofenac and gabapentin for breakthrough.  Patient though is improving overall.  Encouraged her to continue with the home exercises and icing regimen.  Come back again in 6 to 8 weeks

## 2020-08-25 ENCOUNTER — Other Ambulatory Visit: Payer: Self-pay | Admitting: Family Medicine

## 2020-08-25 DIAGNOSIS — F341 Dysthymic disorder: Secondary | ICD-10-CM

## 2020-09-04 ENCOUNTER — Other Ambulatory Visit: Payer: Self-pay

## 2020-09-04 ENCOUNTER — Encounter: Payer: Self-pay | Admitting: Family Medicine

## 2020-09-04 ENCOUNTER — Ambulatory Visit (INDEPENDENT_AMBULATORY_CARE_PROVIDER_SITE_OTHER): Payer: Medicare Other | Admitting: Family Medicine

## 2020-09-04 VITALS — BP 102/70 | HR 61 | Temp 98.2°F | Resp 18 | Ht 64.0 in | Wt 119.6 lb

## 2020-09-04 DIAGNOSIS — F341 Dysthymic disorder: Secondary | ICD-10-CM

## 2020-09-04 DIAGNOSIS — M5126 Other intervertebral disc displacement, lumbar region: Secondary | ICD-10-CM | POA: Diagnosis not present

## 2020-09-04 DIAGNOSIS — Z136 Encounter for screening for cardiovascular disorders: Secondary | ICD-10-CM | POA: Diagnosis not present

## 2020-09-04 DIAGNOSIS — Z23 Encounter for immunization: Secondary | ICD-10-CM

## 2020-09-04 DIAGNOSIS — Z853 Personal history of malignant neoplasm of breast: Secondary | ICD-10-CM | POA: Diagnosis not present

## 2020-09-04 DIAGNOSIS — M810 Age-related osteoporosis without current pathological fracture: Secondary | ICD-10-CM | POA: Diagnosis not present

## 2020-09-04 LAB — CBC WITH DIFFERENTIAL/PLATELET
Absolute Monocytes: 421 cells/uL (ref 200–950)
Basophils Absolute: 22 cells/uL (ref 0–200)
Basophils Relative: 0.5 %
Eosinophils Absolute: 39 cells/uL (ref 15–500)
Eosinophils Relative: 0.9 %
HCT: 40.3 % (ref 35.0–45.0)
Hemoglobin: 13.6 g/dL (ref 11.7–15.5)
Lymphs Abs: 1183 cells/uL (ref 850–3900)
MCH: 31.6 pg (ref 27.0–33.0)
MCHC: 33.7 g/dL (ref 32.0–36.0)
MCV: 93.7 fL (ref 80.0–100.0)
MPV: 9.7 fL (ref 7.5–12.5)
Monocytes Relative: 9.8 %
Neutro Abs: 2636 cells/uL (ref 1500–7800)
Neutrophils Relative %: 61.3 %
Platelets: 327 10*3/uL (ref 140–400)
RBC: 4.3 10*6/uL (ref 3.80–5.10)
RDW: 13 % (ref 11.0–15.0)
Total Lymphocyte: 27.5 %
WBC: 4.3 10*3/uL (ref 3.8–10.8)

## 2020-09-04 LAB — COMPLETE METABOLIC PANEL WITH GFR
AG Ratio: 1.9 (calc) (ref 1.0–2.5)
ALT: 11 U/L (ref 6–29)
AST: 23 U/L (ref 10–35)
Albumin: 4.6 g/dL (ref 3.6–5.1)
Alkaline phosphatase (APISO): 35 U/L — ABNORMAL LOW (ref 37–153)
BUN: 14 mg/dL (ref 7–25)
CO2: 27 mmol/L (ref 20–32)
Calcium: 10.3 mg/dL (ref 8.6–10.4)
Chloride: 97 mmol/L — ABNORMAL LOW (ref 98–110)
Creat: 0.8 mg/dL (ref 0.50–0.99)
GFR, Est African American: 87 mL/min/{1.73_m2} (ref >=60)
GFR, Est Non African American: 75 mL/min/{1.73_m2} (ref >=60)
Globulin: 2.4 g/dL (calc) (ref 1.9–3.7)
Glucose, Bld: 82 mg/dL (ref 65–99)
Potassium: 4.6 mmol/L (ref 3.5–5.3)
Sodium: 132 mmol/L — ABNORMAL LOW (ref 135–146)
Total Bilirubin: 0.6 mg/dL (ref 0.2–1.2)
Total Protein: 7 g/dL (ref 6.1–8.1)

## 2020-09-04 LAB — LIPID PANEL
Cholesterol: 236 mg/dL — ABNORMAL HIGH (ref <200)
HDL: 96 mg/dL (ref >=50)
LDL Cholesterol (Calc): 122 mg/dL (calc) — ABNORMAL HIGH
Non-HDL Cholesterol (Calc): 140 mg/dL (calc) — ABNORMAL HIGH (ref <130)
Total CHOL/HDL Ratio: 2.5 (calc) (ref <5.0)
Triglycerides: 83 mg/dL (ref <150)

## 2020-09-04 MED ORDER — GABAPENTIN 100 MG PO CAPS
200.0000 mg | ORAL_CAPSULE | ORAL | Status: DC | PRN
Start: 2020-09-04 — End: 2021-01-20

## 2020-09-04 MED ORDER — MELOXICAM 15 MG PO TABS
15.0000 mg | ORAL_TABLET | ORAL | Status: DC | PRN
Start: 2020-09-04 — End: 2020-10-01
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
## 2020-09-04 NOTE — Patient Instructions (Addendum)
Please return in 12 months for your annual complete physical; please come fasting.  I will release your lab results to you on your MyChart account with further instructions. Please reply with any questions.  Today you were given your flu (high dose) vaccination.   If you have any questions or concerns, please don't hesitate to send me a message via MyChart or call the office at [phone]. Thank you for visiting with [place] today! It's our pleasure caring for you.   Preventive Care 69 Years and Older, Female Preventive care refers to lifestyle choices and visits with your health care provider that can promote health and wellness. This includes:  A yearly physical exam. This is also called an annual well check.  Regular dental and eye exams.  Immunizations.  Screening for certain conditions.  Healthy lifestyle choices, such as diet and exercise. What can I expect for my preventive care visit? Physical exam Your health care provider will check:  Height and weight. These may be used to calculate body mass index (BMI), which is a measurement that tells if you are at a healthy weight.  Heart rate and blood pressure.  Your skin for abnormal spots. Counseling Your health care provider may ask you questions about:  Alcohol, tobacco, and drug use.  Emotional well-being.  Home and relationship well-being.  Sexual activity.  Eating habits.  History of falls.  Memory and ability to understand (cognition).  Work and work Statistician.  Pregnancy and menstrual history. What immunizations do I need?  Influenza (flu) vaccine  This is recommended every year. Tetanus, diphtheria, and pertussis (Tdap) vaccine  You may need a Td booster every 10 years. Varicella (chickenpox) vaccine  You may need this vaccine if you have not already been vaccinated. Zoster (shingles) vaccine  You may need this after age 18. Pneumococcal conjugate (PCV13) vaccine  One dose is recommended  after age 37. Pneumococcal polysaccharide (PPSV23) vaccine  One dose is recommended after age 66. Measles, mumps, and rubella (MMR) vaccine  You may need at least one dose of MMR if you were born in 1957 or later. You may also need a second dose. Meningococcal conjugate (MenACWY) vaccine  You may need this if you have certain conditions. Hepatitis A vaccine  You may need this if you have certain conditions or if you travel or work in places where you may be exposed to hepatitis A. Hepatitis B vaccine  You may need this if you have certain conditions or if you travel or work in places where you may be exposed to hepatitis B. Haemophilus influenzae type b (Hib) vaccine  You may need this if you have certain conditions. You may receive vaccines as individual doses or as more than one vaccine together in one shot (combination vaccines). Talk with your health care provider about the risks and benefits of combination vaccines. What tests do I need? Blood tests  Lipid and cholesterol levels. These may be checked every 5 years, or more frequently depending on your overall health.  Hepatitis C test.  Hepatitis B test. Screening  Lung cancer screening. You may have this screening every year starting at age 39 if you have a 30-pack-year history of smoking and currently smoke or have quit within the past 15 years.  Colorectal cancer screening. All adults should have this screening starting at age 61 and continuing until age 20. Your health care provider may recommend screening at age 21 if you are at increased risk. You will have tests every 1-10  years, depending on your results and the type of screening test.  Diabetes screening. This is done by checking your blood sugar (glucose) after you have not eaten for a while (fasting). You may have this done every 1-3 years.  Mammogram. This may be done every 1-2 years. Talk with your health care provider about how often you should have regular  mammograms.  BRCA-related cancer screening. This may be done if you have a family history of breast, ovarian, tubal, or peritoneal cancers. Other tests  Sexually transmitted disease (STD) testing.  Bone density scan. This is done to screen for osteoporosis. You may have this done starting at age 38. Follow these instructions at home: Eating and drinking  Eat a diet that includes fresh fruits and vegetables, whole grains, lean protein, and low-fat dairy products. Limit your intake of foods with high amounts of sugar, saturated fats, and salt.  Take vitamin and mineral supplements as recommended by your health care provider.  Do not drink alcohol if your health care provider tells you not to drink.  If you drink alcohol: ? Limit how much you have to 0-1 drink a day. ? Be aware of how much alcohol is in your drink. In the [place] one drink equals one 12 oz bottle of beer (355 mL), one 5 oz glass of wine (148 mL), or one 1 oz glass of hard liquor (44 mL). Lifestyle  Take daily care of your teeth and gums.  Stay active. Exercise for at least 30 minutes on 5 or more days each week.  Do not use any products that contain nicotine or tobacco, such as cigarettes, e-cigarettes, and chewing tobacco. If you need help quitting, ask your health care provider.  If you are sexually active, practice safe sex. Use a condom or other form of protection in order to prevent STIs (sexually transmitted infections).  Talk with your health care provider about taking a low-dose aspirin or statin. What's next?  Go to your health care provider once a year for a well check visit.  Ask your health care provider how often you should have your eyes and teeth checked.  Stay up to date on all vaccines. This information is not intended to replace advice given to you by your health care provider. Make sure you discuss any questions you have with your health care provider. Document Revised: 12/07/2018 Document  Reviewed: 12/07/2018 Elsevier Patient Education  2020 [employer].

## 2020-09-04 NOTE — Addendum Note (Signed)
Addended by: Thomes Cake on: 09/04/2020 09:13 AM   Modules accepted: Orders

## 2020-09-09 ENCOUNTER — Telehealth: Payer: Self-pay | Admitting: Family Medicine

## 2020-09-09 NOTE — Telephone Encounter (Signed)
Pt is returning call about lab work

## 2020-09-09 NOTE — Telephone Encounter (Signed)
LMOVM to return call.

## 2020-09-09 NOTE — Telephone Encounter (Signed)
Patient is calling in about lab results.

## 2020-09-10 NOTE — Telephone Encounter (Signed)
Patient given lab results. 

## 2020-09-22 DIAGNOSIS — S62242D Displaced fracture of shaft of first metacarpal bone, left hand, subsequent encounter for fracture with routine healing: Secondary | ICD-10-CM | POA: Diagnosis not present

## 2020-09-30 ENCOUNTER — Ambulatory Visit: Payer: Medicare Other | Attending: Internal Medicine

## 2020-09-30 DIAGNOSIS — Z23 Encounter for immunization: Secondary | ICD-10-CM

## 2020-09-30 NOTE — Progress Notes (Signed)
   Cov[facility] Vaccination [facility]  Name:  Danasia Baker    MRN: [id number] DOB: 05-Aug-1951  09/30/2020  Ms. Warda was observed post Covid-19 immunization for 15 minutes without incident. She was provided with Vaccine Information Sheet and instruction to access the V-Safe system.   Ms. Tristan was instructed to call 911 with any severe reactions post vaccine: Marland Kitchen Difficulty breathing  . Swelling of face and throat  . A fast heartbeat  . A bad rash all over body  . Dizziness and weakness

## 2020-10-01 ENCOUNTER — Other Ambulatory Visit: Payer: Self-pay | Admitting: Family Medicine

## 2020-10-01 MED ORDER — MELOXICAM 15 MG PO TABS: 15.0000 mg | ORAL_TABLET | ORAL | 0 refills | Status: DC | PRN

## 2020-10-01 NOTE — Telephone Encounter (Signed)
Pt requesting refill, please advise Dr. Jonni Sanger is out of the office.

## 2020-10-15 NOTE — Progress Notes (Signed)
[name] Phone: [phone] Subjective:   Allison [name], am serving as a scribe for Dr. Hulan Saas. This visit occurred during the SARS-CoV-2 public health emergency.  Safety protocols were in place, including screening questions prior to the visit, additional usage of staff PPE, and extensive cleaning of exam room while observing appropriate contact time as indicated for disinfecting solutions.   I'm seeing this patient by the request  of:  [name], MD  CC: Back and neck pain follow-up  [id number]  Allison [name] is a 69 y.o. female coming in with complaint of back and neck pain. OMT 08/12/2020. Patient states that she has been playing pickleball more often recently. Painfree when playing. Last week her pain increased. Also complains of thoracic pain that is radiating into anterior rib cage. Pain wakes patient up at night. History of costochondritis. This week her pain has improved. Did take gabapentin for 5 days and meloxicam in the morning for 5 days.  Denies any fevers, chills, any abnormal weight loss          Reviewed prior external information including notes and imaging from previsou exam, outside providers and external EMR if available.   As well as notes that were available from care everywhere and other healthcare systems.  Past medical history, social, surgical and family history all reviewed in electronic medical record.  [name]   Past Medical History:  Diagnosis Date  . Cancer ([place])   . Chicken pox   . Depression   . Diverticulitis   . Lumbar herniated disc   . 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 DEXA 03/2020 Osteoporosis T = -2.5 at L1-2, lowest, femurs osteopenia, on fosamax.  Continue fosamax.   . Personal history of chemotherapy   . Personal history of radiation therapy   . PONV (postoperative nausea and vomiting)   . UTI (urinary tract infection)     Allergies  Allergen Reactions  . Penicillins     Childhood allergy  . Sulfa Antibiotics Hives and Swelling     Review of Systems:  [name], visual changes, nausea, vomiting, diarrhea, constipation, dizziness, abdominal pain, skin rash, fevers, chills, night sweats, weight loss, swollen lymph nodes, body aches, joint swelling, chest pain, shortness of breath, mood changes. POSITIVE muscle aches  Objective  Blood pressure 92/62, pulse 75, height 5\' 4"  (1.626 m), weight 122 lb (55.3 kg), SpO2 99 %.   General: [name], dressed appropriately.  HEENT: Pupils equal, extraocular movements intact  Respiratory: Patient's speak in full sentences and does not appear short of breath  Cardiovascular: [name], non tender, [name]  Neuro: Cranial nerves II through XII are intact, neurovascularly intact in all extremities with 2+ DTRs and 2+ pulses.  Gait normal with good balance and coordination.  MSK:  Non tender with full range of motion and good stability and symmetric strength and tone of shoulders, elbows, wrist, hip, knee and ankles bilaterally.  Back -thoracic back does have some increasing tightness in the parascapular region on the right side more than the left.  Patient is able to take a deep breath.  [name]  Continued tightness  of the paraspinal musculature of the lumbar spine  Osteopathic findings   T8 extended rotated and side bent right with inhaled rib L2 flexed rotated and side bent right Sacrum right on right       Assessment and Plan:  Osteoporosis of lumbar spine Known osteoporosis.  Continuing to do the light manipulation.  Patient is responding fairly well.  Patient did have more of a  tightness in the thoracic spine today.  We discussed posture and ergonomics and proper lifting mechanics.  Continue with the gabapentin and the meloxicam as needed.  Follow-up with me again in 6 weeks    Nonallopathic problems  Decision today to treat with OMT was based on Physical Exam  After verbal consent patient was treated with  ME, FPR techniques in cervical, rib, thoracic, lumbar, and sacral  areas  Patient tolerated the procedure well with improvement in symptoms  Patient given exercises, stretches and lifestyle modifications  See medications in patient instructions if given  Patient will follow up in 4-8 weeks      The above documentation has been reviewed and is accurate and complete Lyndal Pulley, DO       Note: This dictation was prepared with Dragon dictation along with smaller phrase technology. Any transcriptional errors that result from this process are unintentional.

## 2020-10-16 ENCOUNTER — Encounter: Payer: Self-pay | Admitting: Family Medicine

## 2020-10-16 ENCOUNTER — Ambulatory Visit (INDEPENDENT_AMBULATORY_CARE_PROVIDER_SITE_OTHER): Payer: Medicare Other | Admitting: Family Medicine

## 2020-10-16 ENCOUNTER — Other Ambulatory Visit: Payer: Self-pay

## 2020-10-16 VITALS — BP 92/62 | HR 75 | Ht 64.0 in | Wt 122.0 lb

## 2020-10-16 DIAGNOSIS — M999 Biomechanical lesion, unspecified: Secondary | ICD-10-CM | POA: Diagnosis not present

## 2020-10-16 DIAGNOSIS — M810 Age-related osteoporosis without current pathological fracture: Secondary | ICD-10-CM | POA: Diagnosis not present

## 2020-10-16 NOTE — Patient Instructions (Addendum)
Good to see you  Slipped rib today yoga wheel could be helpful Keep up the good work See me again in 8 weeks

## 2020-10-16 NOTE — Assessment & Plan Note (Signed)
Known osteoporosis.  Continuing to do the light manipulation.  Patient is responding fairly well.  Patient did have more of a tightness in the thoracic spine today.  We discussed posture and ergonomics and proper lifting mechanics.  Continue with the gabapentin and the meloxicam as needed.  Follow-up with me again in 6 weeks

## 2020-10-30 DIAGNOSIS — L570 Actinic keratosis: Secondary | ICD-10-CM | POA: Diagnosis not present

## 2020-10-30 DIAGNOSIS — Z85828 Personal history of other malignant neoplasm of skin: Secondary | ICD-10-CM | POA: Diagnosis not present

## 2020-10-30 DIAGNOSIS — L820 Inflamed seborrheic keratosis: Secondary | ICD-10-CM | POA: Diagnosis not present

## 2020-12-10 NOTE — Progress Notes (Signed)
[place] [facility] [place] Phone: [phone] Subjective:   [name], am serving as a scribe for Dr. Hulan Saas. This visit occurred during the SARS-CoV-2 public health emergency.  Safety protocols were in place, including screening questions prior to the visit, additional usage of staff PPE, and extensive cleaning of exam room while observing appropriate contact time as indicated for disinfecting solutions.   I'm seeing this patient by the request  of:  [name], MD  CC: Back and neck pain follow-up  [id number]  Lakeishia Truluck is a 69 y.o. female coming in with complaint of back and neck pain. OMT 10/16/2020. Patient states that her pain increased over Thanksgiving break. Patient was playing a lot of pickleball and was not taking gabapentin due to being at the beach. Was having pain in right oblique. Has begun gabapentin again and her pain has subsided.  Otherwise feeling relatively normal.  Medications patient has been prescribed: Gabapentin          Reviewed prior external information including notes and imaging from previsou exam, outside providers and external EMR if available.   As well as notes that were available from care everywhere and other healthcare systems.  Past medical history, social, surgical and family history all reviewed in electronic medical record.  No pertanent information unless stated regarding to the chief complaint.   Past Medical History:  Diagnosis Date  . Cancer ([place])   . Chicken pox   . Depression   . Diverticulitis   . Lumbar herniated disc   . 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 DEXA 03/2020 Osteoporosis T = -2.5 at L1-2, lowest, femurs osteopenia, on fosamax. Continue fosamax.   . Personal history of chemotherapy   . Personal history of  radiation therapy   . PONV (postoperative nausea and vomiting)   . UTI (urinary tract infection)     Allergies  Allergen Reactions  . Penicillins     Childhood allergy  . Sulfa Antibiotics Hives and Swelling     Review of Systems:  No headache, visual changes, nausea, vomiting, diarrhea, constipation, dizziness, abdominal pain, skin rash, fevers, chills, night sweats, weight loss, swollen lymph nodes, body aches, joint swelling, chest pain, shortness of breath, mood changes. POSITIVE muscle aches  Objective  Blood pressure 110/64, pulse 73, height 5\' 4"  (1.626 m), weight 121 lb (54.9 kg), SpO2 98 %.   General: No apparent distress alert and oriented x3 mood and affect normal, dressed appropriately.  HEENT: Pupils equal, extraocular movements intact  Respiratory: Patient's speak in full sentences and does not appear short of breath  Cardiovascular: No lower extremity edema, non tender, no erythema  Neuro: Cranial nerves II through XII are intact, neurovascularly intact in all extremities with 2+ DTRs and 2+ pulses.  Gait normal with good balance and coordination.  MSK:  Non tender with full range of motion and good stability and symmetric strength and tone of shoulders, elbows, wrist, hip, knee and ankles bilaterally.  Back -patient does have tightness more in the thoracolumbar juncture right greater than left.  Patient has some mild tightness with FABER test.  Negative straight leg test.  Osteopathic findings  T9 extended rotated and side bent left T10 extended rotated and side bent right with inhaled rib L2 flexed rotated and side  bent right Sacrum right on right       Assessment and Plan:  Osteoporosis of lumbar spine Patient is doing very well at this time.  Osteopathic manipulation seems to be doing well.  Patient doing well even with some mild HVLA.  We discussed icing regimen and home exercises.  Continue with core strengthening.  Patient can take gabapentin at night  and meloxicam as needed.  Follow-up with me again in 6 to 8 weeks    Nonallopathic problems  Decision today to treat with OMT was based on Physical Exam  After verbal consent patient was treated with HVLA, ME, FPR techniques in  rib, thoracic, lumbar, and sacral  areas  Patient tolerated the procedure well with improvement in symptoms  Patient given exercises, stretches and lifestyle modifications  See medications in patient instructions if given  Patient will follow up in 4-8 weeks      The above documentation has been reviewed and is accurate and complete Lyndal Pulley, DO       Note: This dictation was prepared with Dragon dictation along with smaller phrase technology. Any transcriptional errors that result from this process are unintentional.

## 2020-12-11 ENCOUNTER — Other Ambulatory Visit: Payer: Self-pay

## 2020-12-11 ENCOUNTER — Encounter: Payer: Self-pay | Admitting: Family Medicine

## 2020-12-11 ENCOUNTER — Ambulatory Visit (INDEPENDENT_AMBULATORY_CARE_PROVIDER_SITE_OTHER): Payer: Medicare Other | Admitting: Family Medicine

## 2020-12-11 VITALS — BP 110/64 | HR 73 | Ht 64.0 in | Wt 121.0 lb

## 2020-12-11 DIAGNOSIS — M999 Biomechanical lesion, unspecified: Secondary | ICD-10-CM | POA: Diagnosis not present

## 2020-12-11 DIAGNOSIS — M810 Age-related osteoporosis without current pathological fracture: Secondary | ICD-10-CM

## 2020-12-11 NOTE — Assessment & Plan Note (Signed)
Patient is doing very well at this time.  Osteopathic manipulation seems to be doing well.  Patient doing well even with some mild HVLA.  We discussed icing regimen and home exercises.  Continue with core strengthening.  Patient can take gabapentin at night and meloxicam as needed.  Follow-up with me again in 6 to 8 weeks

## 2020-12-11 NOTE — Patient Instructions (Signed)
Good to see you Happy Holidays See me in 6-8 weeks 

## 2020-12-22 ENCOUNTER — Other Ambulatory Visit: Payer: Self-pay | Admitting: Family Medicine

## 2020-12-22 DIAGNOSIS — Z1231 Encounter for screening mammogram for malignant neoplasm of breast: Secondary | ICD-10-CM

## 2021-01-19 DIAGNOSIS — H5310 Unspecified subjective visual disturbances: Secondary | ICD-10-CM | POA: Diagnosis not present

## 2021-01-20 ENCOUNTER — Other Ambulatory Visit: Payer: Self-pay | Admitting: Family Medicine

## 2021-01-20 MED ORDER — GABAPENTIN 100 MG PO CAPS: 200.0000 mg | ORAL_CAPSULE | Freq: Every day | ORAL | 3 refills | Status: DC

## 2021-01-20 NOTE — Telephone Encounter (Signed)
OK to fill daily instead of PRN? Please advise

## 2021-01-29 ENCOUNTER — Ambulatory Visit: Payer: Medicare Other | Admitting: Family Medicine

## 2021-01-30 ENCOUNTER — Other Ambulatory Visit: Payer: Self-pay | Admitting: Family Medicine

## 2021-01-30 NOTE — Progress Notes (Signed)
[name] [place] Phone: [phone] Subjective:   Allison [name], am serving as a scribe for Dr. Hulan Saas.  I'm seeing this patient by the request  of:  [name], MD  CC: Low back pain follow-up  [id number]  Allison [name] is a 70 y.o. female coming in with complaint of back and neck pain. OMT 12/11/2020. Patient states that her chondritis is acting up again.  Patient states some mild tightness noted across the chest but very minimal.  Seems to be after swimming only.  Patient states that sometimes at night can have some discomfort but does not know if it is secondary to the medicine or the this pain.  Patient states the lower back's been doing relatively well.  No true radicular symptoms.  More tightness though.  Been swimming more frequently.  Medications patient has been prescribed: Gabapentin  Taking: Intermittently      Patient did have x-rays taken in September 2020. 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   Reviewed prior external information including notes and imaging from previsou exam, outside providers and external EMR if available.   As well as notes that were available from care everywhere and other healthcare systems.  Past medical history, social, surgical and family history all reviewed in electronic medical record.  No pertanent information unless stated regarding to the chief complaint.   Past Medical History:  Diagnosis Date  . Cancer ([place])   . Chicken pox   . Depression   . Diverticulitis   . Lumbar herniated disc   . 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 DEXA 03/2020 Osteoporosis T = -2.5 at L1-2, lowest, femurs  osteopenia, on fosamax. Continue fosamax.   . Personal history of chemotherapy   . Personal history of radiation therapy   . PONV (postoperative nausea and vomiting)   . UTI (urinary tract infection)     Allergies  Allergen Reactions  . Penicillins     Childhood allergy  . Sulfa Antibiotics Hives and Swelling     Review of Systems:  No headache, visual changes, nausea, vomiting, diarrhea, constipation, dizziness, abdominal pain, skin rash, fevers, chills, night sweats, weight loss, swollen lymph nodes,  joint swelling, chest pain, shortness of breath, mood changes. POSITIVE muscle aches, body aches  Objective  Blood pressure 100/60, pulse 74, height 5\' 4"  (1.626 m), weight 120 lb (54.4 kg), SpO2 97 %.   General: No apparent distress alert and oriented x3 mood and affect normal, dressed appropriately.  HEENT: Pupils equal, extraocular movements intact  Respiratory: Patient's speak in full sentences and does not appear short of breath  Cardiovascular: No lower extremity edema, non tender, no erythema  Gait normal with good balance and coordination.  MSK:  Non tender with full range of motion and good stability and symmetric strength and tone of shoulders, elbows, wrist, hip, knee and ankles bilaterally.  Back -low back exam does have some some mild loss of lordosis. Tightness noted more in the thoracolumbar juncture right greater than left. Tightness mild with Corky Sox. Negative straight leg test. 5 out of 5 strength of the lower extremities  Osteopathic findings   T9 extended rotated  and side bent left with inhaled rib T11 extended rotated and side bent right L2 flexed rotated and side bent right Sacrum right on right       Assessment and Plan:  Lumbar herniated disc Patient has been doing very well.  No radicular symptoms.  Patient does have a history of breast cancer as well as the osteoporosis.  He did more muscle energy.  Patient has done very well.  Encourage patient to  avoid certain high impact exercises.  Has been stretching and swimming.  We discussed changing some of the stretches to after working out.  Follow-up with me again 2 months    Nonallopathic problems  Decision today to treat with OMT was based on Physical Exam  After verbal consent patient was treated with  ME, FPR techniques in   thoracic, lumbar, and sacral  areas  Patient tolerated the procedure well with improvement in symptoms  Patient given exercises, stretches and lifestyle modifications  See medications in patient instructions if given  Patient will follow up in 4-8 weeks      The above documentation has been reviewed and is accurate and complete Lyndal Pulley, DO       Note: This dictation was prepared with Dragon dictation along with smaller phrase technology. Any transcriptional errors that result from this process are unintentional.

## 2021-02-02 ENCOUNTER — Ambulatory Visit
Admission: RE | Admit: 2021-02-02 | Discharge: 2021-02-02 | Disposition: A | Discharge status: Home / Self Care | Payer: Medicare Other | Source: Ambulatory Visit | Attending: Family Medicine | Admitting: Family Medicine

## 2021-02-02 ENCOUNTER — Encounter: Payer: Self-pay | Admitting: Family Medicine

## 2021-02-02 ENCOUNTER — Ambulatory Visit (INDEPENDENT_AMBULATORY_CARE_PROVIDER_SITE_OTHER): Payer: Medicare Other | Admitting: Family Medicine

## 2021-02-02 ENCOUNTER — Other Ambulatory Visit: Payer: Self-pay

## 2021-02-02 DIAGNOSIS — M999 Biomechanical lesion, unspecified: Secondary | ICD-10-CM | POA: Diagnosis not present

## 2021-02-02 DIAGNOSIS — Z1231 Encounter for screening mammogram for malignant neoplasm of breast: Secondary | ICD-10-CM

## 2021-02-02 DIAGNOSIS — M5126 Other intervertebral disc displacement, lumbar region: Secondary | ICD-10-CM | POA: Diagnosis not present

## 2021-02-02 NOTE — Assessment & Plan Note (Signed)
Patient has been doing very well.  No radicular symptoms.  Patient does have a history of breast cancer as well as the osteoporosis.  He did more muscle energy.  Patient has done very well.  Encourage patient to avoid certain high impact exercises.  Has been stretching and swimming.  We discussed changing some of the stretches to after working out.  Follow-up with me again 2 months

## 2021-02-02 NOTE — Patient Instructions (Addendum)
Good to see you  Doing great overall  Change stretches to after swimming  Yoga before swimming  Prilosec daily for two weeks OTC See me again in 2 months

## 2021-02-13 ENCOUNTER — Other Ambulatory Visit: Payer: Self-pay

## 2021-02-13 DIAGNOSIS — M810 Age-related osteoporosis without current pathological fracture: Secondary | ICD-10-CM

## 2021-02-13 MED ORDER — ALENDRONATE SODIUM 70 MG PO TABS: ORAL_TABLET | ORAL | 3 refills | Status: DC

## 2021-02-16 DIAGNOSIS — Z08 Encounter for follow-up examination after completed treatment for malignant neoplasm: Secondary | ICD-10-CM | POA: Diagnosis not present

## 2021-02-16 DIAGNOSIS — Z853 Personal history of malignant neoplasm of breast: Secondary | ICD-10-CM | POA: Diagnosis not present

## 2021-02-27 ENCOUNTER — Other Ambulatory Visit: Payer: Self-pay

## 2021-02-27 DIAGNOSIS — F341 Dysthymic disorder: Secondary | ICD-10-CM

## 2021-02-27 MED ORDER — SERTRALINE HCL 25 MG PO TABS: 25.0000 mg | ORAL_TABLET | Freq: Every day | ORAL | 3 refills | Status: DC

## 2021-03-03 ENCOUNTER — Encounter: Payer: Self-pay | Admitting: Family Medicine

## 2021-04-02 ENCOUNTER — Ambulatory Visit: Payer: Medicare Other | Admitting: Family Medicine

## 2021-04-07 NOTE — Progress Notes (Signed)
[facility] [address] [place] Phone: [phone] Subjective:   I [name] am serving as a Education administrator for Dr. Hulan Saas.  This visit occurred during the SARS-CoV-2 public health emergency.  Safety protocols were in place, including screening questions prior to the visit, additional usage of staff PPE, and extensive cleaning of exam room while observing appropriate contact time as indicated for disinfecting solutions.   I'm seeing this patient by the request  of:  [name], MD  CC: Low back pain follow-up  [facility]:[id number]  Sarya Linenberger is a 70 y.o. female coming in with complaint of back and neck pain. OMT 02/02/2021. Patient states she is doing ok today. Has been having some discomfort lately. States her hips were really sore yesterday. Left groin pain but believes it may be hip flexor pain. Has been doing exercises and hip flexor stretches and exercises. None of the discomfort is associated with activity. Has been taking tylenol arthritis at night before bed.   Medications patient has been prescribed: Gabapentin  Taking: Intermittently         Reviewed prior external information including notes and imaging from previsou exam, outside providers and external EMR if available.   As well as notes that were available from care everywhere and other healthcare systems.  Past medical history, social, surgical and family history all reviewed in electronic medical record.  No pertanent information unless stated regarding to the chief complaint.   Past Medical History:  Diagnosis Date  . Cancer ([place])   . Chicken pox   . Depression   . Diverticulitis   . Lumbar herniated disc   . 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 DEXA 03/2020 Osteoporosis T = -2.5 at L1-2, lowest, femurs osteopenia, on  fosamax. Continue fosamax.   . Personal history of chemotherapy   . Personal history of radiation therapy   . PONV (postoperative nausea and vomiting)   . UTI (urinary tract infection)     Allergies  Allergen Reactions  . Penicillins     Childhood allergy  . Sulfa Antibiotics Hives and Swelling     Review of Systems:  No headache, visual changes, nausea, vomiting, diarrhea, constipation, dizziness, abdominal pain, skin rash, fevers, chills, night sweats, weight loss, swollen lymph nodes,  joint swelling, chest pain, shortness of breath, mood changes. POSITIVE muscle aches, body aches  Objective  Blood pressure 110/70, pulse 62, height 5\' 4"  (1.626 m), weight 121 lb (54.9 kg), SpO2 98 %.   General: No apparent distress alert and oriented x3 mood and affect normal, dressed appropriately.  HEENT: Pupils equal, extraocular movements intact  Respiratory: Patient's speak in full sentences and does not appear short of breath  Cardiovascular: No lower extremity edema, non tender, no erythema  Gait normal with good balance and coordination.  Back -low back exam does have some mild loss of lordosis.  Some tenderness to palpation of the paraspinal musculature with increased tightness still noted at the thoracolumbar juncture seems to be right greater than left still some mild limited range of motion of the hips bilaterally but worse on the left.  Tightness of the hip flexors noted  Osteopathic findings  T6 extended rotated and side bent left with inhaled rib L1 flexed rotated and side bent right Sacrum right on right  Assessment and Plan:  Osteoporosis of lumbar spine Known osteoporotic changes.  On Fosamax.  Doing very well with more muscle energy.  Patient has responded well.  We will get repeat x-rays with patient having a history of breast cancer and mild tightness in the hips.  We will further evaluate the amount of arthritic changes in the hips.  Discussed different ergonomic  changes that she can make with driving and sitting.  No significant change in the medications.  Follow-up with me again in 6 to 8 weeks.  Chronic problem with mild exacerbation.    Nonallopathic problems  Decision today to treat with OMT was based on Physical Exam  After verbal consent patient was treated with HVLA, ME, FPR techniques in  thoracic, lumbar, and sacral  areas  Patient tolerated the procedure well with improvement in symptoms  Patient given exercises, stretches and lifestyle modifications  See medications in patient instructions if given  Patient will follow up in 4-8 weeks      The above documentation has been reviewed and is accurate and complete Lyndal Pulley, DO       Note: This dictation was prepared with Dragon dictation along with smaller phrase technology. Any transcriptional errors that result from this process are unintentional.

## 2021-04-08 ENCOUNTER — Encounter: Payer: Self-pay | Admitting: Family Medicine

## 2021-04-08 ENCOUNTER — Other Ambulatory Visit: Payer: Self-pay

## 2021-04-08 ENCOUNTER — Ambulatory Visit (INDEPENDENT_AMBULATORY_CARE_PROVIDER_SITE_OTHER): Payer: Medicare Other

## 2021-04-08 ENCOUNTER — Ambulatory Visit (INDEPENDENT_AMBULATORY_CARE_PROVIDER_SITE_OTHER): Payer: Medicare Other | Admitting: Family Medicine

## 2021-04-08 VITALS — BP 110/70 | HR 62 | Ht 64.0 in | Wt 121.0 lb

## 2021-04-08 DIAGNOSIS — M9902 Segmental and somatic dysfunction of thoracic region: Secondary | ICD-10-CM

## 2021-04-08 DIAGNOSIS — M25559 Pain in unspecified hip: Secondary | ICD-10-CM

## 2021-04-08 DIAGNOSIS — M810 Age-related osteoporosis without current pathological fracture: Secondary | ICD-10-CM | POA: Diagnosis not present

## 2021-04-08 DIAGNOSIS — M9903 Segmental and somatic dysfunction of lumbar region: Secondary | ICD-10-CM

## 2021-04-08 DIAGNOSIS — M9904 Segmental and somatic dysfunction of sacral region: Secondary | ICD-10-CM | POA: Diagnosis not present

## 2021-04-08 DIAGNOSIS — M47816 Spondylosis without myelopathy or radiculopathy, lumbar region: Secondary | ICD-10-CM | POA: Diagnosis not present

## 2021-04-08 DIAGNOSIS — M1610 Unilateral primary osteoarthritis, unspecified hip: Secondary | ICD-10-CM | POA: Diagnosis not present

## 2021-04-08 NOTE — Assessment & Plan Note (Signed)
Known osteoporotic changes.  On Fosamax.  Doing very well with more muscle energy.  Patient has responded well.  We will get repeat x-rays with patient having a history of breast cancer and mild tightness in the hips.  We will further evaluate the amount of arthritic changes in the hips.  Discussed different ergonomic changes that she can make with driving and sitting.  No significant change in the medications.  Follow-up with me again in 6 to 8 weeks.  Chronic problem with mild exacerbation.

## 2021-04-08 NOTE — Patient Instructions (Addendum)
Good to see you xrays on the way out Try a towel beneath the knees with sitting long term See me again in 6-8 weeks

## 2021-04-18 DIAGNOSIS — Z23 Encounter for immunization: Secondary | ICD-10-CM | POA: Diagnosis not present

## 2021-04-28 ENCOUNTER — Telehealth: Payer: Self-pay | Admitting: Family Medicine

## 2021-04-28 NOTE — Telephone Encounter (Signed)
Left message for patient to call back and schedule Medicare Annual Wellness Visit (AWV) in office.   If not able to come in office, please offer to do virtually or by telephone.   Last AWV:04/15/20  Please schedule at anytime with Nurse Health Advisor.

## 2021-05-04 ENCOUNTER — Ambulatory Visit (INDEPENDENT_AMBULATORY_CARE_PROVIDER_SITE_OTHER): Payer: Medicare Other

## 2021-05-04 ENCOUNTER — Other Ambulatory Visit: Payer: Self-pay

## 2021-05-04 ENCOUNTER — Other Ambulatory Visit: Payer: Self-pay | Admitting: Family Medicine

## 2021-05-04 VITALS — BP 110/62 | HR 74 | Temp 97.3°F | Wt 119.0 lb

## 2021-05-04 DIAGNOSIS — Z0000 Encounter for general adult medical examination without abnormal findings: Secondary | ICD-10-CM

## 2021-05-04 MED ORDER — GABAPENTIN 100 MG PO CAPS: ORAL_CAPSULE | ORAL | 3 refills | Status: DC

## 2021-05-04 NOTE — Progress Notes (Signed)
Subjective:   Allison [name] is a 70 y.o. female who presents for Medicare Annual (Subsequent) preventive examination.  Review of Systems     Cardiac Risk Factors include: advanced age (>41men, >42 women)     Objective:    Today's Vitals   05/04/21 1018  BP: 110/62  Pulse: 74  Temp: (!) 97.3 F (36.3 C)  SpO2: 98%  Weight: 119 lb (54 kg)   Body mass index is 20.43 kg/m.  Advanced Directives 05/04/2021 05/06/2020 05/01/2020 04/15/2020  Does Patient Have a Medical Advance Directive? Yes Yes Yes Yes  Type of Paramedic of [place];Living will - Living will;Healthcare Power of Attorney  Does patient want to make changes to medical advance directive? - No - Patient declined No - Patient declined No - Patient declined  Copy of [place] in Chart? No - copy requested No - copy requested No - copy requested No - copy requested    Current Medications (verified) Outpatient Encounter Medications as of 05/04/2021  Medication Sig  . alendronate (FOSAMAX) 70 MG tablet TAKE 1 TABLET BY MOUTH ONCE WEEKLY ON EMPTY STOMACH WITH A FULL GLASS OF WATER  . Biotin 1 MG CAPS Take by mouth.  . Calcium Citrate-Vitamin D (CALCIUM + D PO) Take by mouth.  . diclofenac sodium (VOLTAREN) 1 % GEL Apply 2 grams topically to affected area qid  . MAGNESIUM PO Take by mouth.  . meloxicam (MOBIC) 15 MG tablet Take 1 tablet (15 mg total) by mouth as needed.  Marland Kitchen NIACIN PO Take by mouth.  . NON FORMULARY Tart cherry extract  . sertraline (ZOLOFT) 25 MG tablet Take 1 tablet (25 mg total) by mouth daily.  . Turmeric 500 MG TABS Take by mouth.  . [DISCONTINUED] gabapentin (NEURONTIN) 100 MG capsule TAKE 2 CAPSULES BY MOUTH NIGHTLY AT BEDTIME  . [DISCONTINUED] diclofenac (VOLTAREN) 75 MG EC tablet Take 1 tablet (75 mg total) by mouth 2 (two) times daily.   No facility-administered encounter medications on file as of 05/04/2021.    Allergies  (verified) Penicillins and Sulfa antibiotics   History: Past Medical History:  Diagnosis Date  . Cancer ([place])   . Chicken pox   . Depression   . Diverticulitis   . Lumbar herniated disc   . 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 DEXA 03/2020 Osteoporosis T = -2.5 at L1-2, lowest, femurs osteopenia, on fosamax. Continue fosamax.   . Personal history of chemotherapy   . Personal history of radiation therapy   . PONV (postoperative nausea and vomiting)   . UTI (urinary tract infection)    Past Surgical History:  Procedure Laterality Date  . ABDOMINAL HYSTERECTOMY    . BREAST BIOPSY Left 1999  . BREAST LUMPECTOMY Left 2000  . OPEN REDUCTION INTERNAL FIXATION (ORIF) METACARPAL Left 05/06/2020   Procedure: OPEN REDUCTION INTERNAL FIXATION (ORIF) LEFT THUMB METACARPAL FRACTURE;  Surgeon: [name], MD;  Location: [facility];  Service: Orthopedics;  Laterality: Left;  block in preop  . TONSILECTOMY/ADENOIDECTOMY WITH MYRINGOTOMY    . TONSILLECTOMY     Family History  Problem Relation Age of Onset  . Alcohol abuse Mother   . Cancer Mother   . Alcohol abuse Father   . Depression Father   . Early death Father   .  Alcohol abuse Brother   . Cancer Brother   . Breast cancer Neg Hx    Social History   Socioeconomic History  . Marital status: Married    Spouse name: Not on file  . Number of children: 0  . Years of education: Not on file  . Highest education level: Not on file  Occupational History  . 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 Smoking status: Former Smoker    Quit date: 12/27/1972    Years since quitting: 48.3  . Smokeless tobacco: Never Used  Vaping Use  . Vaping Use: Never used  Substance and Sexual Activity  . Alcohol use: Yes    Comment: socially  . Drug use: Never   . Sexual activity: Not on file  Other Topics Concern  . Not on file  Social History Narrative   Pets:  2 cats adopted at the start of Covid    Social Determinants of Health   Financial Resource Strain: Low Risk   . Difficulty of Paying Living Expenses: Not hard at all  Food Insecurity: No Food Insecurity  . Worried About Programme researcher, broadcasting/film/videounning Out of Food in the Last Year: Never true  . Ran Out of Food in the Last Year: Never true  Transportation Needs: No Transportation Needs  . Lack of Transportation (Medical): No  . Lack of Transportation (Non-Medical): No  Physical Activity: Sufficiently Active  . Days of Exercise per Week: 6 days  . Minutes of Exercise per Session: 90 min  Stress: No Stress Concern Present  . Feeling of Stress : Not at all  Social Connections: Moderately Isolated  . Frequency of Communication with Friends and Family: Once a week  . Frequency of Social Gatherings with Friends and Family: Three times a week  . Attends Religious Services: Never  . Active Member of Clubs or Organizations: No  . Attends BankerClub or Organization Meetings: Never  . Marital Status: Married    Tobacco Counseling Counseling given: Not Answered   Clinical Intake:  Pre-visit preparation completed: Yes  Pain : No/denies pain     BMI - recorded: 20.43 Nutritional Status: BMI of 19-24  Normal Nutritional Risks: None Diabetes: No  How often do you need to have someone help you when you read instructions, pamphlets, or other written materials from your doctor or pharmacy?: 1 - Never  Diabetic?No  Interpreter Needed?: No  Information entered by :: Lanier Ensignina Prentiss Hammett, LPN   Activities of Daily Living In your present state of health, do you have any difficulty performing the following activities: 05/04/2021 05/06/2020  Hearing? Y N  Comment wears hearing aids -  Vision? N N  Difficulty concentrating or making decisions? N N  Walking or climbing stairs? N N  Dressing or bathing? N N  Doing  errands, shopping? N -  Preparing Food and eating ? N -  Using the Toilet? N -  In the past six months, have you accidently leaked urine? N -  Do you have problems with loss of bowel control? N -  Managing your Medications? N -  Managing your Finances? N -  Housekeeping or managing your Housekeeping? N -  Some recent data might be hidden    Patient Care Team: Willow OraAndy, Camille L, MD as PCP - General (Family Medicine) [place], Elvin Sohristina L, MD as Referring Physician (Dermatology) Sinda DuBowen, Bradley, MD as Consulting Physician (Ophthalmology) Dr. Oneida Arenaswight Harding, DDS as Consulting Physician (Dentistry) Royden PurlFrymark, Shannon, AUD as  Consulting Physician (Audiology) Lyndal Pulley, DO as Consulting Physician (Sports Medicine)  Indicate any recent Medical Services you may have received from other than [facility] providers in the past year (date may be approximate).     Assessment:   This is a routine wellness examination for Allison [name].  Hearing/Vision screen  Hearing Screening   125Hz  250Hz  500Hz  1000Hz  2000Hz  3000Hz  4000Hz  6000Hz  8000Hz   Right ear:           Left ear:           Comments: Pt wears hearing aids   Vision Screening Comments: Pt follows up with Dr Valetta Close for annual eye exams   Dietary issues and exercise activities discussed: Current Exercise Habits: Home exercise routine, Type of exercise: Other - see comments;yoga (cardio , swim bike and play pickle ball), Time (Minutes): > 60, Frequency (Times/Week): 6, Weekly Exercise (Minutes/Week): 0  Goals Addressed            This Visit's Progress   . Patient Stated       Stay healthy       Depression Screen PHQ 2/9 Scores 05/04/2021 09/04/2020 04/15/2020 12/19/2019 09/04/2019 09/04/2019 03/23/2019  PHQ - 2 Score 0 0 0 0 0 0 0  PHQ- 9 Score - 0 - - 0 0 0    Fall Risk Fall Risk  05/04/2021 09/04/2020 04/15/2020 04/01/2020 11/02/2019  Falls in the past year? 0 1 0 0 0  Number falls in past yr: 0 0 0 0 0  Injury with Fall? 0 1 0 0 0  Risk for fall due  to : Impaired vision - - - -  Follow up Falls prevention discussed - Falls evaluation completed;Education provided;Falls prevention discussed - -    FALL RISK PREVENTION PERTAINING TO THE HOME:  Any stairs in or around the home? Yes  If so, are there any without handrails? No  Home free of loose throw rugs in walkways, pet beds, electrical cords, etc? Yes  Adequate lighting in your home to reduce risk of falls? Yes   ASSISTIVE DEVICES UTILIZED TO PREVENT FALLS:  Life alert? No  Use of a cane, walker or w/c? No  Grab bars in the bathroom? No  Shower chair or bench in shower? Yes  Elevated toilet seat or a handicapped toilet? Yes   TIMED UP AND GO:  Was the test performed? Yes .  Length of time to ambulate 10 feet: 10 sec.   Gait steady and fast without use of assistive device  Cognitive Function:     6CIT Screen 05/04/2021 04/15/2020  What Year? 0 points 0 points  What month? 0 points 0 points  What time? - 0 points  Count back from 20 0 points 0 points  Months in reverse 0 points 0 points  Repeat phrase 0 points 0 points  Total Score - 0    Immunizations Immunization History  Administered Date(s) Administered  . Fluad Quad(high Dose 65+) 09/04/2019, 09/04/2020  . Influenza-Unspecified 02/07/2015, 09/02/2018  . PFIZER(Purple Top)SARS-COV-2 Vaccination 01/15/2020, 02/05/2020, 09/30/2020  . Pneumococcal-Unspecified 03/15/2018  . Tdap 02/11/2016  . Zoster Recombinat (Shingrix) 09/07/2011, 01/06/2019, 05/08/2019    TDAP status: Up to date  Flu Vaccine status: Up to date  Pneumococcal vaccine status: Up to date  Covid-19 vaccine status: Completed vaccines  Qualifies for Shingles Vaccine? Yes   Zostavax completed Yes   Shingrix Completed?: Yes  Screening Tests Health Maintenance  Topic Date Due  . COVID-19 Vaccine (4 - Booster for [employer] series)  03/31/2021  . INFLUENZA VACCINE  07/27/2021  . MAMMOGRAM  02/02/2022  . DEXA SCAN  04/15/2022  . COLONOSCOPY (Pts  45-30yrs Insurance coverage will need to be confirmed)  12/07/2025  . TETANUS/TDAP  02/10/2026  . Hepatitis C Screening  Completed  . PNA vac Low Risk Adult  Completed  . HPV VACCINES  Aged Out    Health Maintenance  Health Maintenance Due  Topic Date Due  . COVID-19 Vaccine (4 - Booster for [employer] series) 03/31/2021    Colorectal cancer screening: Type of screening: Colonoscopy. Completed 12/08/15. Repeat every 10 years  Mammogram status: Completed 02/02/21. Repeat every year  Bone Density status: Completed 04/15/20. Results reflect: Bone density results: OSTEOPOROSIS. Repeat every 2 years.   Additional Screening:  Hepatitis C Screening:  Completed 09/04/19  Vision Screening: Recommended annual ophthalmology exams for early detection of glaucoma and other disorders of the eye. Is the patient up to date with their annual eye exam?  Yes  Who is the provider or what is the name of the office in which the patient attends annual eye exams? Dr Valetta Close  If pt is not established with a provider, would they like to be referred to a provider to establish care? No .   Dental Screening: Recommended annual dental exams for proper oral hygiene  Community Resource Referral / Chronic Care Management: CRR required this visit?  No   CCM required this visit?  No      Plan:     I have personally reviewed and noted the following in the patient's chart:   . Medical and social history . Use of alcohol, tobacco or illicit drugs  . Current medications and supplements including opioid prescriptions.  . Functional ability and status . Nutritional status . Physical activity . Advanced directives . List of other physicians . Hospitalizations, surgeries, and ER visits in previous 12 months . Vitals . Screenings to include cognitive, depression, and falls . Referrals and appointments  In addition, I have reviewed and discussed with patient certain preventive protocols, quality metrics, and best  practice recommendations. A written personalized care plan for preventive services as well as general preventive health recommendations were provided to patient.     Willette Brace, LPN   06/01/4402   Nurse Notes: None

## 2021-05-04 NOTE — Patient Instructions (Addendum)
Allison [name] , Thank you for taking time to come for your Medicare Wellness Visit. I appreciate your ongoing commitment to your health goals. Please review the following plan we discussed and let me know if I can assist you in the future.   Screening recommendations/referrals: Colonoscopy: Done 12/08/15 Mammogram: Done 02/02/21 Bone Density: Done 04/15/20 Recommended yearly ophthalmology/optometry visit for glaucoma screening and checkup Recommended yearly dental visit for hygiene and checkup  Vaccinations: Influenza vaccine: Up to date Pneumococcal vaccine: Up to date Tdap vaccine: Up to date Shingles vaccine: Completed 09/07/11, 01/06/19 & 05/08/19   Covid-19:Completed 1/19, 2/9, & 09/30/20  Advanced directives: Advance directive discussed with you today. I have provided a copy for you to complete at home and have notarized. Once this is complete please bring a copy in to our office so we can scan it into your chart.  Conditions/risks identified: Stay healthy   Next appointment: Follow up in one year for your annual wellness visit    Preventive Care 65 Years and Older, Female Preventive care refers to lifestyle choices and visits with your health care provider that can promote health and wellness. What does preventive care include?  A yearly physical exam. This is also called an annual well check.  Dental exams once or twice a year.  Routine eye exams. Ask your health care provider how often you should have your eyes checked.  Personal lifestyle choices, including:  Daily care of your teeth and gums.  Regular physical activity.  Eating a healthy diet.  Avoiding tobacco and drug use.  Limiting alcohol use.  Practicing safe sex.  Taking low-dose aspirin every day.  Taking vitamin and mineral supplements as recommended by your health care provider. What happens during an annual well check? The services and screenings done by your health care provider during your annual  well check will depend on your age, overall health, lifestyle risk factors, and family history of disease. Counseling  Your health care provider may ask you questions about your:  Alcohol use.  Tobacco use.  Drug use.  Emotional well-being.  Home and relationship well-being.  Sexual activity.  Eating habits.  History of falls.  Memory and ability to understand (cognition).  Work and work Statistician.  Reproductive health. Screening  You may have the following tests or measurements:  Height, weight, and BMI.  Blood pressure.  Lipid and cholesterol levels. These may be checked every 5 years, or more frequently if you are over 60 years old.  Skin check.  Lung cancer screening. You may have this screening every year starting at age 24 if you have a 30-pack-year history of smoking and currently smoke or have quit within the past 15 years.  Fecal occult blood test (FOBT) of the stool. You may have this test every year starting at age 55.  Flexible sigmoidoscopy or colonoscopy. You may have a sigmoidoscopy every 5 years or a colonoscopy every 10 years starting at age 76.  Hepatitis C blood test.  Hepatitis B blood test.  Sexually transmitted disease (STD) testing.  Diabetes screening. This is done by checking your blood sugar (glucose) after you have not eaten for a while (fasting). You may have this done every 1-3 years.  Bone density scan. This is done to screen for osteoporosis. You may have this done starting at age 28.  Mammogram. This may be done every 1-2 years. Talk to your health care provider about how often you should have regular mammograms. Talk with your health care provider  about your test results, treatment options, and if necessary, the need for more tests. Vaccines  Your health care provider may recommend certain vaccines, such as:  Influenza vaccine. This is recommended every year.  Tetanus, diphtheria, and acellular pertussis (Tdap, Td) vaccine.  You may need a Td booster every 10 years.  Zoster vaccine. You may need this after age 86.  Pneumococcal 13-valent conjugate (PCV13) vaccine. One dose is recommended after age 42.  Pneumococcal polysaccharide (PPSV23) vaccine. One dose is recommended after age 82. Talk to your health care provider about which screenings and vaccines you need and how often you need them. This information is not intended to replace advice given to you by your health care provider. Make sure you discuss any questions you have with your health care provider. Document Released: 01/09/2016 Document Revised: 09/01/2016 Document Reviewed: 10/14/2015 Elsevier Interactive Patient Education  2017 [place] Prevention in the Home Falls can cause injuries. They can happen to people of all ages. There are many things you can do to make your home safe and to help prevent falls. What can I do on the outside of my home?  Regularly fix the edges of walkways and driveways and fix any cracks.  Remove anything that might make you trip as you walk through a door, such as a raised step or threshold.  Trim any bushes or trees on the path to your home.  Use bright outdoor lighting.  Clear any walking paths of anything that might make someone trip, such as rocks or tools.  Regularly check to see if handrails are loose or broken. Make sure that both sides of any steps have handrails.  Any raised decks and porches should have guardrails on the edges.  Have any leaves, snow, or ice cleared regularly.  Use sand or salt on walking paths during winter.  Clean up any spills in your garage right away. This includes oil or grease spills. What can I do in the bathroom?  Use night lights.  Install grab bars by the toilet and in the tub and shower. Do not use towel bars as grab bars.  Use non-skid mats or decals in the tub or shower.  If you need to sit down in the shower, use a plastic, non-slip stool.  Keep the  floor dry. Clean up any water that spills on the floor as soon as it happens.  Remove soap buildup in the tub or shower regularly.  Attach bath mats securely with double-sided non-slip rug tape.  Do not have throw rugs and other things on the floor that can make you trip. What can I do in the bedroom?  Use night lights.  Make sure that you have a light by your bed that is easy to reach.  Do not use any sheets or blankets that are too big for your bed. They should not hang down onto the floor.  Have a firm chair that has side arms. You can use this for support while you get dressed.  Do not have throw rugs and other things on the floor that can make you trip. What can I do in the kitchen?  Clean up any spills right away.  Avoid walking on wet floors.  Keep items that you use a lot in easy-to-reach places.  If you need to reach something above you, use a strong step stool that has a grab bar.  Keep electrical cords out of the way.  Do not use floor polish or  wax that makes floors slippery. If you must use wax, use non-skid floor wax.  Do not have throw rugs and other things on the floor that can make you trip. What can I do with my stairs?  Do not leave any items on the stairs.  Make sure that there are handrails on both sides of the stairs and use them. Fix handrails that are broken or loose. Make sure that handrails are as long as the stairways.  Check any carpeting to make sure that it is firmly attached to the stairs. Fix any carpet that is loose or worn.  Avoid having throw rugs at the top or bottom of the stairs. If you do have throw rugs, attach them to the floor with carpet tape.  Make sure that you have a light switch at the top of the stairs and the bottom of the stairs. If you do not have them, ask someone to add them for you. What else can I do to help prevent falls?  Wear shoes that:  Do not have high heels.  Have rubber bottoms.  Are comfortable and fit  you well.  Are closed at the toe. Do not wear sandals.  If you use a stepladder:  Make sure that it is fully opened. Do not climb a closed stepladder.  Make sure that both sides of the stepladder are locked into place.  Ask someone to hold it for you, if possible.  Clearly mark and make sure that you can see:  Any grab bars or handrails.  First and last steps.  Where the edge of each step is.  Use tools that help you move around (mobility aids) if they are needed. These include:  Canes.  Walkers.  Scooters.  Crutches.  Turn on the lights when you go into a dark area. Replace any light bulbs as soon as they burn out.  Set up your furniture so you have a clear path. Avoid moving your furniture around.  If any of your floors are uneven, fix them.  If there are any pets around you, be aware of where they are.  Review your medicines with your doctor. Some medicines can make you feel dizzy. This can increase your chance of falling. Ask your doctor what other things that you can do to help prevent falls. This information is not intended to replace advice given to you by your health care provider. Make sure you discuss any questions you have with your health care provider. Document Released: 10/09/2009 Document Revised: 05/20/2016 Document Reviewed: 01/17/2015 Elsevier Interactive Patient Education  2017 [employer].

## 2021-05-05 ENCOUNTER — Encounter: Payer: Self-pay | Admitting: Family Medicine

## 2021-05-21 DIAGNOSIS — H524 Presbyopia: Secondary | ICD-10-CM | POA: Diagnosis not present

## 2021-05-21 DIAGNOSIS — H25013 Cortical age-related cataract, bilateral: Secondary | ICD-10-CM | POA: Diagnosis not present

## 2021-05-22 NOTE — Progress Notes (Signed)
[place] [facility] [place] Phone: [phone] Subjective:   [name], am serving as a scribe for Dr. Hulan Saas. This visit occurred during the SARS-CoV-2 public health emergency.  Safety protocols were in place, including screening questions prior to the visit, additional usage of staff PPE, and extensive cleaning of exam room while observing appropriate contact time as indicated for disinfecting solutions.   I'm seeing this patient by the request  of:  [name], MD  CC: Low back and hip pain follow-up  NOI:[id number]  Allison [name] is a 70 y.o. female coming in with complaint of back and neck pain. OMT 04/08/2021. Patient states that she she been staying active.   Medications patient has been prescribed: None      Previous x-rays taken April 2022 showed advanced degenerative disease and facet arthropathy with grade 1 anterior listhesis at L4-L5.  Hip x-rays also independently visualized by me today showing very mild degenerative changes noted bilaterally.  Most recent mammogram in February was remarkable.   Reviewed prior external information including notes and imaging from previsou exam, outside providers and external EMR if available.  This includes the above.  As well as notes that were available from care everywhere and other healthcare systems.  Past medical history, social, surgical and family history all reviewed in electronic medical record.  No pertanent information unless stated regarding to the chief complaint.   Past Medical History:  Diagnosis Date  . Cancer ([place])   . Chicken pox   . Depression   . Diverticulitis   . Lumbar herniated disc   . 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 DEXA 03/2020 Osteoporosis T = -2.5 at L1-2, lowest, femurs osteopenia, on fosamax.  Continue fosamax.   . Personal history of chemotherapy   . Personal history of radiation therapy   . PONV (postoperative nausea and vomiting)   . UTI (urinary tract infection)     Allergies  Allergen Reactions  . Penicillins     Childhood allergy  . Sulfa Antibiotics Hives and Swelling     Review of Systems:  No headache, visual changes, nausea, vomiting, diarrhea, constipation, dizziness, abdominal pain, skin rash, fevers, chills, night sweats, weight loss, swollen lymph nodes, body aches, joint swelling, chest pain, shortness of breath, mood changes. POSITIVE muscle aches  Objective  Blood pressure 108/70, pulse 76, height 5\' 4"  (1.626 m), weight 118 lb (53.5 kg), SpO2 99 %.   General: No apparent distress alert and oriented x3 mood and affect normal, dressed appropriately.  HEENT: Pupils equal, extraocular movements intact  Respiratory: Patient's speak in full sentences and does not appear short of breath  Cardiovascular: No lower extremity edema, non tender, no erythema  Gait normal with good balance and coordination.  MSK:  Non tender with full range of motion and good stability and symmetric strength and tone of shoulders, elbows, wrist, hip, knee and ankles bilaterally.  Back -low back exam does have significant loss of lordosis.  Patient does have tightness with FABER test.  Tightness with extension of the back.  5 out of 5 strength of the lower extremities.  Osteopathic findings  T9 extended rotated and side bent left L2 flexed rotated and side bent right Sacrum right on right       Assessment and Plan:  Lumbar herniated disc Patient has been doing relatively well.  Responding well to osteopathic manipulation at this time.  Patient is only taking meloxicam as needed.  We discussed continuing to monitor with patient's history of osteoporosis as well as breast cancer.  Patient noted is able to continue to stay active.  No new symptoms.  Follow-up again in 6 to 8  weeks    Nonallopathic problems  Decision today to treat with OMT was based on Physical Exam  After verbal consent patient was treated with HVLA, ME, FPR techniques in  thoracic, lumbar, and sacral  areas  Patient tolerated the procedure well with improvement in symptoms  Patient given exercises, stretches and lifestyle modifications  See medications in patient instructions if given  Patient will follow up in 4-8 weeks      The above documentation has been reviewed and is accurate and complete Allison Pulley, DO       Note: This dictation was prepared with Dragon dictation along with smaller phrase technology. Any transcriptional errors that result from this process are unintentional.

## 2021-05-26 ENCOUNTER — Encounter: Payer: Self-pay | Admitting: Family Medicine

## 2021-05-26 ENCOUNTER — Other Ambulatory Visit: Payer: Self-pay

## 2021-05-26 ENCOUNTER — Ambulatory Visit (INDEPENDENT_AMBULATORY_CARE_PROVIDER_SITE_OTHER): Payer: Medicare Other | Admitting: Family Medicine

## 2021-05-26 VITALS — BP 108/70 | HR 76 | Ht 64.0 in | Wt 118.0 lb

## 2021-05-26 DIAGNOSIS — M5126 Other intervertebral disc displacement, lumbar region: Secondary | ICD-10-CM

## 2021-05-26 DIAGNOSIS — M9903 Segmental and somatic dysfunction of lumbar region: Secondary | ICD-10-CM | POA: Diagnosis not present

## 2021-05-26 DIAGNOSIS — M9902 Segmental and somatic dysfunction of thoracic region: Secondary | ICD-10-CM

## 2021-05-26 DIAGNOSIS — M9904 Segmental and somatic dysfunction of sacral region: Secondary | ICD-10-CM | POA: Diagnosis not present

## 2021-05-26 NOTE — Assessment & Plan Note (Signed)
Patient has been doing relatively well.  Responding well to osteopathic manipulation at this time.  Patient is only taking meloxicam as needed.  We discussed continuing to monitor with patient's history of osteoporosis as well as breast cancer.  Patient noted is able to continue to stay active.  No new symptoms.  Follow-up again in 6 to 8 weeks

## 2021-05-26 NOTE — Patient Instructions (Signed)
Enjoy the beach Move exercises to after workout See me in 6-8 weeks

## 2021-07-15 NOTE — Progress Notes (Signed)
[place] [facility] Phone: [phone] Subjective:   Allison [name], am serving as a scribe for Dr. Hulan Saas.  This visit occurred during the SARS-CoV-2 public health emergency.  Safety protocols were in place, including screening questions prior to the visit, additional usage of staff PPE, and extensive cleaning of exam room while observing appropriate contact time as indicated for disinfecting solutions.   I'm seeing this patient by the request  of:  [name], MD  CC: back and neck pain   [id number]  Allison [name] is a 70 y.o. female coming in with complaint of back and neck pain. OMT 05/26/2021. Patient states that she has been doing well since last visit. [place] and she was less active and this causes some tightness.   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 DEXA 03/2020 Osteoporosis T = -2.5 at L1-2, lowest, femurs osteopenia, on fosamax. Continue fosamax.    Personal history of chemotherapy    Personal history of radiation therapy    PONV (postoperative nausea and vomiting)    UTI (urinary tract infection)     Allergies  Allergen Reactions   Penicillins     Childhood allergy   Sulfa Antibiotics Hives and Swelling     Review of Systems:  [name], visual changes, nausea, vomiting, diarrhea, constipation, dizziness, abdominal pain, skin rash, fevers, chills, night sweats, weight loss, swollen lymph nodes, body aches, joint swelling, chest pain, shortness of breath, mood changes. POSITIVE muscle aches  Objective  Blood pressure 116/76, pulse 75,  height 5\' 4"  (1.626 m), weight 118 lb (53.5 kg), SpO2 98 %.   General: [name], dressed appropriately.  HEENT: Pupils equal, extraocular movements intact  Respiratory: Patient's speak in full sentences and does not appear short of breath  Cardiovascular: [name], non tender, [name]  Gait normal with good balance and coordination.  MSK:  Non tender with full range of motion and good stability and symmetric strength and tone of shoulders, elbows, wrist, hip, knee and ankles bilaterally.  Back -low back exam does have tightness more on the right side of the back.  Seems to be more at the thoracolumbar juncture as well as somewhat over the sacroiliac joint.  Osteopathic findings   T11 extended rotated and side bent right L2 flexed rotated and side bent right Sacrum right on right       Assessment and Plan:  Lumbar herniated disc Patient has been doing relatively well.  Intermittently 2 months since last visit.  Patient has not been doing the exercises quite as frequently and has seen some mild tightness.  Nothing about stopping her from activities.  Discussed potential stretching after doing certain activities.  Follow-up with me again in 6 to 8 weeks.   Nonallopathic problems  Decision today to treat with OMT was based on Physical Exam  After verbal consent patient was treated with HVLA, ME, FPR techniques in  thoracic, lumbar, and  sacral  areas  Patient tolerated the procedure well with improvement in symptoms  Patient given exercises, stretches and lifestyle modifications  See medications in patient instructions if given  Patient will follow up in 6-8 weeks      The above documentation has been reviewed and is accurate and complete Allison Pulley, DO        Note: This dictation was prepared with Dragon dictation along with smaller phrase technology. Any transcriptional errors that result  from this process are unintentional.

## 2021-07-20 ENCOUNTER — Other Ambulatory Visit: Payer: Self-pay

## 2021-07-20 ENCOUNTER — Encounter: Payer: Self-pay | Admitting: Family Medicine

## 2021-07-20 ENCOUNTER — Ambulatory Visit (INDEPENDENT_AMBULATORY_CARE_PROVIDER_SITE_OTHER): Payer: Medicare Other | Admitting: Family Medicine

## 2021-07-20 VITALS — BP 116/76 | HR 75 | Ht 64.0 in | Wt 118.0 lb

## 2021-07-20 DIAGNOSIS — M5126 Other intervertebral disc displacement, lumbar region: Secondary | ICD-10-CM | POA: Diagnosis not present

## 2021-07-20 DIAGNOSIS — M9903 Segmental and somatic dysfunction of lumbar region: Secondary | ICD-10-CM | POA: Diagnosis not present

## 2021-07-20 DIAGNOSIS — M9902 Segmental and somatic dysfunction of thoracic region: Secondary | ICD-10-CM

## 2021-07-20 DIAGNOSIS — M9904 Segmental and somatic dysfunction of sacral region: Secondary | ICD-10-CM

## 2021-07-20 NOTE — Assessment & Plan Note (Signed)
Patient has been doing relatively well.  Intermittently 2 months since last visit.  Patient has not been doing the exercises quite as frequently and has seen some mild tightness.  Nothing about stopping her from activities.  Discussed potential stretching after doing certain activities.  Follow-up with me again in 6 to 8 weeks.

## 2021-07-20 NOTE — Patient Instructions (Signed)
Get back into routine  Stretch after pickleball See me in 5-6 weeks

## 2021-08-07 IMAGING — US US BREAST*L* LIMITED INC AXILLA
1 series · 2 of 2 positions shown · non-contrast
Comparison: Previous exam(s).

CLINICAL DATA: 68-year-old female presenting for evaluation of
focal pain in the left breast for about 2 weeks. She has history of
a left breast lumpectomy in 0555. The pain is near her lumpectomy
site.

EXAM:
DIGITAL DIAGNOSTIC UNILATERAL LEFT MAMMOGRAM WITH CAD AND TOMO
LEFT BREAST ULTRASOUND

[Series 1: us breast*left* limited inc axilla · 0.04mm/px · 2 of 2 slices shown]
[im 1/2]
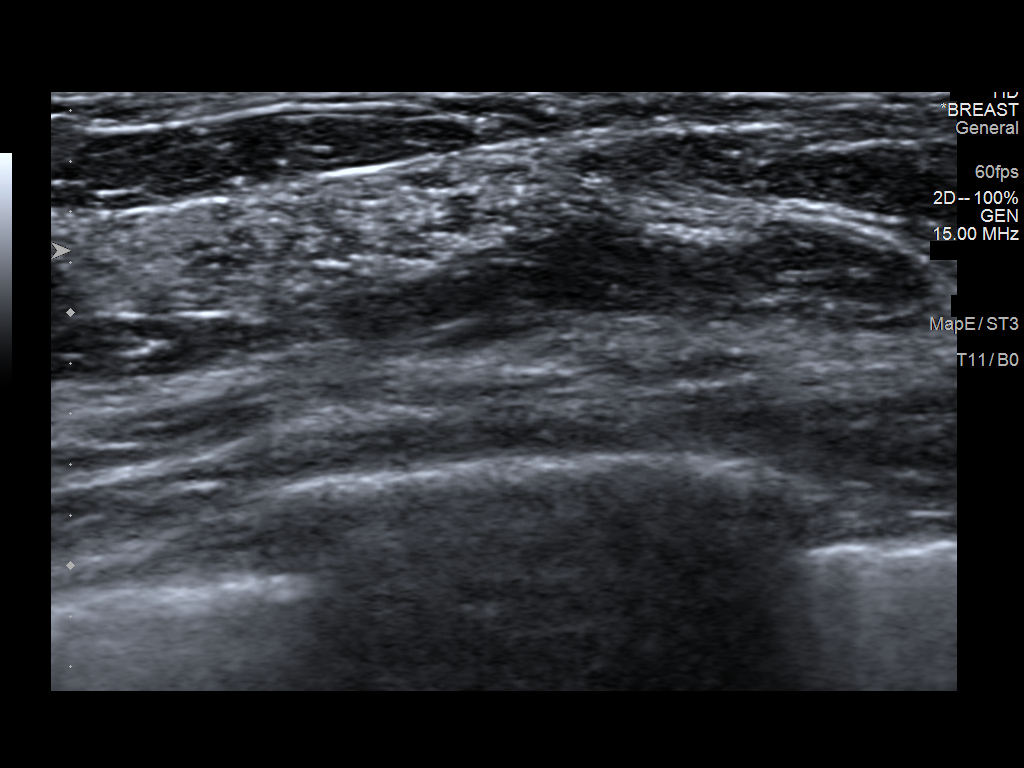
[im 2/2]
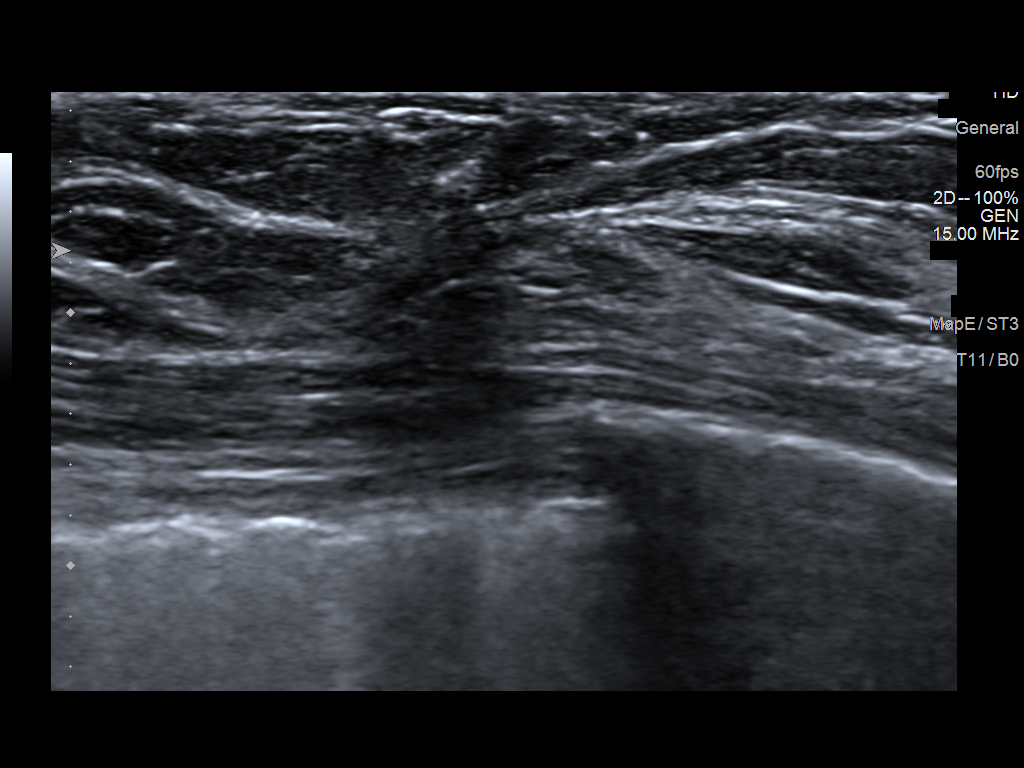

[2 of 2 positions shown; findings below may reference images not displayed]

ACR Breast Density Category c: The breast tissue is heterogeneously
dense, which may obscure small masses.
FINDINGS: A BB has been placed at the site of focal pain in the superior
anterior left breast. No suspicious changes are seen deep to the
skin marker. No suspicious calcifications, masses or areas of
distortion are seen in the left breast.

Mammographic images were processed with CAD.

Physical exam of the superior left breast demonstrates no discrete
palpable masses.

Ultrasound targeted to the superior left breast demonstrates normal
fibroglandular tissue. No suspicious masses or areas of shadowing
are identified.
IMPRESSION: 1. No mammographic or targeted sonographic abnormalities at the site
of focal pain in the superior anterior left breast.

2.  No evidence of left breast malignancy.

RECOMMENDATION:
1. Clinical follow-up recommended for the site of focal pain in the
left breast. Any further workup should be based on clinical grounds.

2. Return to routine screening mammography is recommended. The
patient will be due for screening in Sunday December, 2020.

I have discussed the findings and recommendations with the patient.
If applicable, a reminder letter will be sent to the patient
regarding the next appointment.

BI-RADS CATEGORY  1: Negative.

## 2021-08-07 IMAGING — MG MM DIGITAL DIAGNOSTIC UNILAT*L* W/ TOMO W/ CAD
6 series · 6 of 18 positions shown · non-contrast
Comparison: Previous exam(s).

CLINICAL DATA: 68-year-old female presenting for evaluation of
focal pain in the left breast for about 2 weeks. She has history of
a left breast lumpectomy in 0555. The pain is near her lumpectomy
site.

EXAM:
DIGITAL DIAGNOSTIC UNILATERAL LEFT MAMMOGRAM WITH CAD AND TOMO
LEFT BREAST ULTRASOUND

[L TAN synth-2D]
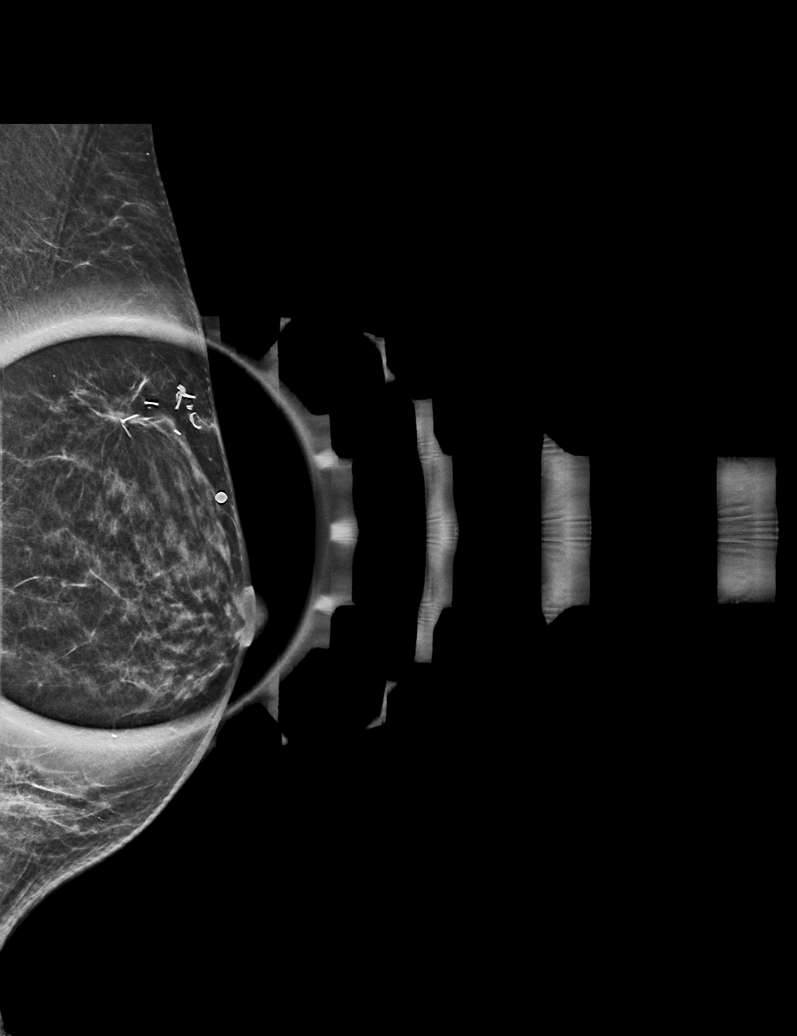

[L MLO synth-2D]
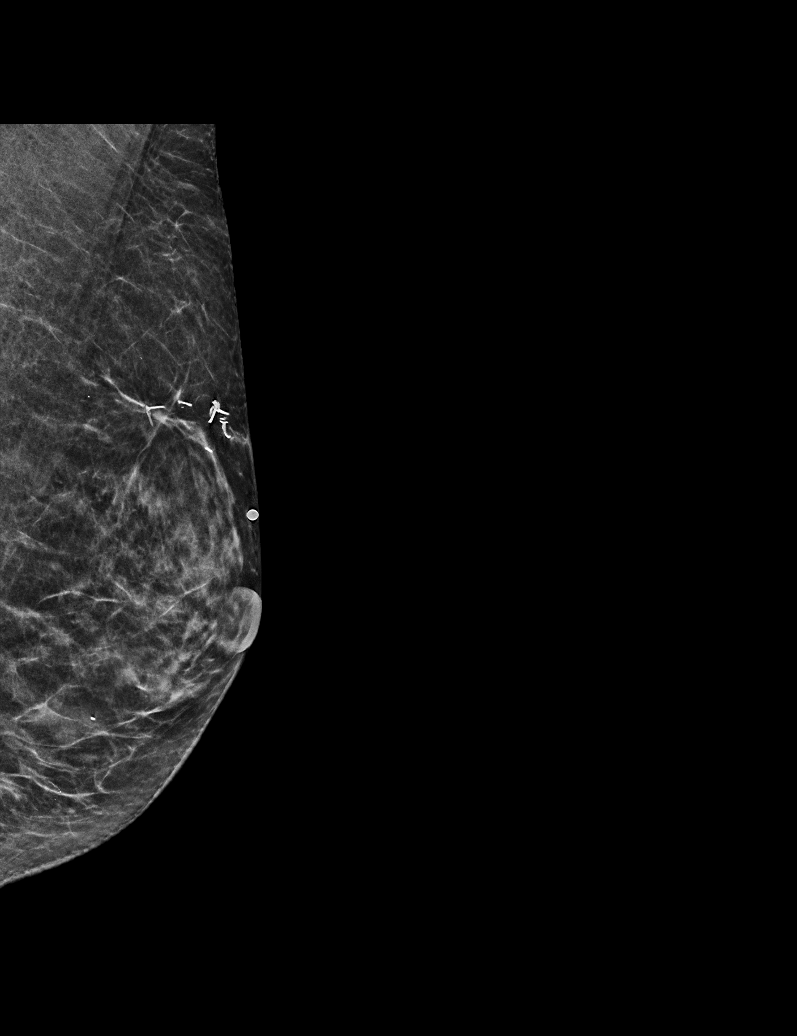

[L CC synth-2D]
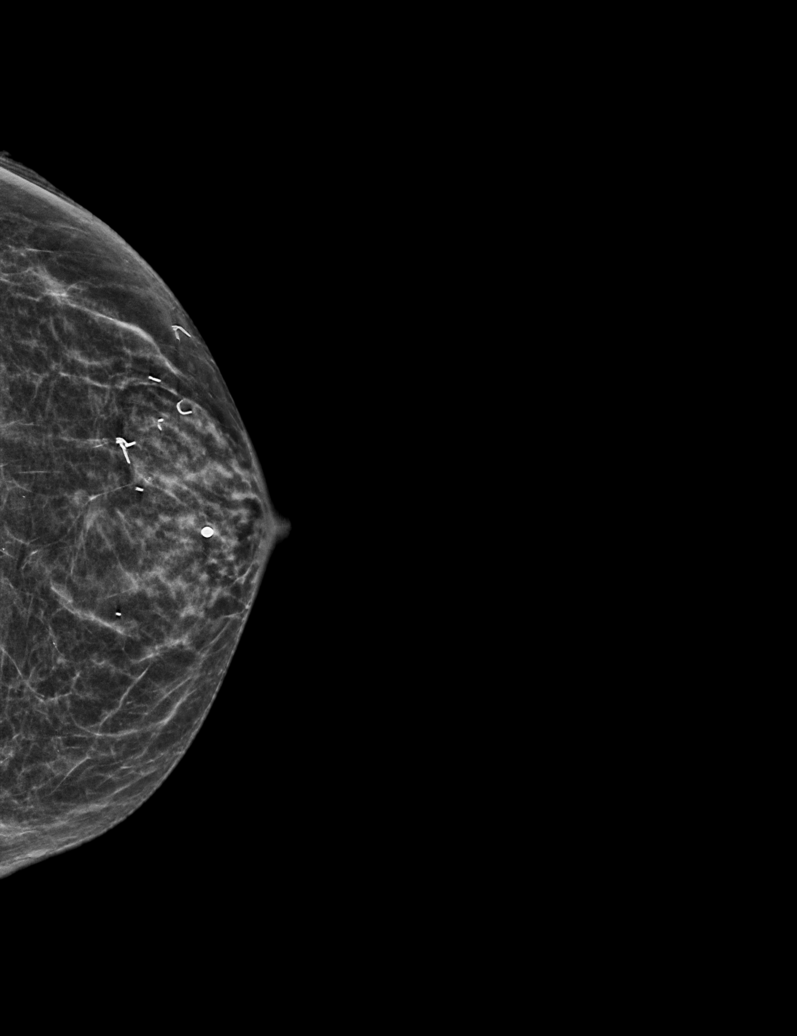

[L MLO tomo · tomo slice 21/41.0]
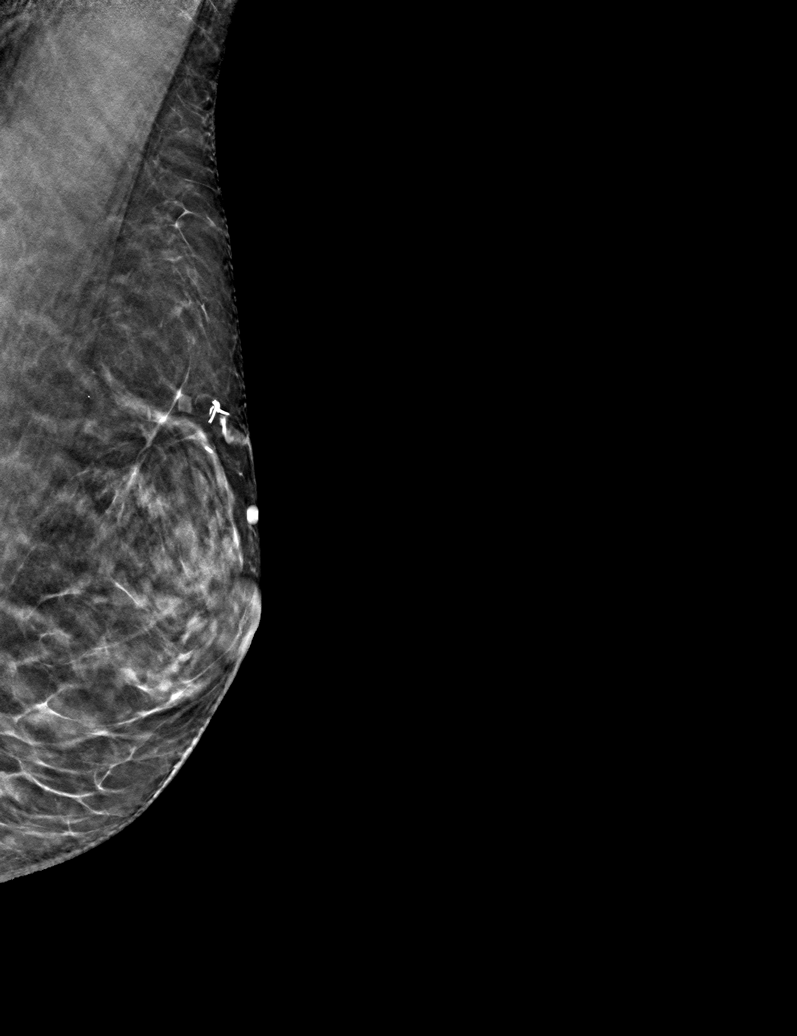

[L CC tomo · tomo slice 22/43.0]
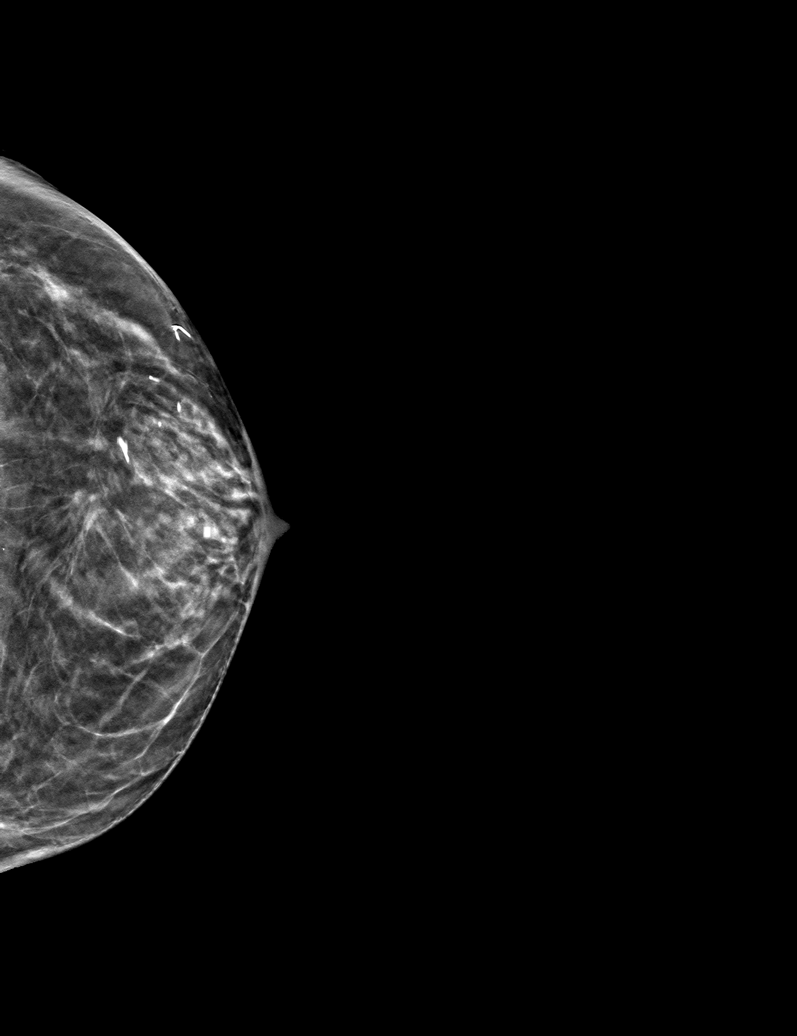

[L TAN tomo · tomo slice 20/39.0]
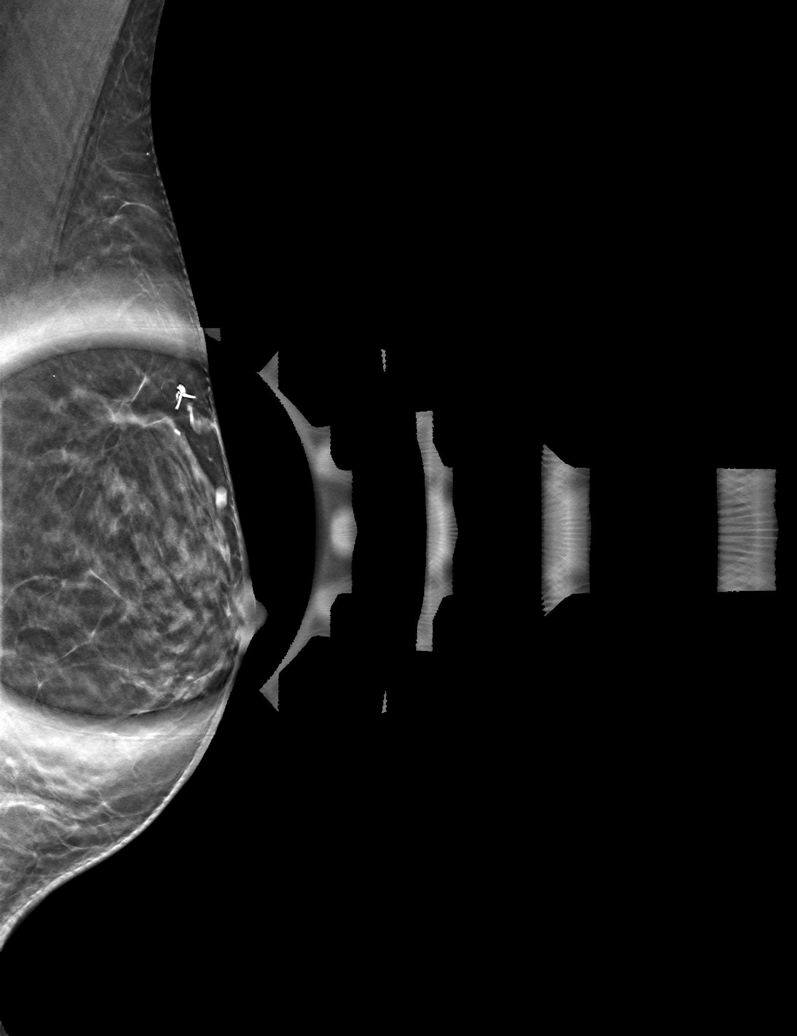

[6 of 18 positions shown; findings below may reference images not displayed]

ACR Breast Density Category c: The breast tissue is heterogeneously
dense, which may obscure small masses.
FINDINGS: A BB has been placed at the site of focal pain in the superior
anterior left breast. No suspicious changes are seen deep to the
skin marker. No suspicious calcifications, masses or areas of
distortion are seen in the left breast.

Mammographic images were processed with CAD.

Physical exam of the superior left breast demonstrates no discrete
palpable masses.

Ultrasound targeted to the superior left breast demonstrates normal
fibroglandular tissue. No suspicious masses or areas of shadowing
are identified.
IMPRESSION: 1. No mammographic or targeted sonographic abnormalities at the site
of focal pain in the superior anterior left breast.

2.  No evidence of left breast malignancy.

RECOMMENDATION:
1. Clinical follow-up recommended for the site of focal pain in the
left breast. Any further workup should be based on clinical grounds.

2. Return to routine screening mammography is recommended. The
patient will be due for screening in Sunday December, 2020.

I have discussed the findings and recommendations with the patient.
If applicable, a reminder letter will be sent to the patient
regarding the next appointment.

BI-RADS CATEGORY  1: Negative.

## 2021-08-26 ENCOUNTER — Ambulatory Visit (INDEPENDENT_AMBULATORY_CARE_PROVIDER_SITE_OTHER): Payer: Medicare Other | Admitting: Family Medicine

## 2021-08-26 ENCOUNTER — Other Ambulatory Visit: Payer: Self-pay

## 2021-08-26 ENCOUNTER — Encounter: Payer: Self-pay | Admitting: Family Medicine

## 2021-08-26 VITALS — BP 100/68 | HR 62 | Ht 64.0 in | Wt 118.0 lb

## 2021-08-26 DIAGNOSIS — M9903 Segmental and somatic dysfunction of lumbar region: Secondary | ICD-10-CM | POA: Diagnosis not present

## 2021-08-26 DIAGNOSIS — M5126 Other intervertebral disc displacement, lumbar region: Secondary | ICD-10-CM | POA: Diagnosis not present

## 2021-08-26 DIAGNOSIS — M9904 Segmental and somatic dysfunction of sacral region: Secondary | ICD-10-CM | POA: Diagnosis not present

## 2021-08-26 DIAGNOSIS — M9902 Segmental and somatic dysfunction of thoracic region: Secondary | ICD-10-CM

## 2021-08-26 NOTE — Assessment & Plan Note (Signed)
Patient is doing very well the manipulation therapy.  Patient has done well with the core strengthening.  Increase activity slowly.  Follow-up again in 6 to 8 weeks.  Patient is going to be doing a bike ride and we will see how patient does  Anticipate her doing well.

## 2021-08-26 NOTE — Progress Notes (Signed)
[facility] [place] [address] [place] Phone: [phone] Subjective:   IVilma Meckel, am serving as a scribe for Dr. Hulan Saas. This visit occurred during the SARS-CoV-2 public health emergency.  Safety protocols were in place, including screening questions prior to the visit, additional usage of staff PPE, and extensive cleaning of exam room while observing appropriate contact time as indicated for disinfecting solutions.   I'm seeing this patient by the request  of:  [name], MD  CC: Low back pain follow-up  [id number]  Tylie Cadwalader is a 70 y.o. female coming in with complaint of back and neck pain. OMT 07/20/2021. Patient states she's doing better. She does have some stiffness in her back.  Nothing too severe.  Patient has been able to do yoga, riding bikes, as well as swimming.  Patient feels like she is doing relatively well just feels like she does need some manipulation.  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 DEXA 03/2020 Osteoporosis T = -2.5 at L1-2, lowest, femurs osteopenia, on fosamax. Continue fosamax.    Personal history of chemotherapy    Personal history of radiation therapy    PONV (postoperative nausea and vomiting)    UTI (urinary tract infection)     Allergies  Allergen Reactions   Penicillins     Childhood allergy   Sulfa Antibiotics Hives and Swelling     Review of Systems:  No headache, visual changes, nausea, vomiting, diarrhea, constipation, dizziness, abdominal pain, skin rash, fevers, chills, night sweats, weight loss, swollen lymph nodes, body aches, joint  swelling, chest pain, shortness of breath, mood changes. POSITIVE muscle aches  Objective  Blood pressure 100/68, pulse 62, height '5\' 4"'$  (1.626 m), weight 118 lb (53.5 kg), SpO2 99 %.   General: No apparent distress alert and oriented x3 mood and affect normal, dressed appropriately.  HEENT: Pupils equal, extraocular movements intact  Respiratory: Patient's speak in full sentences and does not appear short of breath  Cardiovascular: No lower extremity edema, non tender, no erythema  Low back exam does have some mild tightness noted.  Tightness also noted in the thoracic paraspinal musculature.  Patient has no midline tenderness.  Mild tightness noted but especially extension of the back.  Mild tightness of the hamstrings.  Osteopathic findings  T6 extended rotated and side bent left L3 flexed rotated and side bent right Sacrum right on right       Assessment and Plan:  Lumbar herniated disc Patient is doing very well the manipulation therapy.  Patient has done well with the core strengthening.  Increase activity slowly.  Follow-up again in 6 to 8 weeks.  Patient is going to be doing a bike ride and we will see how patient does  Anticipate her doing well.   Nonallopathic problems  Decision today to treat with OMT was based on Physical Exam  After verbal consent patient was treated with HVLA, ME, FPR techniques in  thoracic, lumbar, and sacral  areas  Patient tolerated the procedure well  with improvement in symptoms  Patient given exercises, stretches and lifestyle modifications  See medications in patient instructions if given  Patient will follow up in 4-8 weeks      The above documentation has been reviewed and is accurate and complete Lyndal Pulley, DO        Note: This dictation was prepared with Dragon dictation along with smaller phrase technology. Any transcriptional errors that result from this process are unintentional.

## 2021-08-26 NOTE — Patient Instructions (Signed)
Good to see you You're going to do great on your bike ride!! See you in 4 to 6 weeks just in case

## 2021-09-07 ENCOUNTER — Encounter: Payer: Self-pay | Admitting: Family Medicine

## 2021-09-08 ENCOUNTER — Encounter: Payer: Medicare Other | Admitting: Family Medicine

## 2021-09-15 DIAGNOSIS — Z23 Encounter for immunization: Secondary | ICD-10-CM | POA: Diagnosis not present

## 2021-09-29 NOTE — Progress Notes (Signed)
[facility] [address] [place] Phone: [phone] Subjective:   IVilma Meckel, am serving as a scribe for Dr. Hulan Saas. This visit occurred during the SARS-CoV-2 public health emergency.  Safety protocols were in place, including screening questions prior to the visit, additional usage of staff PPE, and extensive cleaning of exam room while observing appropriate contact time as indicated for disinfecting solutions.   I'm seeing this patient by the request  of:  [name], MD  CC: back and neck pain   [id number]  Allison [name] is a 70 y.o. female coming in with complaint of back and neck pain. OMT on 08/26/2021. Was able to do a 50 mile bike ride in the beginning of September without any significant difficulty.  Patient states back and neck pain remain the same. Having a catching pain that starts on left side rib cage and moves to the right sporadically. 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 DEXA 03/2020 Osteoporosis T = -2.5 at L1-2, lowest, femurs osteopenia, on fosamax. Continue fosamax.    Personal history of chemotherapy    Personal history of radiation therapy    PONV (postoperative nausea and vomiting)    UTI (urinary tract infection)     Allergies  Allergen Reactions   Penicillins     Childhood allergy   Sulfa Antibiotics Hives and Swelling     Review of Systems:  No headache, visual changes, nausea, vomiting, diarrhea, constipation, dizziness, abdominal pain, skin rash, fevers, chills, night sweats, weight loss,  swollen lymph nodes, body aches, joint swelling, chest pain, shortness of breath, mood changes. POSITIVE muscle aches  Objective  Blood pressure 116/68, pulse 61, height 5\' 4"  (1.626 m), weight 117 lb (53.1 kg), SpO2 96 %.   General: No apparent distress alert and oriented x3 mood and affect normal, dressed appropriately.  HEENT: Pupils equal, extraocular movements intact  Respiratory: Patient's speak in full sentences and does not appear short of breath  Cardiovascular: No lower extremity edema, non tender, no erythema  Low back exam still has some mild loss of lordosis with some mild degenerative scoliosis noted.  Patient does have some limited range of motion of the right hip still noted.  Osteopathic findings  T6 extended rotated and side bent right T9 extended rotated and side bent left L2 flexed rotated and side bent right Sacrum right on right       Assessment and Plan:  Lumbar herniated disc Patient has been doing relatively well at this point.  Patient has made some progress.  Discussed icing regimen and home exercises.  Discussed the gabapentin.  Patient did have more pain in the scapular region and did give some scapular dyskinesis exercises that I think will be helpful.  Follow-up with me again in 4 to 8 weeks   Nonallopathic problems  Decision today to treat with OMT was based on Physical Exam  After verbal consent patient was treated with HVLA, ME,  FPR techniques in  thoracic, lumbar, and sacral  areas  Patient tolerated the procedure well with improvement in symptoms  Patient given exercises, stretches and lifestyle modifications  See medications in patient instructions if given  Patient will follow up in 4-8 weeks      The above documentation has been reviewed and is accurate and complete Lyndal Pulley, DO        Note: This dictation was prepared with Dragon dictation along with smaller phrase technology. Any transcriptional errors that result from  this process are unintentional.

## 2021-09-30 ENCOUNTER — Other Ambulatory Visit: Payer: Self-pay

## 2021-09-30 ENCOUNTER — Ambulatory Visit (INDEPENDENT_AMBULATORY_CARE_PROVIDER_SITE_OTHER): Payer: Medicare Other | Admitting: Family Medicine

## 2021-09-30 VITALS — BP 116/68 | HR 61 | Ht 64.0 in | Wt 117.0 lb

## 2021-09-30 DIAGNOSIS — M9902 Segmental and somatic dysfunction of thoracic region: Secondary | ICD-10-CM

## 2021-09-30 DIAGNOSIS — M9903 Segmental and somatic dysfunction of lumbar region: Secondary | ICD-10-CM

## 2021-09-30 DIAGNOSIS — M9904 Segmental and somatic dysfunction of sacral region: Secondary | ICD-10-CM

## 2021-09-30 DIAGNOSIS — M5126 Other intervertebral disc displacement, lumbar region: Secondary | ICD-10-CM

## 2021-09-30 NOTE — Patient Instructions (Signed)
Do prescribed exercises at least 3x a week Use as cool down after swimming Keep butt on bike See you again in 6-8 weeks

## 2021-09-30 NOTE — Assessment & Plan Note (Signed)
Patient has been doing relatively well at this point.  Patient has made some progress.  Discussed icing regimen and home exercises.  Discussed the gabapentin.  Patient did have more pain in the scapular region and did give some scapular dyskinesis exercises that I think will be helpful.  Follow-up with me again in 4 to 8 weeks

## 2021-10-02 ENCOUNTER — Encounter: Payer: Medicare Other | Admitting: Family Medicine

## 2021-10-08 ENCOUNTER — Telehealth: Payer: Self-pay

## 2021-10-08 DIAGNOSIS — Z23 Encounter for immunization: Secondary | ICD-10-CM | POA: Diagnosis not present

## 2021-10-08 NOTE — Telephone Encounter (Signed)
Patient called in to advise [place] that she received her flu shot today.

## 2021-10-08 NOTE — Telephone Encounter (Signed)
Chart has been updated.

## 2021-10-14 DIAGNOSIS — L821 Other seborrheic keratosis: Secondary | ICD-10-CM | POA: Diagnosis not present

## 2021-10-14 DIAGNOSIS — D2262 Melanocytic nevi of left upper limb, including shoulder: Secondary | ICD-10-CM | POA: Diagnosis not present

## 2021-10-14 DIAGNOSIS — D2271 Melanocytic nevi of right lower limb, including hip: Secondary | ICD-10-CM | POA: Diagnosis not present

## 2021-10-14 DIAGNOSIS — L570 Actinic keratosis: Secondary | ICD-10-CM | POA: Diagnosis not present

## 2021-10-14 DIAGNOSIS — Z85828 Personal history of other malignant neoplasm of skin: Secondary | ICD-10-CM | POA: Diagnosis not present

## 2021-10-14 DIAGNOSIS — D225 Melanocytic nevi of trunk: Secondary | ICD-10-CM | POA: Diagnosis not present

## 2021-10-21 ENCOUNTER — Encounter: Payer: Medicare Other | Admitting: Family Medicine

## 2021-11-11 DIAGNOSIS — Z85828 Personal history of other malignant neoplasm of skin: Secondary | ICD-10-CM | POA: Diagnosis not present

## 2021-11-11 DIAGNOSIS — L570 Actinic keratosis: Secondary | ICD-10-CM | POA: Diagnosis not present

## 2021-11-11 DIAGNOSIS — L821 Other seborrheic keratosis: Secondary | ICD-10-CM | POA: Diagnosis not present

## 2021-11-16 ENCOUNTER — Ambulatory Visit: Payer: Medicare Other | Admitting: Family Medicine

## 2021-12-16 NOTE — Progress Notes (Signed)
[place] Phone: [phone] Subjective:   Allison [name], am serving as a scribe for Dr. Hulan [name]. This visit occurred during the SARS-CoV-2 public health emergency.  Safety protocols were in place, including screening questions prior to the visit, additional usage of staff PPE, and extensive cleaning of exam room while observing appropriate contact time as indicated for disinfecting solutions.   I'm seeing this patient by the request  of:  Allison Arnt, MD  CC: Back pain follow-up  [id number]  Allison [name] is a 70 y.o. female coming in with complaint of back and neck pain. OMT 09/30/2021. Patient states that she has been doing well.  Mild tightness.  Patient has been biking some.  Wants to start swimming a little more and workout on a more regular basis.  Likely [name] radicular symptoms.  Medications patient has been prescribed: None  Taking:         Reviewed prior external information including notes and imaging from previsou exam, outside providers and external EMR if available.   As well as notes that were available from care everywhere and other healthcare systems.  Past medical history, social, surgical and family history all reviewed in electronic medical record.  [name]   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 DEXA 03/2020 Osteoporosis T = -2.5 at L1-2, lowest, femurs osteopenia, on fosamax. Continue fosamax.    Personal history of chemotherapy    Personal history of radiation therapy    PONV (postoperative nausea and vomiting)    UTI (urinary tract infection)      Allergies  Allergen Reactions   Penicillins     Childhood allergy   Sulfa Antibiotics Hives and Swelling     Review of Systems:  [name], visual changes, nausea, vomiting, diarrhea, constipation, dizziness, abdominal pain, skin rash, fevers, chills, night sweats, weight loss, swollen lymph nodes, body aches, joint swelling, chest pain, shortness of breath, mood changes. POSITIVE muscle aches  Objective  Blood pressure 102/66, pulse 70, height 5\' 4"  (1.626 m), weight 118 lb (53.5 kg), SpO2 99 %.   General: [name], dressed appropriately.  HEENT: Pupils equal, extraocular movements intact  Respiratory: Patient's speak in full sentences and does not appear short of breath  Cardiovascular: [name], non tender, [name]  Neuro: Cranial nerves II through XII are intact, neurovascularly intact in all extremities with 2+ DTRs and 2+ pulses.  Gait normal with good balance and coordination.  MSK:  Non tender with full range of motion and good stability and symmetric strength and tone of shoulders, elbows, wrist, hip, knee and ankles bilaterally.  Back -low back exam does have some mild loss of lordosis.  Some tenderness to palpation of the paraspinal musculature.  Mild tightness with extension.  Osteopathic findings  T9 extended rotated and side bent left L2 flexed rotated and side bent right Sacrum right on right       Assessment and Plan:  Lumbar herniated disc Patient has been doing relatively  well overall.  Has had some tightness recently.  Responding well to osteopathic manipulation.  Stable overall.  Discussed icing regimen and home exercises.  Increase activity slowly.  Follow-up again in 6 to 8 weeks    Nonallopathic problems  Decision today to treat with OMT was based on Physical Exam  After verbal consent patient was treated with HVLA, ME, FPR techniques in  thoracic, lumbar, and sacral   areas  Patient tolerated the procedure well with improvement in symptoms  Patient given exercises, stretches and lifestyle modifications  See medications in patient instructions if given  Patient will follow up in 6-8 weeks     The above documentation has been reviewed and is accurate and complete Allison Pulley, DO        Note: This dictation was prepared with Dragon dictation along with smaller phrase technology. Any transcriptional errors that result from this process are unintentional.

## 2021-12-22 ENCOUNTER — Ambulatory Visit (INDEPENDENT_AMBULATORY_CARE_PROVIDER_SITE_OTHER): Payer: Medicare Other | Admitting: Family Medicine

## 2021-12-22 ENCOUNTER — Other Ambulatory Visit: Payer: Self-pay

## 2021-12-22 ENCOUNTER — Encounter: Payer: Self-pay | Admitting: Family Medicine

## 2021-12-22 VITALS — BP 102/66 | HR 70 | Ht 64.0 in | Wt 118.0 lb

## 2021-12-22 DIAGNOSIS — M9904 Segmental and somatic dysfunction of sacral region: Secondary | ICD-10-CM | POA: Diagnosis not present

## 2021-12-22 DIAGNOSIS — M5126 Other intervertebral disc displacement, lumbar region: Secondary | ICD-10-CM | POA: Diagnosis not present

## 2021-12-22 DIAGNOSIS — M9902 Segmental and somatic dysfunction of thoracic region: Secondary | ICD-10-CM

## 2021-12-22 DIAGNOSIS — M9903 Segmental and somatic dysfunction of lumbar region: Secondary | ICD-10-CM | POA: Diagnosis not present

## 2021-12-22 NOTE — Assessment & Plan Note (Signed)
Patient has been doing relatively well overall.  Has had some tightness recently.  Responding well to osteopathic manipulation.  Stable overall.  Discussed icing regimen and home exercises.  Increase activity slowly.  Follow-up again in 6 to 8 weeks

## 2021-12-22 NOTE — Patient Instructions (Signed)
See me in 6-8 weeks Have a good time at [facility]

## 2021-12-25 ENCOUNTER — Other Ambulatory Visit: Payer: Self-pay | Admitting: Family Medicine

## 2021-12-25 DIAGNOSIS — Z1231 Encounter for screening mammogram for malignant neoplasm of breast: Secondary | ICD-10-CM

## 2022-01-01 ENCOUNTER — Encounter: Payer: Self-pay | Admitting: Family Medicine

## 2022-01-01 DIAGNOSIS — H903 Sensorineural hearing loss, bilateral: Secondary | ICD-10-CM

## 2022-01-03 ENCOUNTER — Other Ambulatory Visit: Payer: Self-pay | Admitting: Family Medicine

## 2022-01-03 DIAGNOSIS — M810 Age-related osteoporosis without current pathological fracture: Secondary | ICD-10-CM

## 2022-01-04 ENCOUNTER — Ambulatory Visit (INDEPENDENT_AMBULATORY_CARE_PROVIDER_SITE_OTHER): Payer: Medicare Other | Admitting: Family Medicine

## 2022-01-04 ENCOUNTER — Other Ambulatory Visit: Payer: Self-pay

## 2022-01-04 ENCOUNTER — Encounter: Payer: Self-pay | Admitting: Family Medicine

## 2022-01-04 VITALS — BP 130/82 | HR 65 | Temp 98.0°F | Ht 64.0 in | Wt 119.6 lb

## 2022-01-04 DIAGNOSIS — E7800 Pure hypercholesterolemia, unspecified: Secondary | ICD-10-CM

## 2022-01-04 DIAGNOSIS — M810 Age-related osteoporosis without current pathological fracture: Secondary | ICD-10-CM | POA: Diagnosis not present

## 2022-01-04 DIAGNOSIS — Z23 Encounter for immunization: Secondary | ICD-10-CM | POA: Diagnosis not present

## 2022-01-04 DIAGNOSIS — M1611 Unilateral primary osteoarthritis, right hip: Secondary | ICD-10-CM

## 2022-01-04 DIAGNOSIS — H903 Sensorineural hearing loss, bilateral: Secondary | ICD-10-CM | POA: Diagnosis not present

## 2022-01-04 DIAGNOSIS — F341 Dysthymic disorder: Secondary | ICD-10-CM | POA: Diagnosis not present

## 2022-01-04 DIAGNOSIS — M5126 Other intervertebral disc displacement, lumbar region: Secondary | ICD-10-CM | POA: Diagnosis not present

## 2022-01-04 DIAGNOSIS — Z853 Personal history of malignant neoplasm of breast: Secondary | ICD-10-CM

## 2022-01-04 LAB — CBC WITH DIFFERENTIAL/PLATELET
Basophils Absolute: 0 10*3/uL (ref 0.0–0.1)
Basophils Relative: 0.6 % (ref 0.0–3.0)
Eosinophils Absolute: 0.1 10*3/uL (ref 0.0–0.7)
Eosinophils Relative: 0.7 % (ref 0.0–5.0)
HCT: 39 % (ref 36.0–46.0)
Hemoglobin: 12.9 g/dL (ref 12.0–15.0)
Lymphocytes Relative: 16.5 % (ref 12.0–46.0)
Lymphs Abs: 1.2 10*3/uL (ref 0.7–4.0)
MCHC: 33.2 g/dL (ref 30.0–36.0)
MCV: 95.2 fl (ref 78.0–100.0)
Monocytes Absolute: 0.4 10*3/uL (ref 0.1–1.0)
Monocytes Relative: 6.2 % (ref 3.0–12.0)
Neutro Abs: 5.5 10*3/uL (ref 1.4–7.7)
Neutrophils Relative %: 76 % (ref 43.0–77.0)
Platelets: 295 10*3/uL (ref 150.0–400.0)
RBC: 4.1 Mil/uL (ref 3.87–5.11)
RDW: 14 % (ref 11.5–15.5)
WBC: 7.2 10*3/uL (ref 4.0–10.5)

## 2022-01-04 MED ORDER — SERTRALINE HCL 25 MG PO TABS: 25.0000 mg | ORAL_TABLET | Freq: Every day | ORAL | 3 refills | Status: DC

## 2022-01-04 NOTE — Patient Instructions (Signed)
Please return in 12 months for your annual complete physical; please come fasting.   I will release your lab results to you on your MyChart account with further instructions. Please reply with any questions.    Today you were given your Prevnar 20 vaccination.    If you have any questions or concerns, please don't hesitate to send me a message via MyChart or call the office at [phone]. Thank you for visiting with [place] today! It's our pleasure caring for you.   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  The phone number is: [phone]  Or can look at [employer] for other options in our community.   Preventive Care 60 Years and Older, Female Preventive care refers to lifestyle choices and visits with your health care provider that can promote health and wellness. Preventive care visits are also called wellness exams. What can I expect for my preventive care visit? Counseling During your preventive care visit, your health care provider may ask about your: Medical history, including: Past medical problems. Family medical history. History of falls. Current health, including: Emotional well-being. Home life and relationship well-being. Sexual activity. Memory and ability to understand (cognition). Lifestyle, including: Alcohol, nicotine or tobacco, and drug use. Access to firearms. Diet, exercise, and sleep habits. Work and work Statistician. Sunscreen use. Safety issues such as seatbelt and bike helmet use. Physical exam Your health care provider will check your: Height and weight. These may be used to calculate your BMI (body mass index). BMI is a measurement that tells if you are at a healthy weight. Waist circumference. This measures the distance around your waistline. This measurement also tells if you are at a healthy weight and may help predict your risk of certain diseases, such as  type 2 diabetes and high blood pressure. Heart rate and blood pressure. Body temperature. Skin for abnormal spots. What immunizations do I need? Vaccines are usually given at various ages, according to a schedule. Your health care provider will recommend vaccines for you based on your age, medical history, and lifestyle or other factors, such as travel or where you work. What tests do I need? Screening Your health care provider may recommend screening tests for certain conditions. This may include: Lipid and cholesterol levels. Diabetes screening. This is done by checking your blood sugar (glucose) after you have not eaten for a while (fasting). Hepatitis C test. Hepatitis B test. HIV (human immunodeficiency virus) test. STI (sexually transmitted infection) testing, if you are at risk. Lung cancer screening. Colorectal cancer screening. Prostate cancer screening. Abdominal aortic aneurysm (AAA) screening. You may need this if you are a current or former smoker. Talk with your health care provider about your test results, treatment options, and if necessary, the need for more tests. Follow these instructions at home: Eating and drinking  Eat a diet that includes fresh fruits and vegetables, whole grains, lean protein, and low-fat dairy products. Limit your intake of foods with high amounts of sugar, saturated fats, and salt. Take vitamin and mineral supplements as recommended by your health care provider. Do not drink alcohol if your health care provider tells you not to drink. If you drink alcohol: Limit how much you have to 0-2 drinks a day. Know how much alcohol is in your drink. In the [place] one drink equals one 12 oz bottle of beer (355 mL), one 5 oz glass of wine (148 mL), or one  1 oz glass of hard liquor (44 mL). Lifestyle Brush your teeth every morning and night with fluoride toothpaste. Floss one time each day. Exercise for at least 30 minutes 5 or more days each week. Do not  use any products that contain nicotine or tobacco. These products include cigarettes, chewing tobacco, and vaping devices, such as e-cigarettes. If you need help quitting, ask your health care provider. Do not use drugs. If you are sexually active, practice safe sex. Use a condom or other form of protection to prevent STIs. Take aspirin only as told by your health care provider. Make sure that you understand how much to take and what form to take. Work with your health care provider to find out whether it is safe and beneficial for you to take aspirin daily. Ask your health care provider if you need to take a cholesterol-lowering medicine (statin). Find healthy ways to manage stress, such as: Meditation, yoga, or listening to music. Journaling. Talking to a trusted person. Spending time with friends and family. Safety Always wear your seat belt while driving or riding in a vehicle. Do not drive: If you have been drinking alcohol. Do not ride with someone who has been drinking. When you are tired or distracted. While texting. If you have been using any mind-altering substances or drugs. Wear a helmet and other protective equipment during sports activities. If you have firearms in your house, make sure you follow all gun safety procedures. Minimize exposure to UV radiation to reduce your risk of skin cancer. What's next? Visit your health care provider once a year for an annual wellness visit. Ask your health care provider how often you should have your eyes and teeth checked. Stay up to date on all vaccines. This information is not intended to replace advice given to you by your health care provider. Make sure you discuss any questions you have with your health care provider. Document Revised: 06/10/2021 Document Reviewed: 06/10/2021 Elsevier Patient Education  [place].

## 2022-01-04 NOTE — Progress Notes (Signed)
Subjective  Chief Complaint  Patient presents with   Annual Exam    Non fasting    HPI: Allison [name] is a 71 y.o. female who presents to [facility] [place] [facility] today for a Female Wellness Visit. She also has the concerns and/or needs as listed above in the chief complaint. These will be addressed in addition to the Health Maintenance Visit.   Wellness Visit: annual visit with health maintenance review and exam without Pap  HM: Mammogram and bone density screens are coming due.  Colorectal cancer screen is up-to-date.  She is eligible for Prevnar 20.  Shingrix and flu vaccinations are up-to-date.  Very active, healthy lifestyle.  Exercises regularly, pickleball, cycling and swimming.  Overall doing very well.  Married with no children.  Working on Technical sales engineer, estate planning now. Chronic disease f/u and/or acute problem visit: (deemed necessary to be done in addition to the wellness visit): Dysthymia: History of major depression back in the 90s.  Has been on sertraline since.  Has been well controlled and now on low-dose sertraline 25 mg daily.  New reflective process of estate planning, she is inquiring about seeing a therapist and will do a few things that may be involved with her childhood.  No active depressive symptoms.  No suicidal ideation.  No anxiety symptoms.  No adverse effects from medications. Osteoporosis: Taking Fosamax, this will be her fourth year.  Had thumb fracture last year.  Traumatic.  Takes calcium and vitamin D.  Due for bone density in April Chronic back pain and DJD: Doing much better with sports medicine exercises and medications.  Has herniated disc.  Tolerating gabapentin.  No weakness Hearing loss with hearing aids: Due for recheck.  Has appointment scheduled.  Assessment  1. Dysthymia   2. Sensorineural hearing loss (SNHL) of both ears   3. Hx of breast cancer, left   4. Lumbar herniated disc   5. Osteoporosis of lumbar spine   6.  Primary osteoarthritis of right hip   7. Pure hypercholesterolemia      Plan  Female Wellness Visit: Age appropriate Health Maintenance and Prevention measures were discussed with patient. Included topics are cancer screening recommendations, ways to keep healthy (see AVS) including dietary and exercise recommendations, regular eye and dental care, use of seat belts, and avoidance of moderate alcohol use and tobacco use.  Mammogram to be scheduled.  Bone density ordered BMI: discussed patient's BMI and encouraged positive lifestyle modifications to help get to or maintain a target BMI. HM needs and immunizations were addressed and ordered. See below for orders. See HM and immunization section for updates.  Education about Prevnar given.  Updated today. Routine labs and screening tests ordered including cmp, cbc and lipids where appropriate. Discussed recommendations regarding Vit D and calcium supplementation (see AVS)  Chronic disease management visit and/or acute problem visit: Dysthymia: Stable and well-controlled on sertraline 25 mg daily.  Refilled.  Discussed options for psychotherapy.  Patient to schedule Chronic back pain per sports medicine.  Much improved.  Continue gabapentin Screen for hyperlipidemia.  has high HDL.  No cardiovascular disease or significant risk factors Hearing loss for follow-up Follow up: Return in about 1 year (around 01/04/2023) for complete physical.  Orders Placed This Encounter  Procedures   DG Bone Density   CBC with Differential/Platelet   Comprehensive metabolic panel   Lipid panel   TSH   Meds ordered this encounter  Medications   sertraline (ZOLOFT) 25 MG  tablet    Sig: Take 1 tablet (25 mg total) by mouth daily.    Dispense:  90 tablet    Refill:  3      Body mass index is 20.53 kg/m. Wt Readings from Last 3 Encounters:  01/04/22 119 lb 9.6 oz (54.3 kg)  12/22/21 118 lb (53.5 kg)  09/30/21 117 lb (53.1 kg)     Patient Active Problem  List   Diagnosis Date Noted   Dysthymia 03/23/2019    Priority: High    Stable on Zoloft 25 mg q hs. Continue.     Hx of breast cancer, left     Priority: High    2000; dxd in [place]; treated with left lumpectomy, chemo and radiation tx, 5 years of tamoxifen    Osteoporosis of lumbar spine 04/01/2020    Priority: Medium     DEXA 02/2018 Osteopenia T = -1.9 femur, T = -2.2  (T1 = -3.2, T2= -2.5) at lumbar spine; was started on fosamax at that time. DEXA 03/2020 Osteoporosis T = -2.5 at L1-2, lowest, femurs osteopenia, on fosamax. Continue fosamax.     Primary osteoarthritis of right hip 11/02/2019    Priority: Medium    Lumbar herniated disc 03/23/2019    Priority: Medium     Hx of bulging disc. Has been seeing Dr. Rhys Martini, would like to see Dr. Paulla Fore.    Sensorineural hearing loss (SNHL) of both ears 01/04/2022    Priority: Low    [facility]; hearing aides    Metatarsalgia of left foot 11/02/2019    Priority: Low   Nonallopathic lesion of lumbosacral region 10/16/2019   Nonallopathic lesion of sacral region 10/16/2019   Nonallopathic lesion of thoracic region 10/16/2019   Health Maintenance  Topic Date Due   Pneumonia Vaccine 70+ Years old (1 - PCV) 07/16/1957   MAMMOGRAM  02/02/2022   DEXA SCAN  04/15/2022   COLONOSCOPY (Pts 45-40yrs Insurance coverage will need to be confirmed)  12/07/2025   TETANUS/TDAP  02/10/2026   INFLUENZA VACCINE  Completed   COVID-19 Vaccine  Completed   Hepatitis C Screening  Completed   Zoster Vaccines- Shingrix  Completed   HPV VACCINES  Aged Out   Immunization History  Administered Date(s) Administered   Fluad Quad(high Dose 65+) 09/04/2019, 09/04/2020, 10/07/2021   Influenza-Unspecified 02/07/2015, 09/02/2018   PFIZER(Purple Top)SARS-COV-2 Vaccination 01/15/2020, 02/05/2020, 09/30/2020   Pfizer Covid-19 Vaccine Bivalent Booster 32yrs & up 04/22/2021   Pneumococcal-Unspecified 03/15/2018   Tdap 02/11/2016   Zoster Recombinat  (Shingrix) 09/07/2011, 01/06/2019, 05/08/2019   We updated and reviewed the patient's past history in detail and it is documented below. Allergies: Patient is allergic to penicillins and sulfa antibiotics. Past Medical History Patient  has a past medical history of Cancer ([place]), Chicken pox, Depression, Diverticulitis, Lumbar herniated disc, Osteoporosis of lumbar spine (04/01/2020), Personal history of chemotherapy, Personal history of radiation therapy, and PONV (postoperative nausea and vomiting). Past Surgical History Patient  has a past surgical history that includes Breast lumpectomy (Left, 2000); Breast biopsy (Left, 1999); Tonsilectomy/adenoidectomy with myringotomy; Abdominal hysterectomy; Tonsillectomy; and Open reduction internal fixation (orif) metacarpal (Left, 05/06/2020). Family History: Patient family history includes Alcohol abuse in her brother, father, and mother; Cancer in her brother and mother; Depression in her father; Early death in her father. Social History:  Patient  reports that she quit smoking about 49 years ago. Her smoking use included cigarettes. She has never used smokeless tobacco. She reports current alcohol use. She reports that she  does not use drugs.  Review of Systems: Constitutional: negative for fever or malaise Ophthalmic: negative for photophobia, double vision or loss of vision Cardiovascular: negative for chest pain, dyspnea on exertion, or new LE swelling Respiratory: negative for SOB or persistent cough Gastrointestinal: negative for abdominal pain, change in bowel habits or melena Genitourinary: negative for dysuria or gross hematuria, no abnormal uterine bleeding or disharge Musculoskeletal: negative for new gait disturbance or muscular weakness Integumentary: negative for new or persistent rashes, no breast lumps Neurological: negative for TIA or stroke symptoms Psychiatric: negative for SI or delusions Allergic/Immunologic: negative for  hives  Patient Care Team    Relationship Specialty Notifications Start End  [name], MD PCP - General Family Medicine  11/02/19   Haver[place], Jennefer Bravo, MD Referring Physician Dermatology  04/17/19   Jola Schmidt, MD Consulting Physician Ophthalmology  04/17/19   Dr. Marrianne Mood, DDS Consulting Physician Dentistry  04/17/19   Lonzo Candy, AUD Consulting Physician Audiology  04/15/20   Lyndal Pulley, DO Consulting Physician Sports Medicine  04/15/20     Objective  Vitals: BP 130/82    Pulse 65    Temp 98 F (36.7 C) (Temporal)    Ht 5\' 4"  (1.626 m)    Wt 119 lb 9.6 oz (54.3 kg)    SpO2 99%    BMI 20.53 kg/m  General:  Well developed, well nourished, no acute distress  Psych:  Alert and orientedx3,normal mood and affect HEENT:  Normocephalic, atraumatic, non-icteric sclera,  supple neck without adenopathy, mass or thyromegaly Cardiovascular:  Normal S1, S2, RRR without gallop, rub or murmur Respiratory:  Good breath sounds bilaterally, CTAB with normal respiratory effort Gastrointestinal: normal bowel sounds, soft, non-tender, no noted masses. No HSM MSK: no deformities, contusions. Joints are without erythema or swelling.  Skin:  Warm, no rashes or suspicious lesions noted, seborrheic keratoses present. Neurologic:    Mental status is normal. CN 2-11 are normal. Gross motor and sensory exams are normal. Normal gait. No tremor Breast Exam: No mass, skin retraction or nipple discharge is appreciated in either breast. No axillary adenopathy. Fibrocystic changes are not noted   Commons side effects, risks, benefits, and alternatives for medications and treatment plan prescribed today were discussed, and the patient expressed understanding of the given instructions. Patient is instructed to call or message via MyChart if he/she has any questions or concerns regarding our treatment plan. No barriers to understanding were identified. We discussed Red Flag symptoms and signs in  detail. Patient expressed understanding regarding what to do in case of urgent or emergency type symptoms.  Medication list was reconciled, printed and provided to the patient in AVS. Patient instructions and summary information was reviewed with the patient as documented in the AVS. This note was prepared with assistance of Dragon voice recognition software. Occasional wrong-word or sound-a-like substitutions may have occurred due to the inherent limitations of voice recognition software  This visit occurred during the SARS-CoV-2 public health emergency.  Safety protocols were in place, including screening questions prior to the visit, additional usage of staff PPE, and extensive cleaning of exam room while observing appropriate contact time as indicated for disinfecting solutions.

## 2022-01-05 LAB — COMPREHENSIVE METABOLIC PANEL
ALT: 12 U/L (ref 0–35)
AST: 22 U/L (ref 0–37)
Albumin: 4.4 g/dL (ref 3.5–5.2)
Alkaline Phosphatase: 33 U/L — ABNORMAL LOW (ref 39–117)
BUN: 16 mg/dL (ref 6–23)
CO2: 27 mEq/L (ref 19–32)
Calcium: 10 mg/dL (ref 8.4–10.5)
Chloride: 100 mEq/L (ref 96–112)
Creatinine, Ser: 0.8 mg/dL (ref 0.40–1.20)
GFR: 74.62 mL/min (ref >60.00)
Glucose, Bld: 86 mg/dL (ref 70–99)
Potassium: 4.6 mEq/L (ref 3.5–5.1)
Sodium: 134 mEq/L — ABNORMAL LOW (ref 135–145)
Total Bilirubin: 0.7 mg/dL (ref 0.2–1.2)
Total Protein: 6.6 g/dL (ref 6.0–8.3)

## 2022-01-05 LAB — LIPID PANEL
Cholesterol: 234 mg/dL — ABNORMAL HIGH (ref 0–200)
HDL: 91.3 mg/dL (ref >39.00)
LDL Cholesterol: 116 mg/dL — ABNORMAL HIGH (ref 0–99)
NonHDL: 143.11
Total CHOL/HDL Ratio: 3
Triglycerides: 137 mg/dL (ref 0.0–149.0)
VLDL: 27.4 mg/dL (ref 0.0–40.0)

## 2022-01-05 LAB — TSH: TSH: 1.66 u[IU]/mL (ref 0.35–5.50)

## 2022-01-06 DIAGNOSIS — L821 Other seborrheic keratosis: Secondary | ICD-10-CM | POA: Diagnosis not present

## 2022-01-06 DIAGNOSIS — Z85828 Personal history of other malignant neoplasm of skin: Secondary | ICD-10-CM | POA: Diagnosis not present

## 2022-01-11 ENCOUNTER — Ambulatory Visit: Payer: Medicare Other | Admitting: Family Medicine

## 2022-01-11 ENCOUNTER — Encounter: Payer: Self-pay | Admitting: Family Medicine

## 2022-01-12 DIAGNOSIS — Z822 Family history of deafness and hearing loss: Secondary | ICD-10-CM | POA: Diagnosis not present

## 2022-01-12 DIAGNOSIS — H903 Sensorineural hearing loss, bilateral: Secondary | ICD-10-CM | POA: Diagnosis not present

## 2022-01-12 DIAGNOSIS — Z77122 Contact with and (suspected) exposure to noise: Secondary | ICD-10-CM | POA: Diagnosis not present

## 2022-01-12 DIAGNOSIS — H9313 Tinnitus, bilateral: Secondary | ICD-10-CM | POA: Diagnosis not present

## 2022-01-15 DIAGNOSIS — H25013 Cortical age-related cataract, bilateral: Secondary | ICD-10-CM | POA: Diagnosis not present

## 2022-01-15 DIAGNOSIS — H5213 Myopia, bilateral: Secondary | ICD-10-CM | POA: Diagnosis not present

## 2022-01-18 ENCOUNTER — Ambulatory Visit: Payer: Medicare Other | Admitting: Family Medicine

## 2022-01-18 ENCOUNTER — Other Ambulatory Visit: Payer: Self-pay

## 2022-01-20 ENCOUNTER — Other Ambulatory Visit: Payer: Self-pay | Admitting: Family Medicine

## 2022-01-20 DIAGNOSIS — M810 Age-related osteoporosis without current pathological fracture: Secondary | ICD-10-CM

## 2022-02-03 ENCOUNTER — Ambulatory Visit
Admission: RE | Admit: 2022-02-03 | Discharge: 2022-02-03 | Disposition: A | Discharge status: Home / Self Care | Payer: Medicare Other | Source: Ambulatory Visit | Attending: Family Medicine | Admitting: Family Medicine

## 2022-02-03 DIAGNOSIS — Z1231 Encounter for screening mammogram for malignant neoplasm of breast: Secondary | ICD-10-CM | POA: Diagnosis not present

## 2022-02-09 NOTE — Progress Notes (Signed)
Allison [name] Phone: [phone] Subjective:   Allison [name], am serving as a scribe for Dr. Hulan [name]. This visit occurred during the SARS-CoV-2 public health emergency.  Safety protocols were in place, including screening questions prior to the visit, additional usage of staff PPE, and extensive cleaning of exam room while observing appropriate contact time as indicated for disinfecting solutions.  I'm seeing this patient by the request  of:  Allison Arnt, MD  CC: Neck and back pain follow-up  IEP:[id number]  Allison [name] is a 71 y.o. female coming in with complaint of back and neck pain. OMT 12/22/2021. Patient states that she has been doing well. Has intermittent flares which she uses Aleve for at times. Has been having some rib pain in thoracic spine and anterior ribs.  Patient states that this happened 1 time reducing pain to bed.  Did do a lot of cleaning She will cause some more difficulty.  Medications patient has been prescribed: None  Taking:         Reviewed prior external information including notes and imaging from previsou exam, outside providers and external EMR if available.   As well as notes that were available from care everywhere and other healthcare systems.  Past medical history, social, surgical and family history all reviewed in electronic medical record.  [name]   Past Medical History:  Diagnosis Date   Cancer ([place])    Chicken pox    Depression    Diverticulitis    Lumbar herniated disc    Osteoporosis of lumbar spine 04/01/2020   DEXA 02/2018 Osteopenia T = -1.9 femur, T = -2.2  (T1 = -3.2, T2= -2.5) at lumbar spine; was started on fosamax at that time. DEXA 03/2020 Osteoporosis T = -2.5 at L1-2, lowest, femurs osteopenia, on fosamax. Continue fosamax.    Personal history of chemotherapy    Personal  history of radiation therapy    PONV (postoperative nausea and vomiting)     Allergies  Allergen Reactions   Penicillins     Childhood allergy   Sulfa Antibiotics Hives and Swelling     Review of Systems:  [name], visual changes, nausea, vomiting, diarrhea, constipation, dizziness, abdominal pain, skin rash, fevers, chills, night sweats, weight loss, swollen lymph nodes, body aches, joint swelling, chest pain, shortness of breath, mood changes. POSITIVE muscle aches  Objective  Blood pressure 90/62, pulse 88, height 5\' 4"  (1.626 m), weight 115 lb (52.2 kg), SpO2 98 %.   General: [name], dressed appropriately.  HEENT: Pupils equal, extraocular movements intact  Respiratory: Patient's speak in full sentences and does not appear short of breath  Cardiovascular: [name], non tender, [name]  Patient has had some improvement noted in the paraspinal musculature.  Patient has improvement with the hip abductor strengthening as well.  Patient noted to still have some mild tightness noted with extension of the back.  Tightness in the thoracal lumbar junction as well  Osteopathic findings   T11 extended rotated and side bent left L1 flexed rotated and side bent right Sacrum right on right       Assessment and Plan:  Lumbar herniated disc Patient has been doing remarkably well at this time.  Responding extremely well to osteopathic manipulation.  The patient has improved her core strengthening.  He started  to increase with muscle tone as well as muscle strengthening.  Follow-up again in 2 months   Nonallopathic problems  Decision today to treat with OMT was based on Physical Exam  After verbal consent patient was treated with HVLA, ME, FPR techniques in  thoracic, lumbar, and sacral  areas  Patient tolerated the procedure well with improvement in symptoms  Patient given exercises, stretches and  lifestyle modifications  See medications in patient instructions if given  Patient will follow up in 4-8 weeks      The above documentation has been reviewed and is accurate and complete Allison Pulley, DO        Note: This dictation was prepared with Dragon dictation along with smaller phrase technology. Any transcriptional errors that result from this process are unintentional.

## 2022-02-10 ENCOUNTER — Encounter: Payer: Self-pay | Admitting: Family Medicine

## 2022-02-10 ENCOUNTER — Other Ambulatory Visit: Payer: Self-pay

## 2022-02-10 ENCOUNTER — Ambulatory Visit (INDEPENDENT_AMBULATORY_CARE_PROVIDER_SITE_OTHER): Payer: Medicare Other | Admitting: Family Medicine

## 2022-02-10 VITALS — BP 90/62 | HR 88 | Ht 64.0 in | Wt 115.0 lb

## 2022-02-10 DIAGNOSIS — M9903 Segmental and somatic dysfunction of lumbar region: Secondary | ICD-10-CM

## 2022-02-10 DIAGNOSIS — M9902 Segmental and somatic dysfunction of thoracic region: Secondary | ICD-10-CM | POA: Diagnosis not present

## 2022-02-10 DIAGNOSIS — M9904 Segmental and somatic dysfunction of sacral region: Secondary | ICD-10-CM

## 2022-02-10 DIAGNOSIS — M5126 Other intervertebral disc displacement, lumbar region: Secondary | ICD-10-CM

## 2022-02-10 NOTE — Assessment & Plan Note (Signed)
Patient has been doing remarkably well at this time.  Responding extremely well to osteopathic manipulation.  The patient has improved her core strengthening.  He started to increase with muscle tone as well as muscle strengthening.  Follow-up again in 2 months

## 2022-02-10 NOTE — Patient Instructions (Addendum)
Be proud Don't change a thing See me in 2-3 months

## 2022-02-18 DIAGNOSIS — Z08 Encounter for follow-up examination after completed treatment for malignant neoplasm: Secondary | ICD-10-CM | POA: Diagnosis not present

## 2022-02-18 DIAGNOSIS — Z853 Personal history of malignant neoplasm of breast: Secondary | ICD-10-CM | POA: Diagnosis not present

## 2022-03-31 ENCOUNTER — Ambulatory Visit: Payer: Medicare Other | Admitting: Family Medicine

## 2022-04-06 ENCOUNTER — Encounter: Payer: Self-pay | Admitting: Family

## 2022-04-06 ENCOUNTER — Ambulatory Visit (INDEPENDENT_AMBULATORY_CARE_PROVIDER_SITE_OTHER): Payer: Medicare Other | Admitting: Family

## 2022-04-06 VITALS — BP 124/75 | HR 61 | Temp 97.3°F | Ht 64.0 in | Wt 120.1 lb

## 2022-04-06 DIAGNOSIS — S2341XA Sprain of ribs, initial encounter: Secondary | ICD-10-CM

## 2022-04-06 NOTE — Progress Notes (Signed)
? ?Subjective:  ? ? ? Patient ID: Allison [name], female    DOB: 09-29-1951, 71 y.o.   MRN: [id number] ? ?Chief Complaint  ?Patient presents with  ? Chest Pain  ?  Pt c/o sharp rib pain when doing certain exercises or leaning over. Has tried Aleve which did help a little. Present since Saturday.  ? ?HPI: ?Pain ?She reports new onset left side rib pain. There was not an injury that may have caused the pain. The pain started a few days ago and is staying constant. The pain does not radiate. The pain is described as aching, sharp, and soreness, is moderate in intensity, occurring intermittently. Symptoms are worse in the: anytime with movement  ?She has tried NSAIDs with mild relief.  ? ?There are no preventive care reminders to display for this patient. ? ?Past Medical History:  ?Diagnosis Date  ? Cancer [facility])   ? Chicken pox   ? Depression   ? Diverticulitis   ? Lumbar herniated disc   ? 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 DEXA 02/2018 Osteopenia T = -1.9 femur, T = -2.2  (T1 = -3.2, T2= -2.5) at lumbar spine; was started on fosamax at that time. DEXA 03/2020 Osteoporosis T = -2.5 at L1-2, lowest, femurs osteopenia, on fosamax. Continue fosamax.   ? Personal history of chemotherapy   ? Personal history of radiation therapy   ? PONV (postoperative nausea and vomiting)   ? ? ?Past Surgical History:  ?Procedure Laterality Date  ? ABDOMINAL HYSTERECTOMY    ? BREAST BIOPSY Left 1999  ? BREAST LUMPECTOMY Left 2000  ? OPEN REDUCTION INTERNAL FIXATION (ORIF) METACARPAL Left 05/06/2020  ? Procedure: OPEN REDUCTION INTERNAL FIXATION (ORIF) LEFT THUMB METACARPAL FRACTURE;  Surgeon: [name], MD;  Location: [facility];  Service: Orthopedics;  Laterality: Left;  block in preop  ? TONSILECTOMY/ADENOIDECTOMY WITH MYRINGOTOMY    ? TONSILLECTOMY    ? ? ?Outpatient Medications Prior to Visit  ?Medication Sig Dispense Refill  ? alendronate (FOSAMAX) 70 MG tablet TAKE 1 TABLET WEEKLY ON AN EMPTY STOMACH WITH  A FULL  GLASS OF WATER. 12 tablet 3  ? Biotin 1 MG CAPS Take by mouth.    ? Calcium Citrate-Vitamin D (CALCIUM + D PO) Take by mouth.    ? clindamycin (CLEOCIN) 150 MG capsule Take 150 mg by mouth 3 (three) times daily.    ? diclofenac sodium (VOLTAREN) 1 % GEL Apply 2 grams topically to affected area qid 100 g 1  ? gabapentin (NEURONTIN) 100 MG capsule TAKE 2 CAPSULES BY MOUTH NIGHTLY AT BEDTIME 180 capsule 3  ? MAGNESIUM PO Take by mouth.    ? meloxicam (MOBIC) 15 MG tablet Take 1 tablet (15 mg total) by mouth as needed. 30 tablet 0  ? NIACIN PO Take by mouth.    ? NON FORMULARY Tart cherry extract    ? sertraline (ZOLOFT) 25 MG tablet Take 1 tablet (25 mg total) by mouth daily. 90 tablet 3  ? Turmeric 500 MG TABS Take by mouth.    ? ?No facility-administered medications prior to visit.  ? ? ?Allergies  ?Allergen Reactions  ? Penicillins   ?  Childhood allergy  ? Sulfa Antibiotics Hives and Swelling  ? ?   ?Objective:  ?  ?Physical Exam ?Vitals and nursing note reviewed.  ?Constitutional:   ?   Appearance: Normal appearance.  ?Cardiovascular:  ?   Rate and Rhythm: Normal rate and regular rhythm.  ?Pulmonary:  ?  Effort: Pulmonary effort is normal.  ?   Breath sounds: Normal breath sounds.  ?Chest:  ?   Chest wall: No swelling, tenderness or edema.  ?Musculoskeletal:     ?   General: Normal range of motion.  ?Skin: ?   General: Skin is warm and dry.  ?Neurological:  ?   Mental Status: She is alert.  ?Psychiatric:     ?   Mood and Affect: Mood normal.     ?   Behavior: Behavior normal.  ? ? ?BP 124/75 (BP Location: Right Arm, Patient Position: Sitting, Cuff Size: Normal)   Pulse 61   Temp (!) 97.3 ?F (36.3 ?C) (Temporal)   Ht '5\' 4"'$  (1.626 m)   Wt 120 lb 2 oz (54.5 kg)   SpO2 96%   BMI 20.62 kg/m?  ?Wt Readings from Last 3 Encounters:  ?04/06/22 120 lb 2 oz (54.5 kg)  ?02/10/22 115 lb (52.2 kg)  ?01/04/22 119 lb 9.6 oz (54.3 kg)  ? ? ?   ?Assessment & Plan:  ? ?Problem List Items Addressed This Visit    ?None ?Visit Diagnoses   ? ? Sprain of costal cartilage, initial encounter    -  Primary ? ?reports pain starting a few days ago, has had worsened pain after certain activities. Denies any specific injury, reports moving heavy boxes recently, but did not hurt at the time of lifting, works out at [employer], does yoga, cycles, plays pickleball which can all aggravate the injury. Advised pt to rest, no strenuous activities for the next 1-2 weeks to allow tissues to heal. Apply heat when resting or ice (right after activity) for 20-23mn 3x/day, take 1-2 Aleve bid for the next week only.   ? ?  ? ?HJeanie Sewer NP ? ?

## 2022-04-06 NOTE — Patient Instructions (Addendum)
It was very nice to see you today! ? ?For your rib strain, I recommend using ice for 20-30 minutes several times a day after activity and heat application for same amount of time at other times. ?Continue to take 1-2 Aleve twice a day to help with inflammation and pain for the next week. ?Avoid any strenuous activity that would stress your ribs further:  stretching, twisting, pulling motions. ? ?Let us know if symptoms are not better in 4-8 weeks. ? ? ? ? ?PLEASE NOTE: ? ?If you had any lab tests please let us know if you have not heard back within a few days. You may see your results on MyChart before we have a chance to review them but we will give you a call once they are reviewed by [place]. If we ordered any referrals today, please let us know if you have not heard from their office within the next week.  ? ?Please try these tips to maintain a healthy lifestyle: ? ?Eat most of your calories during the day when you are active. Eliminate processed foods including packaged sweets (pies, cakes, cookies), reduce intake of potatoes, white bread, white pasta, and white rice. Look for whole grain options, oat flour or almond flour. ? ?Each meal should contain half fruits/vegetables, one quarter protein, and one quarter carbs (no bigger than a computer mouse). ? ?Cut down on sweet beverages. This includes juice, soda, and sweet tea. Also watch fruit intake, though this is a healthier sweet option, it still contains natural sugar! Limit to 3 servings daily. ? ?Drink at least 1 glass of water with each meal and aim for at least 8 glasses per day ? ?Exercise at least 150 minutes every week.  ? ?

## 2022-04-13 NOTE — Progress Notes (Signed)
?Allison [name] D.O. ?[facility] ?[place] ?Phone: [phone] ?Subjective:   ?I, Allison [name], am serving as a scribe for Dr. Hulan Saas. ? ?This visit occurred during the SARS-CoV-2 public health emergency.  Safety protocols were in place, including screening questions prior to the visit, additional usage of staff PPE, and extensive cleaning of exam room while observing appropriate contact time as indicated for disinfecting solutions.  ? ?I'm seeing this patient by the request  of:  [name], MD ? ?CC: Back and neck pain follow-up ? ?[id number]  ?Allison [name] is a 71 y.o. female coming in with complaint of back and neck pain. OMT 02/10/2022. Patient states that last week she had some anterior rib pain after taking a spin class last Saturday. Also notes being stressed from a move and is lifting a lot of boxes. Pain can radiate into the thoracic spine. Has been able to swim and do yoga. Cat pose increases her pain as well as getting up out of bed. Saw PCP for issue. Pain has improved but is still not 100%.  ? ?Medications patient has been prescribed: None ? ?Taking: ? ? ?  ? ? ? ? ?Reviewed prior external information including notes and imaging from previsou exam, outside providers and external EMR if available.  ? ?As well as notes that were available from care everywhere and other healthcare systems. ? ?Past medical history, social, surgical and family history all reviewed in electronic medical record.  No pertanent information unless stated regarding to the chief complaint.  ? ?Past Medical History:  ?Diagnosis Date  ? Cancer [facility])   ? Chicken pox   ? Depression   ? Diverticulitis   ? Lumbar herniated disc   ? 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 DEXA 02/2018 Osteopenia T = -1.9 femur, T = -2.2  (T1 = -3.2, T2= -2.5) at lumbar spine; was started on fosamax at that time. DEXA 03/2020 Osteoporosis T = -2.5 at L1-2, lowest, femurs osteopenia, on  fosamax. Continue fosamax.   ? Personal history of chemotherapy   ? Personal history of radiation therapy   ? PONV (postoperative nausea and vomiting)   ?  ?Allergies  ?Allergen Reactions  ? Penicillins   ?  Childhood allergy  ? Sulfa Antibiotics Hives and Swelling  ? ? ? ?Review of Systems: ? No headache, visual changes, nausea, vomiting, diarrhea, constipation, dizziness, abdominal pain, skin rash, fevers, chills, night sweats, weight loss, swollen lymph nodes, body aches, joint swelling, chest pain, shortness of breath, mood changes. POSITIVE muscle aches ? ?Objective  ?Blood pressure 120/86, pulse 69, height '5\' 4"'$  (1.626 m), weight 117 lb (53.1 kg), SpO2 99 %. ?  ?General: No apparent distress alert and oriented x3 mood and affect normal, dressed appropriately.  ?HEENT: Pupils equal, extraocular movements intact  ?Respiratory: Patient's speak in full sentences and does not appear short of breath  ?Cardiovascular: No lower extremity edema, non tender, no erythema  ?Patient does have tightness noted in the parascapular areas bilaterally.  No midline tenderness noted.  Patient does have diffuse mild discomfort noted of the abdominal area.  Patient noted does have more discomfort noted in the epigastric region laterally.  No palpable mass noted.  Neck exam does have some mild loss of lordosis. ? ?Osteopathic findings ? ?C2 flexed rotated and side bent right ?C6 flexed rotated and side bent left ?T3 extended rotated and side bent right inhaled rib ?T7 extended rotated and side bent  left inhaled rib ?L2 flexed rotated and side bent right ?Sacrum right on right ? ? ? ? ?  ?Assessment and Plan: ? ?Lumbar herniated disc ?History of lumbar radiculopathy.  Patient has responded well though to osteopathic manipulation.  Does have a past medical history significant for breast cancer.  X-rays of the thoracic spine do show the patient may have had a compression fracture that likely is old and does appear to be stable.  Do not  feel that advanced imaging is warranted at the moment.  We will continue to monitor.  Discussed continuing the gabapentin.  Patient has had meloxicam but unfortunately did not seem to tolerate it as well.  Follow-up with me again in 4 to 6 weeks.  History of osteoporosis and is scheduled for another bone density at the beginning of June. ? ?Abdominal pain, chronic, epigastric ?Patient is having pain that seems to be more in the upper abdominal epigastric region that radiates bilaterally.  Seems worse with movement.  Could be muscular in nature.  Seems to be going on for quite some time with no true injury.  Patient is able to remain active but does state that certain movements cause more discomfort.  Patient did have a chest x-ray done.  Patient's aorta may be on the upper limit and will get an abdominal ultrasound to further evaluate.  May need to consider the possibility of advanced imaging if this continues.  Patient knows if worsening symptoms to seek medical attention immediately.  Patient is in agreement with the plan.  ? ?Nonallopathic problems ? ?Decision today to treat with OMT was based on Physical Exam ? ?After verbal consent patient was treated with  ME, FPR techniques in cervical, rib, thoracic, lumbar, and sacral  areas ? ?Patient tolerated the procedure well with improvement in symptoms ? ?Patient given exercises, stretches and lifestyle modifications ? ?See medications in patient instructions if given ? ?Patient will follow up in 4-8 weeks ? ?  ? ? ?The above documentation has been reviewed and is accurate and complete Lyndal Pulley, DO ? ? ? ?  ? ? Note: This dictation was prepared with Dragon dictation along with smaller phrase technology. Any transcriptional errors that result from this process are unintentional.    ?  ?  ? ?

## 2022-04-14 ENCOUNTER — Ambulatory Visit (INDEPENDENT_AMBULATORY_CARE_PROVIDER_SITE_OTHER): Payer: Medicare Other | Admitting: Family Medicine

## 2022-04-14 ENCOUNTER — Ambulatory Visit (INDEPENDENT_AMBULATORY_CARE_PROVIDER_SITE_OTHER): Payer: Medicare Other

## 2022-04-14 ENCOUNTER — Encounter: Payer: Self-pay | Admitting: Family Medicine

## 2022-04-14 VITALS — BP 120/86 | HR 69 | Ht 64.0 in | Wt 117.0 lb

## 2022-04-14 DIAGNOSIS — M546 Pain in thoracic spine: Secondary | ICD-10-CM

## 2022-04-14 DIAGNOSIS — M5126 Other intervertebral disc displacement, lumbar region: Secondary | ICD-10-CM | POA: Diagnosis not present

## 2022-04-14 DIAGNOSIS — R1013 Epigastric pain: Secondary | ICD-10-CM

## 2022-04-14 DIAGNOSIS — G8929 Other chronic pain: Secondary | ICD-10-CM

## 2022-04-14 DIAGNOSIS — M9902 Segmental and somatic dysfunction of thoracic region: Secondary | ICD-10-CM | POA: Diagnosis not present

## 2022-04-14 DIAGNOSIS — R079 Chest pain, unspecified: Secondary | ICD-10-CM | POA: Diagnosis not present

## 2022-04-14 DIAGNOSIS — R1084 Generalized abdominal pain: Secondary | ICD-10-CM | POA: Diagnosis not present

## 2022-04-14 DIAGNOSIS — M9901 Segmental and somatic dysfunction of cervical region: Secondary | ICD-10-CM

## 2022-04-14 DIAGNOSIS — M9908 Segmental and somatic dysfunction of rib cage: Secondary | ICD-10-CM | POA: Diagnosis not present

## 2022-04-14 DIAGNOSIS — M9904 Segmental and somatic dysfunction of sacral region: Secondary | ICD-10-CM

## 2022-04-14 DIAGNOSIS — M9903 Segmental and somatic dysfunction of lumbar region: Secondary | ICD-10-CM | POA: Diagnosis not present

## 2022-04-14 NOTE — Assessment & Plan Note (Signed)
Patient is having pain that seems to be more in the upper abdominal epigastric region that radiates bilaterally.  Seems worse with movement.  Could be muscular in nature.  Seems to be going on for quite some time with no true injury.  Patient is able to remain active but does state that certain movements cause more discomfort.  Patient did have a chest x-ray done.  Patient's aorta may be on the upper limit and will get an abdominal ultrasound to further evaluate.  May need to consider the possibility of advanced imaging if this continues.  Patient knows if worsening symptoms to seek medical attention immediately.  Patient is in agreement with the plan. ?

## 2022-04-14 NOTE — Patient Instructions (Addendum)
US Abdomen [phone] ?Stay active ?If pain gets worse seek medical attention ?See me again in 5-6 weeks ? ? ?

## 2022-04-14 NOTE — Assessment & Plan Note (Signed)
History of lumbar radiculopathy.  Patient has responded well though to osteopathic manipulation.  Does have a past medical history significant for breast cancer.  X-rays of the thoracic spine do show the patient may have had a compression fracture that likely is old and does appear to be stable.  Do not feel that advanced imaging is warranted at the moment.  We will continue to monitor.  Discussed continuing the gabapentin.  Patient has had meloxicam but unfortunately did not seem to tolerate it as well.  Follow-up with me again in 4 to 6 weeks.  History of osteoporosis and is scheduled for another bone density at the beginning of June. ?

## 2022-04-16 ENCOUNTER — Ambulatory Visit
Admission: RE | Admit: 2022-04-16 | Discharge: 2022-04-16 | Disposition: A | Discharge status: Home / Self Care | Payer: Medicare Other | Source: Ambulatory Visit | Attending: Family Medicine | Admitting: Family Medicine

## 2022-04-16 DIAGNOSIS — N281 Cyst of kidney, acquired: Secondary | ICD-10-CM | POA: Diagnosis not present

## 2022-04-16 DIAGNOSIS — R1084 Generalized abdominal pain: Secondary | ICD-10-CM

## 2022-04-18 ENCOUNTER — Encounter: Payer: Self-pay | Admitting: Family Medicine

## 2022-04-20 ENCOUNTER — Ambulatory Visit: Payer: Medicare Other | Admitting: Family Medicine

## 2022-04-27 DIAGNOSIS — Z85828 Personal history of other malignant neoplasm of skin: Secondary | ICD-10-CM | POA: Diagnosis not present

## 2022-04-27 DIAGNOSIS — D225 Melanocytic nevi of trunk: Secondary | ICD-10-CM | POA: Diagnosis not present

## 2022-04-27 DIAGNOSIS — L821 Other seborrheic keratosis: Secondary | ICD-10-CM | POA: Diagnosis not present

## 2022-04-30 ENCOUNTER — Other Ambulatory Visit: Payer: Self-pay | Admitting: Family Medicine

## 2022-05-10 ENCOUNTER — Ambulatory Visit (INDEPENDENT_AMBULATORY_CARE_PROVIDER_SITE_OTHER): Payer: Medicare Other

## 2022-05-10 DIAGNOSIS — Z0000 Encounter for general adult medical examination without abnormal findings: Secondary | ICD-10-CM

## 2022-05-10 NOTE — Progress Notes (Addendum)
? ?Subjective:  ? Allison [name] is a 71 y.o. female who presents for Medicare Annual (Subsequent) preventive examination. ? ?Review of Systems    ? ?Cardiac Risk Factors include: advanced age (>19mn, >>31women) ? ?   ?Objective:  ?  ?Today's Vitals  ? 05/10/22 1016  ?PainSc: 0-No pain  ? ?There is no height or weight on file to calculate BMI. ? ? ?  05/10/2022  ? 10:23 AM 05/04/2021  ? 10:32 AM 05/06/2020  ? 10:42 AM 05/01/2020  ? 12:04 PM 04/15/2020  ? 10:22 AM  ?Advanced Directives  ?Does Patient Have a Medical Advance Directive? Yes Yes Yes Yes Yes  ?Type of [place]amed[place]of [place] Healthcare Power of [place]  Living will;Healthcare Power of Attorney  ?Does patient want to make changes to medical advance directive?   No - Patient declined No - Patient declined No - Patient declined  ?Copy of [place] Chart? No - copy requested No - copy requested No - copy requested No - copy requested No - copy requested  ? ? ?Current Medications (verified) ?Outpatient Encounter Medications as of 05/10/2022  ?Medication Sig  ? alendronate (FOSAMAX) 70 MG tablet TAKE 1 TABLET WEEKLY ON AN EMPTY STOMACH WITH A FULL  GLASS OF WATER.  ? Calcium Citrate-Vitamin D (CALCIUM + D PO) Take by mouth.  ? diclofenac sodium (VOLTAREN) 1 % GEL Apply 2 grams topically to affected area qid  ? gabapentin (NEURONTIN) 100 MG capsule TAKE 2 CAPSULES BY MOUTH AT BEDTIME  ? MAGNESIUM PO Take by mouth.  ? NIACIN PO Take by mouth.  ? NON FORMULARY Tart cherry extract  ? sertraline (ZOLOFT) 25 MG tablet Take 1 tablet (25 mg total) by mouth daily.  ? Turmeric 500 MG TABS Take by mouth.  ? meloxicam (MOBIC) 15 MG tablet Take 1 tablet (15 mg total) by mouth as needed.  ? Vitamin D, Cholecalciferol, 10 MCG (400 UNIT) TABS SMARTSIG:1 Tablet(s) By Mouth  ? [DISCONTINUED] Biotin 1 MG CAPS Take by mouth.  ? [DISCONTINUED] clindamycin (CLEOCIN) 150 MG capsule Take  150 mg by mouth 3 (three) times daily.  ? ?No facility-administered encounter medications on file as of 05/10/2022.  ? ? ?Allergies (verified) ?Penicillins and Sulfa antibiotics  ? ?History: ?Past Medical History:  ?Diagnosis Date  ? [facility]   ? Chicken pox   ? Depression   ? Diverticulitis   ? Lumbar herniated disc   ? 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 DEXA 02/2018 Osteopenia T = -1.9 femur, T = -2.2  (T1 = -3.2, T2= -2.5) at lumbar spine; was started on fosamax at that time. DEXA 03/2020 Osteoporosis T = -2.5 at L1-2, lowest, femurs osteopenia, on fosamax. Continue fosamax.   ? Personal history of chemotherapy   ? Personal history of radiation therapy   ? PONV (postoperative nausea and vomiting)   ? ?Past Surgical History:  ?Procedure Laterality Date  ? ABDOMINAL HYSTERECTOMY    ? BREAST BIOPSY Left 1999  ? BREAST LUMPECTOMY Left 2000  ? OPEN REDUCTION INTERNAL FIXATION (ORIF) METACARPAL Left 05/06/2020  ? Procedure: OPEN REDUCTION INTERNAL FIXATION (ORIF) LEFT THUMB METACARPAL FRACTURE;  Surgeon: KLeanora Cover MD;  [facility]  Service: Orthopedics;  Laterality: Left;  block in preop  ? TONSILECTOMY/ADENOIDECTOMY WITH MYRINGOTOMY    ? TONSILLECTOMY    ? ?Family History  ?Problem Relation Age of Onset  ? Alcohol abuse Mother   ?  Cancer Mother   ? Alcohol abuse Father   ? Depression Father   ? Early death Father   ? Alcohol abuse Brother   ? Cancer Brother   ? Breast cancer Neg Hx   ? ?Social History  ? ?Socioeconomic History  ? Marital status: Married  ?  Spouse name: Not on file  ? Number of children: 0  ? Years of education: Not on file  ? Highest education level: Not on file  ?Occupational History  ? Occupation: Retired   ?  Comment: professor @ [employer]  ?Tobacco Use  ? Smoking status: Former  ?  Types: Cigarettes  ?  Quit date: 12/27/1972  ?  Years since quitting: 49.4  ? Smokeless tobacco: Never  ?Vaping Use  ? Vaping Use: Never used  ?Substance and  Sexual Activity  ? Alcohol use: Yes  ?  Comment: socially  ? Drug use: Never  ? Sexual activity: Not on file  ?Other Topics Concern  ? Not on file  ?Social History Narrative  ? Pets:  2 cats adopted at the start of Covid   ? ?Social Determinants of Health  ? ?Financial Resource Strain: Low Risk   ? Difficulty of Paying Living Expenses: Not hard at all  ?Food Insecurity: No Food Insecurity  ? Worried About Charity fundraiser in the Last Year: Never true  ? Ran Out of Food in the Last Year: Never true  ?Transportation Needs: No Transportation Needs  ? Lack of Transportation (Medical): No  ? Lack of Transportation (Non-Medical): No  ?Physical Activity: Sufficiently Active  ? Days of Exercise per Week: 6 days  ? Minutes of Exercise per Session: 90 min  ?Stress: No Stress Concern Present  ? Feeling of Stress : Not at all  ?Social Connections: Moderately Integrated  ? Frequency of Communication with Friends and Family: Three times a week  ? Frequency of Social Gatherings with Friends and Family: More than three times a week  ? Attends Religious Services: Never  ? Active Member of Clubs or Organizations: Yes  ? Attends Archivist Meetings: More than 4 times per year  ? Marital Status: Married  ? ? ?Tobacco Counseling ?Counseling given: Not Answered ? ? ?Clinical Intake: ? ?Pre-visit preparation completed: Yes ? ?Pain : No/denies pain ?Pain Score: 0-No pain ? ?  ? ?BMI - recorded: 20.08 ?Nutritional Status: BMI of 19-24  Normal ?Nutritional Risks: None ?Diabetes: No ? ?How often do you need to have someone help you when you read instructions, pamphlets, or other written materials from your doctor or pharmacy?: 1 - Never ? ?Diabetic?no ? ?Interpreter Needed?: No ? ?Information entered by :: Charlott Rakes, LPN ? ? ?Activities of Daily Living ? ?  05/10/2022  ? 10:25 AM  ?In your present state of health, do you have any difficulty performing the following activities:  ?Hearing? 1  ?Comment wears hearing aids   ?Vision? 0  ?Difficulty concentrating or making decisions? 0  ?Walking or climbing stairs? 0  ?Dressing or bathing? 0  ?Doing errands, shopping? 0  ?Preparing Food and eating ? N  ?Using the Toilet? N  ?In the past six months, have you accidently leaked urine? N  ?Do you have problems with loss of bowel control? N  ?Managing your Medications? N  ?Managing your Finances? N  ?Housekeeping or managing your Housekeeping? N  ? ? ?Patient Care Team: ?[name], MD as PCP - General (Family Medicine) ?H[place], Jennefer Bravo, MD  as Referring Physician (Dermatology) ?Jola Schmidt, MD as Consulting Physician (Ophthalmology) ?Dr. Marrianne Mood, DDS as Consulting Physician (Dentistry) ?Lonzo Candy, AUD as Consulting Physician (Audiology) ?Lyndal Pulley, DO as Consulting Physician (Sports Medicine) ? ?Indicate any recent Medical Services you may have received from other than [facility] providers in the past year (date may be approximate). ? ?   ?Assessment:  ? This is a routine wellness examination for [place]. ? ?Hearing/Vision screen ?Hearing Screening - Comments:: Pt wears hearing aids  ?Vision Screening - Comments:: Pt follows up with Dr Valetta Close for annual eye exams  ? ?Dietary issues and exercise activities discussed: ?Current Exercise Habits: Home exercise routine, Type of exercise: walking, Time (Minutes): > 60, Frequency (Times/Week): 6, Weekly Exercise (Minutes/Week): 0 ? ? Goals Addressed   ? ?  ?  ?  ?  ? This Visit's Progress  ?  Patient Stated     ?  Stay healthy and active  ?  ? ?  ? ?Depression Screen ? ?  05/10/2022  ? 10:22 AM 04/06/2022  ? 10:01 AM 01/04/2022  ?  1:26 PM 05/04/2021  ? 10:31 AM 09/04/2020  ?  8:32 AM 04/15/2020  ? 10:23 AM 12/19/2019  ?  9:10 AM  ?PHQ 2/9 Scores  ?PHQ - 2 Score 0 0 0 0 0 0 0  ?PHQ- 9 Score  0 3  0    ?  ?Fall Risk ? ?  05/10/2022  ? 10:25 AM 05/04/2021  ? 10:33 AM 09/04/2020  ?  8:29 AM 04/15/2020  ? 10:23 AM 04/01/2020  ?  8:10 AM  ?Fall Risk   ?Falls in the past year? 0 0 1 0 0  ?Number  falls in past yr: 0 0 0 0 0  ?Injury with Fall? 0 0 1 0 0  ?Risk for fall due to : Impaired vision Impaired vision     ?Follow up Falls prevention discussed Falls prevention discussed  Falls evaluation compl

## 2022-05-10 NOTE — Patient Instructions (Signed)
Allison [name] , ?Thank you for taking time to come for your Medicare Wellness Visit. I appreciate your ongoing commitment to your health goals. Please review the following plan we discussed and let me know if I can assist you in the future.  ? ?Screening recommendations/referrals: ?Colonoscopy: Done 12/08/15 repeat every 10 years  ?Mammogram: Done 02/03/22 repeat every year  ?Bone Density: scheduled 06/02/22  ?Recommended yearly ophthalmology/optometry visit for glaucoma screening and checkup ?Recommended yearly dental visit for hygiene and checkup ? ?Vaccinations: ?Influenza vaccine: Done 10/07/21 repeat every year  ?Pneumococcal vaccine: Up to date ?Tdap vaccine: Done 02/11/16 repeat every 10 years  ?Shingles vaccine: Completed 1/11, 05/08/19    ?Covid-19:Completed 1/19, 2/9, 09/30/20 & 04/22/21 ? ?Advanced directives: Please bring a copy of your health care power of attorney and living will to the office at your convenience. ? ?Conditions/risks identified: Stay healthy and active  ? ?Next appointment: Follow up in one year for your annual wellness visit  ? ? ?Preventive Care 28 Years and Older, Female ?Preventive care refers to lifestyle choices and visits with your health care provider that can promote health and wellness. ?What does preventive care include? ?A yearly physical exam. This is also called an annual well check. ?Dental exams once or twice a year. ?Routine eye exams. Ask your health care provider how often you should have your eyes checked. ?Personal lifestyle choices, including: ?Daily care of your teeth and gums. ?Regular physical activity. ?Eating a healthy diet. ?Avoiding tobacco and drug use. ?Limiting alcohol use. ?Practicing safe sex. ?Taking low-dose aspirin every day. ?Taking vitamin and mineral supplements as recommended by your health care provider. ?What happens during an annual well check? ?The services and screenings done by your health care provider during your annual well check will depend on  your age, overall health, lifestyle risk factors, and family history of disease. ?Counseling  ?Your health care provider may ask you questions about your: ?Alcohol use. ?Tobacco use. ?Drug use. ?Emotional well-being. ?Home and relationship well-being. ?Sexual activity. ?Eating habits. ?History of falls. ?Memory and ability to understand (cognition). ?Work and work Statistician. ?Reproductive health. ?Screening  ?You may have the following tests or measurements: ?Height, weight, and BMI. ?Blood pressure. ?Lipid and cholesterol levels. These may be checked every 5 years, or more frequently if you are over 56 years old. ?Skin check. ?Lung cancer screening. You may have this screening every year starting at age 26 if you have a 30-pack-year history of smoking and currently smoke or have quit within the past 15 years. ?Fecal occult blood test (FOBT) of the stool. You may have this test every year starting at age 54. ?Flexible sigmoidoscopy or colonoscopy. You may have a sigmoidoscopy every 5 years or a colonoscopy every 10 years starting at age 57. ?Hepatitis C blood test. ?Hepatitis B blood test. ?Sexually transmitted disease (STD) testing. ?Diabetes screening. This is done by checking your blood sugar (glucose) after you have not eaten for a while (fasting). You may have this done every 1-3 years. ?Bone density scan. This is done to screen for osteoporosis. You may have this done starting at age 47. ?Mammogram. This may be done every 1-2 years. Talk to your health care provider about how often you should have regular mammograms. ?Talk with your health care provider about your test results, treatment options, and if necessary, the need for more tests. ?Vaccines  ?Your health care provider may recommend certain vaccines, such as: ?Influenza vaccine. This is recommended every year. ?Tetanus, diphtheria, and acellular  pertussis (Tdap, Td) vaccine. You may need a Td booster every 10 years. ?Zoster vaccine. You may need this  after age 50. ?Pneumococcal 13-valent conjugate (PCV13) vaccine. One dose is recommended after age 67. ?Pneumococcal polysaccharide (PPSV23) vaccine. One dose is recommended after age 32. ?Talk to your health care provider about which screenings and vaccines you need and how often you need them. ?This information is not intended to replace advice given to you by your health care provider. Make sure you discuss any questions you have with your health care provider. ?Document Released: 01/09/2016 Document Revised: 09/01/2016 Document Reviewed: 10/14/2015 ?Elsevier Interactive Patient Education ? 2017 [place]. ? ?Fall Prevention in the Home ?Falls can cause injuries. They can happen to people of all ages. There are many things you can do to make your home safe and to help prevent falls. ?What can I do on the outside of my home? ?Regularly fix the edges of walkways and driveways and fix any cracks. ?Remove anything that might make you trip as you walk through a door, such as a raised step or threshold. ?Trim any bushes or trees on the path to your home. ?Use bright outdoor lighting. ?Clear any walking paths of anything that might make someone trip, such as rocks or tools. ?Regularly check to see if handrails are loose or broken. Make sure that both sides of any steps have handrails. ?Any raised decks and porches should have guardrails on the edges. ?Have any leaves, snow, or ice cleared regularly. ?Use sand or salt on walking paths during winter. ?Clean up any spills in your garage right away. This includes oil or grease spills. ?What can I do in the bathroom? ?Use night lights. ?Install grab bars by the toilet and in the tub and shower. Do not use towel bars as grab bars. ?Use non-skid mats or decals in the tub or shower. ?If you need to sit down in the shower, use a plastic, non-slip stool. ?Keep the floor dry. Clean up any water that spills on the floor as soon as it happens. ?Remove soap buildup in the tub or  shower regularly. ?Attach bath mats securely with double-sided non-slip rug tape. ?Do not have throw rugs and other things on the floor that can make you trip. ?What can I do in the bedroom? ?Use night lights. ?Make sure that you have a light by your bed that is easy to reach. ?Do not use any sheets or blankets that are too big for your bed. They should not hang down onto the floor. ?Have a firm chair that has side arms. You can use this for support while you get dressed. ?Do not have throw rugs and other things on the floor that can make you trip. ?What can I do in the kitchen? ?Clean up any spills right away. ?Avoid walking on wet floors. ?Keep items that you use a lot in easy-to-reach places. ?If you need to reach something above you, use a strong step stool that has a grab bar. ?Keep electrical cords out of the way. ?Do not use floor polish or wax that makes floors slippery. If you must use wax, use non-skid floor wax. ?Do not have throw rugs and other things on the floor that can make you trip. ?What can I do with my stairs? ?Do not leave any items on the stairs. ?Make sure that there are handrails on both sides of the stairs and use them. Fix handrails that are broken or loose. Make sure that handrails  are as long as the stairways. ?Check any carpeting to make sure that it is firmly attached to the stairs. Fix any carpet that is loose or worn. ?Avoid having throw rugs at the top or bottom of the stairs. If you do have throw rugs, attach them to the floor with carpet tape. ?Make sure that you have a light switch at the top of the stairs and the bottom of the stairs. If you do not have them, ask someone to add them for you. ?What else can I do to help prevent falls? ?Wear shoes that: ?Do not have high heels. ?Have rubber bottoms. ?Are comfortable and fit you well. ?Are closed at the toe. Do not wear sandals. ?If you use a stepladder: ?Make sure that it is fully opened. Do not climb a closed stepladder. ?Make  sure that both sides of the stepladder are locked into place. ?Ask someone to hold it for you, if possible. ?Clearly mark and make sure that you can see: ?Any grab bars or handrails. ?First and last step

## 2022-05-18 NOTE — Progress Notes (Unsigned)
[place] Phone: [phone] Subjective:   [name], am serving as a scribe for Dr. Hulan Saas.   I'm seeing this patient by the request  of:  [name], MD  CC: Back and hip pain  CBJ:[id number]  Eugenia Eldredge is a 71 y.o. female coming in with complaint of back and neck pain. OMT 04/14/2022. Patient states that she has been doing well. Stiffness in hips noted today. Rib pain has subsided.  Patient was prescribed.  Patient reports he is doing much better.  Patient is somewhat concerned because it continues to to potentially be more frequent.  Medications patient has been prescribed: None  Taking:         Reviewed prior external information including notes and imaging from previsou exam, outside providers and external EMR if available.   As well as notes that were available from care everywhere and other healthcare systems.  Past medical history, social, surgical and family history all reviewed in electronic medical record.  No pertanent information unless stated regarding to the chief complaint.   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 DEXA 03/2020 Osteoporosis T = -2.5 at L1-2, lowest, femurs osteopenia, on fosamax. Continue fosamax.    Personal history of chemotherapy    Personal history of radiation therapy    PONV (postoperative nausea and vomiting)     Allergies  Allergen Reactions   Penicillins     Childhood allergy   Sulfa Antibiotics Hives and Swelling     Review of Systems:  No headache, visual changes, nausea, vomiting, diarrhea, constipation, dizziness, abdominal pain, skin rash, fevers, chills, night sweats,  weight loss, swollen lymph nodes, body aches, joint swelling, chest pain, shortness of breath, mood changes. POSITIVE muscle aches  Objective  Blood pressure 100/70, pulse 66, height '5\' 4"'$  (1.626 m), weight 115 lb (52.2 kg), SpO2 98 %.   General: No apparent distress alert and oriented x3 mood and affect normal, dressed appropriately.  HEENT: Pupils equal, extraocular movements intact  Respiratory: Patient's speak in full sentences and does not appear short of breath  Cardiovascular: No lower extremity edema, non tender, no erythema  Low back exam does have some loss of lordosis with some mild degenerative scoliosis.  Surgery.  No weakness in the legs.  Test. Osteopathic findings  C2 flexed rotated and side bent right C6 flexed rotated and side bent left T3 extended rotated and side bent right inhaled rib T9 extended rotated and side bent left L2 flexed rotated and side bent right Sacrum right on right       Assessment and Plan:  Osteoporosis of lumbar spine Continues to have intermittent pain overall.  Is on Fosamax.  Is responding relatively well though to osteopathic manipulation.  Doing more of the muscle energy and mild HVLA.  Patient has the gabapentin and is responding that at night.  Discussed home exercises and icing regimen and follow-up with me again in 6 to 8 weeks  Abdominal pain, chronic, epigastric Patient's pain is completely resolved.  Discussed continuing to monitor for issues and see if there is any other  specific triggers that could be potentially contributing.  Follow-up again in 6 to 8 weeks.   Nonallopathic problems  Decision today to treat with OMT was based on Physical Exam  After verbal consent patient was treated with HVLA, ME, FPR techniques in cervical, rib, thoracic, lumbar, and sacral  areas  Patient tolerated the procedure well with improvement in symptoms  Patient given exercises, stretches and lifestyle modifications  See medications in  patient instructions if given  Patient will follow up in 4-8 weeks     The above documentation has been reviewed and is accurate and complete Lyndal Pulley, DO        Note: This dictation was prepared with Dragon dictation along with smaller phrase technology. Any transcriptional errors that result from this process are unintentional.

## 2022-05-19 ENCOUNTER — Ambulatory Visit (INDEPENDENT_AMBULATORY_CARE_PROVIDER_SITE_OTHER): Payer: Medicare Other | Admitting: Family Medicine

## 2022-05-19 VITALS — BP 100/70 | HR 66 | Ht 64.0 in | Wt 115.0 lb

## 2022-05-19 DIAGNOSIS — M9901 Segmental and somatic dysfunction of cervical region: Secondary | ICD-10-CM

## 2022-05-19 DIAGNOSIS — M9903 Segmental and somatic dysfunction of lumbar region: Secondary | ICD-10-CM

## 2022-05-19 DIAGNOSIS — M9902 Segmental and somatic dysfunction of thoracic region: Secondary | ICD-10-CM | POA: Diagnosis not present

## 2022-05-19 DIAGNOSIS — G8929 Other chronic pain: Secondary | ICD-10-CM | POA: Diagnosis not present

## 2022-05-19 DIAGNOSIS — M810 Age-related osteoporosis without current pathological fracture: Secondary | ICD-10-CM

## 2022-05-19 DIAGNOSIS — M9908 Segmental and somatic dysfunction of rib cage: Secondary | ICD-10-CM

## 2022-05-19 DIAGNOSIS — R1013 Epigastric pain: Secondary | ICD-10-CM

## 2022-05-19 DIAGNOSIS — M9904 Segmental and somatic dysfunction of sacral region: Secondary | ICD-10-CM | POA: Diagnosis not present

## 2022-05-19 MED ORDER — MELOXICAM 15 MG PO TABS: 15.0000 mg | ORAL_TABLET | Freq: Every day | ORAL | 0 refills | Status: DC

## 2022-05-19 NOTE — Patient Instructions (Signed)
Good to see you Glad you are better Refilled meloxicam Can take meloxicam in 5 day bursts when needed See me in 6-8 weeks

## 2022-05-19 NOTE — Assessment & Plan Note (Signed)
Continues to have intermittent pain overall.  Is on Fosamax.  Is responding relatively well though to osteopathic manipulation.  Doing more of the muscle energy and mild HVLA.  Patient has the gabapentin and is responding that at night.  Discussed home exercises and icing regimen and follow-up with me again in 6 to 8 weeks

## 2022-05-19 NOTE — Assessment & Plan Note (Signed)
Patient's pain is completely resolved.  Discussed continuing to monitor for issues and see if there is any other specific triggers that could be potentially contributing.  Follow-up again in 6 to 8 weeks.

## 2022-06-02 ENCOUNTER — Ambulatory Visit
Admission: RE | Admit: 2022-06-02 | Discharge: 2022-06-02 | Disposition: A | Discharge status: Home / Self Care | Payer: Medicare Other | Source: Ambulatory Visit | Attending: Family Medicine | Admitting: Family Medicine

## 2022-06-02 DIAGNOSIS — M8589 Other specified disorders of bone density and structure, multiple sites: Secondary | ICD-10-CM | POA: Diagnosis not present

## 2022-06-02 DIAGNOSIS — M810 Age-related osteoporosis without current pathological fracture: Secondary | ICD-10-CM

## 2022-06-02 DIAGNOSIS — Z780 Asymptomatic menopausal state: Secondary | ICD-10-CM | POA: Diagnosis not present

## 2022-06-10 ENCOUNTER — Encounter: Payer: Self-pay | Admitting: Family Medicine

## 2022-06-10 ENCOUNTER — Telehealth (INDEPENDENT_AMBULATORY_CARE_PROVIDER_SITE_OTHER): Payer: Medicare Other | Admitting: Family Medicine

## 2022-06-10 VITALS — Temp 99.0°F | Wt 115.0 lb

## 2022-06-10 DIAGNOSIS — R197 Diarrhea, unspecified: Secondary | ICD-10-CM | POA: Diagnosis not present

## 2022-06-10 MED ORDER — ONDANSETRON HCL 4 MG PO TABS: 4.0000 mg | ORAL_TABLET | Freq: Three times a day (TID) | ORAL | 0 refills | Status: DC | PRN

## 2022-06-10 NOTE — Progress Notes (Signed)
Virtual Visit via Video Note  I connected with Braylinn  on 06/10/22 at 10:00 AM EDT by a video enabled telemedicine application and verified that I am speaking with the correct person using two identifiers.  Location patient: [place] Location provider:work or home office Persons participating in the virtual visit: patient, provider  I discussed the limitations and requested verbal permission for telemedicine visit. The patient expressed understanding and agreed to proceed.   HPI:  Acute telemedicine visit for diarrhea: -Onset: 2 days ago -Symptoms include: some diarrhea, felt a little achy initially, watery diarrhea twice today, nausea -Denies:fever (temp 99 today), resp symptoms, vomiting, blood in the stool, CP, SOB, abd pain -is able to drink fluids and had yogurt for breakfast, melena -traveled to resort in [place] recently - did not leave resort, did not consume any raw foods, denies recent abx use, known sick contacts -covid test negative today -Has tried:nothing -Pertinent past medical history: see below -Pertinent medication allergies: Allergies  Allergen Reactions   Penicillins     Childhood allergy   Sulfa Antibiotics Hives and Swelling   -COVID-19 vaccine status:  Immunization History  Administered Date(s) Administered   Fluad Quad(high Dose 65+) 09/04/2019, 09/04/2020, 10/07/2021   Influenza-Unspecified 02/07/2015, 09/02/2018   PFIZER(Purple Top)SARS-COV-2 Vaccination 01/15/2020, 02/05/2020, 09/30/2020   PNEUMOCOCCAL CONJUGATE-20 01/04/2022   Pfizer Covid-19 Vaccine Bivalent Booster 16yr & up 04/22/2021   Pneumococcal-Unspecified 03/15/2018   Tdap 02/11/2016   Zoster Recombinat (Shingrix) 09/07/2011, 01/06/2019, 05/08/2019     ROS: See pertinent positives and negatives per HPI.  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 DEXA 03/2020 Osteoporosis T = -2.5 at L1-2, lowest, femurs osteopenia, on fosamax. Continue fosamax.    Personal history of chemotherapy    Personal history of radiation therapy    PONV (postoperative nausea and vomiting)     Past Surgical History:  Procedure Laterality Date   ABDOMINAL HYSTERECTOMY     BREAST BIOPSY Left 1999   BREAST LUMPECTOMY Left 2000   OPEN REDUCTION INTERNAL FIXATION (ORIF) METACARPAL Left 05/06/2020   Procedure: OPEN REDUCTION INTERNAL FIXATION (ORIF) LEFT THUMB METACARPAL FRACTURE;  Surgeon: KLeanora Cover MD;  Location[facility]  Service: Orthopedics;  Laterality: Left;  block in preop   TONSILECTOMY/ADENOIDECTOMY WITH MYRINGOTOMY     TONSILLECTOMY       Current Outpatient Medications:    alendronate (FOSAMAX) 70 MG tablet, TAKE 1 TABLET WEEKLY ON AN EMPTY STOMACH WITH A FULL  GLASS OF WATER., Disp: 12 tablet, Rfl: 3   Calcium Citrate-Vitamin D (CALCIUM + D PO), Take by mouth., Disp: , Rfl:    diclofenac sodium (VOLTAREN) 1 % GEL, Apply 2 grams topically to affected area qid, Disp: 100 g, Rfl: 1   gabapentin (NEURONTIN) 100 MG capsule, TAKE 2 CAPSULES BY MOUTH AT BEDTIME, Disp: 180 capsule, Rfl: 3   MAGNESIUM PO, Take by mouth., Disp: , Rfl:    meloxicam (MOBIC) 15 MG tablet, Take 1 tablet (15 mg total) by mouth daily., Disp: 30 tablet, Rfl: 0   NIACIN PO, Take by mouth., Disp: , Rfl:    NON FORMULARY, Tart cherry extract, Disp: , Rfl:    ondansetron (  ZOFRAN) 4 MG tablet, Take 1 tablet (4 mg total) by mouth every 8 (eight) hours as needed for nausea or vomiting., Disp: 10 tablet, Rfl: 0   sertraline (ZOLOFT) 25 MG tablet, Take 1 tablet (25 mg total) by mouth daily., Disp: 90 tablet, Rfl: 3   Turmeric 500 MG TABS, Take by mouth., Disp: , Rfl:    Vitamin D, Cholecalciferol, 10 MCG (400 UNIT) TABS, SMARTSIG:1 Tablet(s) By Mouth, Disp: ,  Rfl:   EXAM:  VITALS per patient if applicable:  GENERAL: alert, oriented, appears well and in no acute distress  HEENT: atraumatic, conjunttiva clear, no obvious abnormalities on inspection of external nose and ears  NECK: normal movements of the head and neck  LUNGS: on inspection no signs of respiratory distress, breathing rate appears normal, no obvious gross SOB, gasping or wheezing  CV: no obvious cyanosis  MS: moves all visible extremities without noticeable abnormality  PSYCH/NEURO: pleasant and cooperative, no obvious depression or anxiety, speech and thought processing grossly intact  ASSESSMENT AND PLAN:  Discussed the following assessment and plan:  Diarrhea, unspecified type  -we discussed possible serious and likely etiologies, options for evaluation and workup, limitations of telemedicine visit vs in person visit, treatment, treatment risks and precautions. Pt is agreeable to treatment via telemedicine at this moment. Possible viral gastroenteritis vs other. She has opted for oral hydration, imodium if needed for diarrhea, zofran rx for nausea if needed and advised to seek prompt virtual visit or in person care if worsening, new symptoms arise, or if is not improving with treatment as expected per our conversation of expected course. Discussed options for follow up care. Did let this patient know that I do telemedicine on Tuesdays and Thursdays for [facility] and those are the days I am logged into the system. Advised to schedule follow up visit with PCP, [facility] [facility] virtual visits or [facility] if any further questions or concerns to avoid delays in care. She has opted to retest for covid in 2 days, and is aware can contact [facility] if positive and desires treatment.    I discussed the assessment and treatment plan with the patient. The patient was provided an opportunity to ask questions and all were answered. The patient agreed with the plan and demonstrated an  understanding of the instructions.     Lucretia Kern, DO

## 2022-06-10 NOTE — Patient Instructions (Signed)
-  I sent the medication(s) we discussed to your pharmacy: Meds ordered this encounter  Medications   ondansetron (ZOFRAN) 4 MG tablet    Sig: Take 1 tablet (4 mg total) by mouth every 8 (eight) hours as needed for nausea or vomiting.    Dispense:  10 tablet    Refill:  0   Drink plenty of fluids (with electrolytes as we discussed if not eating.)  Avoid dairy products.  Imodium if needed if further diarrhea.   I hope you are feeling better soon!  Seek in person care promptly if your symptoms worsen, blood in stool, abdominal pain, fevers > 100, can not tolerated fluid intake, feeling weak, new concerns arise or you are not improving with treatment.  It was nice to meet you today. I help [facility] out with telemedicine visits on Tuesdays and Thursdays and am happy to help if you need a virtual follow up visit on those days. Otherwise, if you have any concerns or questions following this visit please schedule a follow up visit with your Primary Care office or seek care at a local urgent care clinic to avoid delays in care. If you are having severe or life threatening symptoms please call 911 and/or go to the nearest emergency room.

## 2022-06-25 NOTE — Progress Notes (Signed)
[place] [address] [place] Phone: [phone] Subjective:   IVilma Meckel, am serving as a scribe for Dr. Hulan Saas.  I'm seeing this patient by the request  of:  [name], MD  CC: Back and neck pain follow-up  ION:[id number]  Allison [name] is a 71 y.o. female coming in with complaint of back and neck pain. OMT 05/19/2022. Patient states same per usual. Still has pain in top of rib cage. Meloxicam didn't help. No new complaints.  Patient has been doing relatively well and has had less of the abdominal pain but is still there.  Patient did have an ultrasound that was fairly unremarkable.  Does have a past medical history for breast cancer treated with chemo and radiation 5 years of tamoxifen.  Patient denies any fevers, chills, any abnormal weight loss.  Medications patient has been prescribed: Meloxicam  Taking:         Reviewed prior external information including notes and imaging from previsou exam, outside providers and external EMR if available.  Patient 1 month ago was having more diarrhea.  As well as notes that were available from care everywhere and other healthcare systems.  Past medical history, social, surgical and family history all reviewed in electronic medical record.  No pertanent information unless stated regarding to the chief complaint.   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 DEXA 03/2020 Osteoporosis T = -2.5 at L1-2, lowest, femurs osteopenia, on fosamax. Continue fosamax.    Personal history of chemotherapy    Personal history of radiation therapy    PONV (postoperative nausea and vomiting)     Allergies  Allergen Reactions    Penicillins     Childhood allergy   Sulfa Antibiotics Hives and Swelling     Review of Systems:  No headache, visual changes, nausea, vomiting, diarrhea, constipation, dizziness, abdominal pain, skin rash, fevers, chills, night sweats, weight loss, swollen lymph nodes, body aches, joint swelling, chest pain, shortness of breath, mood changes. POSITIVE muscle aches  Objective  Blood pressure 92/60, pulse 79, height '5\' 4"'$  (1.626 m), weight 115 lb (52.2 kg), SpO2 95 %.   General: No apparent distress alert and oriented x3 mood and affect normal, dressed appropriately.  HEENT: Pupils equal, extraocular movements intact  Respiratory: Patient's speak in full sentences and does not appear short of breath  Cardiovascular: No lower extremity edema, non tender, no erythema  Gait MSK:  Back back exam still has tightness more in the thoracolumbar juncture.  Does have tightness in the parascapular region right greater than left.  Osteopathic findings  C2 flexed rotated and side bent right C6 flexed rotated and side bent left T3 extended rotated and side bent right inhaled rib L1 flexed rotated and side bent right Sacrum right on right       Assessment and Plan:  Osteoporosis of lumbar spine Patient has had some difficulty but is doing relatively well.  Responding well to osteopathic manipulation.  Still having the pain in the abdominal area that is somewhat concerning for possible atelectasis of the lungs.  Ultrasound of the stomach was unremarkable and  patient denies any fevers, chills, any abnormal weight loss.  We will continue to monitor otherwise.  Has the meloxicam for breakthrough.  Follow-up again in 6 to 8 weeks    Nonallopathic problems  Decision today to treat with OMT was based on Physical Exam  After verbal consent patient was treated with HVLA, ME, FPR techniques in cervical, rib, thoracic, lumbar, and sacral  areas  Patient tolerated the procedure well with improvement  in symptoms  Patient given exercises, stretches and lifestyle modifications  See medications in patient instructions if given  Patient will follow up in 4-8 weeks     The above documentation has been reviewed and is accurate and complete Lyndal Pulley, DO         Note: This dictation was prepared with Dragon dictation along with smaller phrase technology. Any transcriptional errors that result from this process are unintentional.

## 2022-07-07 ENCOUNTER — Encounter: Payer: Self-pay | Admitting: Family Medicine

## 2022-07-07 ENCOUNTER — Ambulatory Visit (INDEPENDENT_AMBULATORY_CARE_PROVIDER_SITE_OTHER): Payer: Medicare Other | Admitting: Family Medicine

## 2022-07-07 DIAGNOSIS — M810 Age-related osteoporosis without current pathological fracture: Secondary | ICD-10-CM

## 2022-07-07 NOTE — Assessment & Plan Note (Signed)
Patient has had some difficulty but is doing relatively well.  Responding well to osteopathic manipulation.  Still having the pain in the abdominal area that is somewhat concerning for possible atelectasis of the lungs.  Ultrasound of the stomach was unremarkable and patient denies any fevers, chills, any abnormal weight loss.  We will continue to monitor otherwise.  Has the meloxicam for breakthrough.  Follow-up again in 6 to 8 weeks

## 2022-07-07 NOTE — Patient Instructions (Signed)
Good to see you!  Keep journal of symptoms to see if we can find the reason See you again in 7-8 weeks

## 2022-08-25 NOTE — Progress Notes (Deleted)
[place] [address] [place] Phone: [phone] Subjective:    I'm seeing this patient by the request  of:  [name], MD  CC:   [id number]  07/07/2022 Patient has had some difficulty but is doing relatively well.  Responding well to osteopathic manipulation.  Still having the pain in the abdominal area that is somewhat concerning for possible atelectasis of the lungs.  Ultrasound of the stomach was unremarkable and patient denies any fevers, chills, any abnormal weight loss.  We will continue to monitor otherwise.  Has the meloxicam for breakthrough.  Follow-up again in 6 to 8 weeks  Update 09/01/2022 Lenka Zhao is a 71 y.o. female coming in with complaint of lumbar spine pain. 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 DEXA 03/2020 Osteoporosis T = -2.5 at L1-2, lowest, femurs osteopenia, on fosamax. Continue fosamax.    Personal history of chemotherapy    Personal history of radiation therapy    PONV (postoperative nausea and vomiting)    Past Surgical History:  Procedure Laterality Date   ABDOMINAL HYSTERECTOMY     BREAST BIOPSY Left 1999   BREAST LUMPECTOMY Left 2000   OPEN REDUCTION INTERNAL FIXATION (ORIF) METACARPAL Left 05/06/2020   Procedure: OPEN REDUCTION INTERNAL FIXATION (ORIF) LEFT THUMB METACARPAL FRACTURE;  Surgeon: [name], MD;  Location: [facility];  Service: Orthopedics;  Laterality: Left;  block in preop   TONSILECTOMY/ADENOIDECTOMY WITH MYRINGOTOMY     TONSILLECTOMY     Social History   Socioeconomic History   Marital status: Married    Spouse name: Not on file   Number of children: 0   Years  of education: Not on file   Highest education level: Not on file  Occupational History   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  Tobacco Use   Smoking status: Former    Types: Cigarettes    Quit date: 12/27/1972    Years since quitting: 49.6   Smokeless tobacco: Never  Vaping Use   Vaping Use: Never used  Substance and Sexual Activity   Alcohol use: Yes    Comment: socially   Drug use: Never   Sexual activity: Not on file  Other Topics Concern   Not on file  Social History Narrative   Pets:  2 cats adopted at the start of Covid    Social Determinants of Health   Financial Resource Strain: Low Risk  (05/10/2022)   Overall Financial Resource Strain (CARDIA)    Difficulty of Paying Living Expenses: Not hard at all  Food Insecurity: No Food Insecurity (05/10/2022)   Hunger Vital Sign    Worried About Running Out of Food in the Last Year: Never true    [place] in the Last Year: Never true  Transportation Needs: No Transportation Needs (05/10/2022)   PRAPARE - Hydrologist (Medical): No    Lack of Transportation (Non-Medical): No  Physical Activity: Sufficiently Active (05/10/2022)   Exercise Vital Sign    Days of Exercise per  Week: 6 days    Minutes of Exercise per Session: 90 min  Stress: No Stress Concern Present (05/10/2022)   [place]    Feeling of Stress : Not at all  Social Connections: Moderately Integrated (05/10/2022)   Social Connection and Isolation Panel [NHANES]    Frequency of Communication with Friends and Family: Three times a week    Frequency of Social Gatherings with Friends and Family: More than three times a week    Attends Religious Services: Never    Marine scientist or Organizations: Yes    Attends Music therapist: More than 4 times per year    Marital Status: Married   Allergies  Allergen  Reactions   Penicillins     Childhood allergy   Sulfa Antibiotics Hives and Swelling   Family History  Problem Relation Age of Onset   Alcohol abuse Mother    Cancer Mother    Alcohol abuse Father    Depression Father    Early death Father    Alcohol abuse Brother    Cancer Brother    Breast cancer Neg Hx     Current Outpatient Medications (Endocrine & Metabolic):    alendronate (FOSAMAX) 70 MG tablet, TAKE 1 TABLET WEEKLY ON AN EMPTY STOMACH WITH A FULL  GLASS OF WATER.    Current Outpatient Medications (Analgesics):    meloxicam (MOBIC) 15 MG tablet, Take 1 tablet (15 mg total) by mouth daily.   Current Outpatient Medications (Other):    Calcium Citrate-Vitamin D (CALCIUM + D PO), Take by mouth.   diclofenac sodium (VOLTAREN) 1 % GEL, Apply 2 grams topically to affected area qid   gabapentin (NEURONTIN) 100 MG capsule, TAKE 2 CAPSULES BY MOUTH AT BEDTIME   MAGNESIUM PO, Take by mouth.   NIACIN PO, Take by mouth.   NON FORMULARY, Tart cherry extract   ondansetron (ZOFRAN) 4 MG tablet, Take 1 tablet (4 mg total) by mouth every 8 (eight) hours as needed for nausea or vomiting.   sertraline (ZOLOFT) 25 MG tablet, Take 1 tablet (25 mg total) by mouth daily.   Turmeric 500 MG TABS, Take by mouth.   Vitamin D, Cholecalciferol, 10 MCG (400 UNIT) TABS, SMARTSIG:1 Tablet(s) By Mouth   Reviewed prior external information including notes and imaging from  primary care provider As well as notes that were available from care everywhere and other healthcare systems.  Past medical history, social, surgical and family history all reviewed in electronic medical record.  No pertanent information unless stated regarding to the chief complaint.   Review of Systems:  No headache, visual changes, nausea, vomiting, diarrhea, constipation, dizziness, abdominal pain, skin rash, fevers, chills, night sweats, weight loss, swollen lymph nodes, body aches, joint swelling, chest pain, shortness of  breath, mood changes. POSITIVE muscle aches  Objective  There were no vitals taken for this visit.   General: No apparent distress alert and oriented x3 mood and affect normal, dressed appropriately.  HEENT: Pupils equal, extraocular movements intact  Respiratory: Patient's speak in full sentences and does not appear short of breath  Cardiovascular: No lower extremity edema, non tender, no erythema      Impression and Recommendations:

## 2022-08-31 ENCOUNTER — Encounter: Payer: Self-pay | Admitting: Family Medicine

## 2022-08-31 ENCOUNTER — Ambulatory Visit (INDEPENDENT_AMBULATORY_CARE_PROVIDER_SITE_OTHER): Payer: Medicare Other | Admitting: Family Medicine

## 2022-08-31 VITALS — BP 100/64 | HR 51 | Temp 98.3°F | Ht 64.0 in | Wt 116.2 lb

## 2022-08-31 DIAGNOSIS — J040 Acute laryngitis: Secondary | ICD-10-CM

## 2022-08-31 DIAGNOSIS — J069 Acute upper respiratory infection, unspecified: Secondary | ICD-10-CM | POA: Diagnosis not present

## 2022-08-31 NOTE — Progress Notes (Signed)
Subjective   CC:  Chief Complaint  Patient presents with   Laryngitis    Pt stated that when she woke up this morning her voice was gone.    HPI: Allison [name] is a 71 y.o. female who presents to the office today to address the problems listed above in the chief complaint. Patient complains of typical URI symptoms including nasal congestion, mild sore throat, cough, and mild malaise.  The symptoms have been present for 2 days. Today she awoke with hoarseness/loss of voice. She denies high fever or productive cough, shortness of breath or significant GI symptoms.  Over-the-counter cold medicines have been minimally or mildly helpful. She is training for 100 bicycle ride this weekend. Road yesterday w/o any problems.  Had 2 negative covid tests at home yesterday and today.   Assessment  1. Laryngitis, acute   2. Viral upper respiratory tract infection      Plan  URI, viral with laryngitis: discussed dx; no sign or sx of bacterial infection is present. Treat supportively with antihistamines, decongestants, and/or cough meds. See AVS for care instructions.   Follow up:prn   No orders of the defined types were placed in this encounter.  No orders of the defined types were placed in this encounter.     I reviewed the patients updated PMH, FH, and SocHx.    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    Priority: Medium    Lumbar herniated disc 03/23/2019    Priority: Medium    Sensorineural hearing loss (SNHL) of both ears 01/04/2022    Priority: Low   Metatarsalgia of left foot 11/02/2019    Priority: Low   Abdominal pain, chronic, epigastric 04/14/2022   Nonallopathic lesion of lumbosacral region 10/16/2019   Nonallopathic lesion of sacral region 10/16/2019    Nonallopathic lesion of thoracic region 10/16/2019   Current Meds  Medication Sig   alendronate (FOSAMAX) 70 MG tablet TAKE 1 TABLET WEEKLY ON AN EMPTY STOMACH WITH A FULL  GLASS OF WATER.   Calcium Citrate-Vitamin D (CALCIUM + D PO) Take by mouth.   diclofenac sodium (VOLTAREN) 1 % GEL Apply 2 grams topically to affected area qid   gabapentin (NEURONTIN) 100 MG capsule TAKE 2 CAPSULES BY MOUTH AT BEDTIME   MAGNESIUM PO Take by mouth.   meloxicam (MOBIC) 15 MG tablet Take 1 tablet (15 mg total) by mouth daily.   NON FORMULARY Tart cherry extract   ondansetron (ZOFRAN) 4 MG tablet Take 1 tablet (4 mg total) by mouth every 8 (eight) hours as needed for nausea or vomiting.   sertraline (ZOLOFT) 25 MG tablet Take 1 tablet (25 mg total) by mouth daily.   Turmeric 500 MG TABS Take by mouth.   Vitamin D, Cholecalciferol, 10 MCG (400 UNIT) TABS SMARTSIG:1 Tablet(s) By Mouth    Review of Systems: Constitutional: Negative for fever malaise or anorexia Cardiovascular: negative for chest pain Respiratory: negative for SOB or pleuritic chest pain Gastrointestinal: negative for abdominal pain  Objective  Vitals: BP 100/64   Pulse (!) 51   Temp 98.3 F (36.8 C)   Ht '5\' 4"'$  (1.626 m)   Wt 116 lb 3.2 oz (52.7 kg)   SpO2 98%  BMI 19.95 kg/m  General: no acute respiratory distress appears well. hoarse Psych:  Alert and oriented, normal mood and affect HEENT: Normocephalic, nasal congestion present, TMs w/o erythema, OP without erythema w/o exudate, no cervical LAD, supple neck  Cardiovascular:  RRR without murmur or gallop. no peripheral edema Respiratory:  Good breath sounds bilaterally, CTAB with normal respiratory effort  Commons side effects, risks, benefits, and alternatives for medications and treatment plan prescribed today were discussed, and the patient expressed understanding of the given instructions. Patient is instructed to call or message via MyChart if he/she has any questions  or concerns regarding our treatment plan. No barriers to understanding were identified. We discussed Red Flag symptoms and signs in detail. Patient expressed understanding regarding what to do in case of urgent or emergency type symptoms.  Medication list was reconciled, printed and provided to the patient in AVS. Patient instructions and summary information was reviewed with the patient as documented in the AVS. This note was prepared with assistance of Dragon voice recognition software. Occasional wrong-word or sound-a-like substitutions may have occurred due to the inherent limitations of voice recognition software

## 2022-09-01 ENCOUNTER — Ambulatory Visit: Payer: Medicare Other | Admitting: Family Medicine

## 2022-09-14 DIAGNOSIS — Z23 Encounter for immunization: Secondary | ICD-10-CM | POA: Diagnosis not present

## 2022-09-14 DIAGNOSIS — H524 Presbyopia: Secondary | ICD-10-CM | POA: Diagnosis not present

## 2022-09-14 DIAGNOSIS — H2513 Age-related nuclear cataract, bilateral: Secondary | ICD-10-CM | POA: Diagnosis not present

## 2022-09-15 NOTE — Progress Notes (Signed)
[place] Phone: [phone] Subjective:   Allison [name], am serving as a scribe for Dr. Hulan Saas.  I'm seeing this patient by the request  of:  [name], MD  CC: Back and hip pain follow-up  [id number]  07/07/2022 Patient has had some difficulty but is doing relatively well.  Responding well to osteopathic manipulation.  Still having the pain in the abdominal area that is somewhat concerning for possible atelectasis of the lungs.  Ultrasound of the stomach was unremarkable and patient denies any fevers, chills, any abnormal weight loss.  We will continue to monitor otherwise.  Has the meloxicam for breakthrough.  Follow-up again in 6 to 8 weeks  Update 09/17/2022 Allison [name] is a 71 y.o. female coming in with complaint of lumbar spine pain. Patient states that she rode 100 miles in [place] a few weeks ago. Had some R sided lumbar spine pain that radiated into the abdomen 2 hours after the ride. Also developed some L shin pain during the ride.   Has not been having any rib pain since last visit.   Stiffness in L cervical spine today with extension. Slept wrong last night.      Past Medical History:  Diagnosis Date   Cancer ([place])    Chicken pox    Depression    Diverticulitis    Lumbar herniated disc    Osteoporosis of lumbar spine 04/01/2020   DEXA 02/2018 Osteopenia T = -1.9 femur, T = -2.2  (T1 = -3.2, T2= -2.5) at lumbar spine; was started on fosamax at that time. DEXA 03/2020 Osteoporosis T = -2.5 at L1-2, lowest, femurs osteopenia, on fosamax. Continue fosamax.    Personal history of chemotherapy    Personal history of radiation therapy    PONV (postoperative nausea and vomiting)    Past Surgical History:  Procedure Laterality Date   ABDOMINAL HYSTERECTOMY     BREAST BIOPSY Left 1999   BREAST LUMPECTOMY Left 2000   OPEN REDUCTION INTERNAL FIXATION (ORIF) METACARPAL Left  05/06/2020   Procedure: OPEN REDUCTION INTERNAL FIXATION (ORIF) LEFT THUMB METACARPAL FRACTURE;  Surgeon: [name], MD;  Location: [facility];  Service: Orthopedics;  Laterality: Left;  block in preop   TONSILECTOMY/ADENOIDECTOMY WITH MYRINGOTOMY     TONSILLECTOMY     Social History   Socioeconomic History   Marital status: Married    Spouse name: Not on file   Number of children: 0   Years of education: Not on file   Highest education level: Not on file  Occupational History   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  Tobacco Use   Smoking status: Former    Types: Cigarettes    Quit date: 12/27/1972    Years since quitting: 49.7   Smokeless tobacco: Never  Vaping Use   Vaping Use: Never used  Substance and Sexual Activity   Alcohol use: Yes    Comment: socially   Drug use: Never   Sexual activity: Not on file  Other Topics Concern   Not on file  Social History Narrative   Pets:  2 cats adopted at the start of Covid    Social Determinants of Health   Financial Resource Strain: Low Risk  (05/10/2022)   Overall Financial Resource Strain (CARDIA)    Difficulty of Paying Living Expenses: Not hard at all  Food Insecurity: [name] [place] (05/10/2022)  Hunger Vital Sign    Worried About Running Out of Food in the Last Year: Never true    Ran Out of Food in the Last Year: Never true  Transportation Needs: [name] Transportation Needs (05/10/2022)   PRAPARE - Hydrologist (Medical): [name]    Lack of Transportation (Non-Medical): [name]  Physical Activity: Sufficiently Active (05/10/2022)   Exercise Vital Sign    Days of Exercise per Week: 6 days    Minutes of Exercise per Session: 90 min  Stress: [name] Stress Concern Present (05/10/2022)   [place]    Feeling of Stress : Not at all  Social Connections: Moderately Integrated (05/10/2022)    Social Connection and Isolation Panel [NHANES]    Frequency of Communication with Friends and Family: Three times a week    Frequency of Social Gatherings with Friends and Family: More than three times a week    Attends Religious Services: Never    Marine scientist or Organizations: Yes    Attends Music therapist: More than 4 times per year    Marital Status: Married   Allergies  Allergen Reactions   Penicillins     Childhood allergy   Sulfa Antibiotics Hives and Swelling   Family History  Problem Relation Age of Onset   Alcohol abuse Mother    Cancer Mother    Alcohol abuse Father    Depression Father    Early death Father    Alcohol abuse Brother    Cancer Brother    Breast cancer Neg Hx     Current Outpatient Medications (Endocrine & Metabolic):    alendronate (FOSAMAX) 70 MG tablet, TAKE 1 TABLET WEEKLY ON AN EMPTY STOMACH WITH A FULL  GLASS OF WATER.    Current Outpatient Medications (Analgesics):    meloxicam (MOBIC) 15 MG tablet, Take 1 tablet (15 mg total) by mouth daily.   Current Outpatient Medications (Other):    Calcium Citrate-Vitamin D (CALCIUM + D PO), Take by mouth.   diclofenac sodium (VOLTAREN) 1 % GEL, Apply 2 grams topically to affected area qid   gabapentin (NEURONTIN) 100 MG capsule, TAKE 2 CAPSULES BY MOUTH AT BEDTIME   MAGNESIUM PO, Take by mouth.   NON FORMULARY, Tart cherry extract   ondansetron (ZOFRAN) 4 MG tablet, Take 1 tablet (4 mg total) by mouth every 8 (eight) hours as needed for nausea or vomiting.   sertraline (ZOLOFT) 25 MG tablet, Take 1 tablet (25 mg total) by mouth daily.   Turmeric 500 MG TABS, Take by mouth.   Vitamin D, Cholecalciferol, 10 MCG (400 UNIT) TABS, SMARTSIG:1 Tablet(s) By Mouth   Reviewed prior external information including notes and imaging from  primary care provider As well as notes that were available from care everywhere and other healthcare systems.  Past medical history, social,  surgical and family history all reviewed in electronic medical record.  [name]   Review of Systems:  [name], visual changes, nausea, vomiting, diarrhea, constipation, dizziness, abdominal pain, skin rash, fevers, chills, night sweats, weight loss, swollen lymph nodes, body aches, joint swelling, chest pain, shortness of breath, mood changes. POSITIVE muscle aches  Objective  Blood pressure 110/70, pulse 83, height '5\' 4"'$  (1.626 m), weight 116 lb (52.6 kg), SpO2 99 %.   General: [name], dressed appropriately.  HEENT: Pupils  equal, extraocular movements intact  Respiratory: Patient's speak in full sentences and does not appear short of breath  Cardiovascular: [name], non tender, [name]  Hip has some mild discomfort noted over the greater trochanteric area.  Some tightness noted in the sacroiliac joint as well.  Patient does have some mild tenderness in the parascapular region.  Osteopathic findings  C4 flexed rotated and side bent left T4 extended rotated and side bent right inhaled third rib L3 flexed rotated and side bent right Sacrum right on right     Impression and Recommendations:     The above documentation has been reviewed and is accurate and complete Lyndal Pulley, DO

## 2022-09-17 ENCOUNTER — Ambulatory Visit (INDEPENDENT_AMBULATORY_CARE_PROVIDER_SITE_OTHER): Payer: Medicare Other | Admitting: Family Medicine

## 2022-09-17 VITALS — BP 110/70 | HR 83 | Ht 64.0 in | Wt 116.0 lb

## 2022-09-17 DIAGNOSIS — M9901 Segmental and somatic dysfunction of cervical region: Secondary | ICD-10-CM | POA: Diagnosis not present

## 2022-09-17 DIAGNOSIS — M9903 Segmental and somatic dysfunction of lumbar region: Secondary | ICD-10-CM

## 2022-09-17 DIAGNOSIS — M810 Age-related osteoporosis without current pathological fracture: Secondary | ICD-10-CM

## 2022-09-17 DIAGNOSIS — M9908 Segmental and somatic dysfunction of rib cage: Secondary | ICD-10-CM | POA: Diagnosis not present

## 2022-09-17 DIAGNOSIS — M999 Biomechanical lesion, unspecified: Secondary | ICD-10-CM

## 2022-09-17 DIAGNOSIS — M1710 Unilateral primary osteoarthritis, unspecified knee: Secondary | ICD-10-CM | POA: Insufficient documentation

## 2022-09-17 DIAGNOSIS — M9904 Segmental and somatic dysfunction of sacral region: Secondary | ICD-10-CM

## 2022-09-17 DIAGNOSIS — M9902 Segmental and somatic dysfunction of thoracic region: Secondary | ICD-10-CM | POA: Diagnosis not present

## 2022-09-17 NOTE — Assessment & Plan Note (Deleted)
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  Seems to be right greater than left.  Mild lateral chronic meniscal tear findings also noted.  Discussed icing regimen and home exercises.  Injections given today.  Patient should do relatively well with this.  Could be a candidate for viscosupplementation.  Follow-up in 6 weeks if not improved significantly we will consider the possibility of formal physical therapy as well.

## 2022-09-17 NOTE — Assessment & Plan Note (Signed)

## 2022-09-17 NOTE — Patient Instructions (Signed)
Good to see you More gatorade during rides See me in 2 months

## 2022-09-17 NOTE — Assessment & Plan Note (Signed)
Has responded relatively well to osteopathic manipulation.  We did discuss proper riding positioning.  Discussed which activities to do and which ones to avoid.  Increase activity slowly otherwise.  See no reason for patient to limit any of her activities at this time.  Follow-up again in 6 to 8 weeks.

## 2022-09-29 ENCOUNTER — Ambulatory Visit: Payer: Medicare Other | Admitting: Family Medicine

## 2022-09-30 DIAGNOSIS — Z23 Encounter for immunization: Secondary | ICD-10-CM | POA: Diagnosis not present

## 2022-11-11 NOTE — Progress Notes (Signed)
[place] Allison [name] Phone: [phone] Subjective:   [name], am serving as a scribe for Dr. Hulan Saas.  I'm seeing this patient by the request  of:  [name], MD  CC: Back and neck pain follow-up  [id number]  Allison [name] is a 71 y.o. female coming in with complaint of back and neck pain. OMT 09/17/2022. Patient states that she has been doing well but last week the pain in upper abdomen returned. Has been going through some stuff at home and she was bent over for a prolonged period and this increased her pain.   Medications patient has been prescribed: None  Taking:         Reviewed prior external information including notes and imaging from previsou exam, outside providers and external EMR if available.   As well as notes that were available from care everywhere and other healthcare systems.  Past medical history, social, surgical and family history all reviewed in electronic medical record.  No pertanent information unless stated regarding to the chief complaint.   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 DEXA 03/2020 Osteoporosis T = -2.5 at L1-2, lowest, femurs osteopenia, on fosamax. Continue fosamax.    Personal history of chemotherapy    Personal history of radiation therapy    PONV (postoperative nausea and vomiting)     Allergies  Allergen Reactions   Penicillins     Childhood allergy   Sulfa Antibiotics Hives and Swelling     Review of Systems:  No headache, visual changes, nausea, vomiting, diarrhea, constipation, dizziness, abdominal pain, skin rash, fevers, chills, night sweats, weight loss, swollen lymph nodes,  body aches, joint swelling, chest pain, shortness of breath, mood changes. POSITIVE muscle aches  Objective  Blood pressure 102/72, pulse 67, height '5\' 4"'$  (1.626 m), weight 118 lb (53.5 kg), SpO2 98 %.   General: No apparent distress alert and oriented x3 mood and affect normal, dressed appropriately.  HEENT: Pupils equal, extraocular movements intact  Respiratory: Patient's speak in full sentences and does not appear short of breath  Cardiovascular: No lower extremity edema, non tender, no erythema  Gait normal overall. MSK:  Back low back does have some loss of lordosis.  Some tightness noted with flexion extension of the back noted.  Osteopathic findings   T5 extended rotated and side bent left L2 flexed rotated and side bent right L4 flexed rotated and side bent left Sacrum right on right       Assessment and Plan:  Lumbar herniated disc Degenerative disc as well as patient having some osteoporosis.  Is responding well though to the muscle energy and the osteopathic manipulations.  We will continue the core strengthening.  Discussed posture and ergonomics as well.  Continue with the different stretching mechanics as well.  Follow-up with me again in 6 to 8 weeks.    Nonallopathic problems  Decision today to treat with OMT was based on Physical Exam  After verbal consent patient was treated with HVLA, ME, FPR techniques in  thoracic, lumbar, and sacral  areas  Patient tolerated the procedure well  with improvement in symptoms  Patient given exercises, stretches and lifestyle modifications  See medications in patient instructions if given  Patient will follow up in 4-8 weeks    The above documentation has been reviewed and is accurate and complete Lyndal Pulley, DO          Note: This dictation was prepared with Dragon dictation along with smaller phrase technology. Any transcriptional errors that result from this process are unintentional.

## 2022-11-15 ENCOUNTER — Encounter: Payer: Self-pay | Admitting: Family Medicine

## 2022-11-15 ENCOUNTER — Ambulatory Visit (INDEPENDENT_AMBULATORY_CARE_PROVIDER_SITE_OTHER): Payer: Medicare Other | Admitting: Family Medicine

## 2022-11-15 VITALS — BP 102/72 | HR 67 | Ht 64.0 in | Wt 118.0 lb

## 2022-11-15 DIAGNOSIS — M9903 Segmental and somatic dysfunction of lumbar region: Secondary | ICD-10-CM

## 2022-11-15 DIAGNOSIS — M5126 Other intervertebral disc displacement, lumbar region: Secondary | ICD-10-CM

## 2022-11-15 DIAGNOSIS — M9904 Segmental and somatic dysfunction of sacral region: Secondary | ICD-10-CM

## 2022-11-15 DIAGNOSIS — M9902 Segmental and somatic dysfunction of thoracic region: Secondary | ICD-10-CM | POA: Diagnosis not present

## 2022-11-15 NOTE — Assessment & Plan Note (Signed)
Degenerative disc as well as patient having some osteoporosis.  Is responding well though to the muscle energy and the osteopathic manipulations.  We will continue the core strengthening.  Discussed posture and ergonomics as well.  Continue with the different stretching mechanics as well.  Follow-up with me again in 6 to 8 weeks.

## 2022-11-15 NOTE — Patient Instructions (Signed)
See me again in 8 weeks Happy Holidays  I think you need card table for Christmas

## 2022-11-29 ENCOUNTER — Other Ambulatory Visit: Payer: Self-pay | Admitting: Family Medicine

## 2022-11-29 DIAGNOSIS — M810 Age-related osteoporosis without current pathological fracture: Secondary | ICD-10-CM

## 2022-12-08 DIAGNOSIS — D225 Melanocytic nevi of trunk: Secondary | ICD-10-CM | POA: Diagnosis not present

## 2022-12-08 DIAGNOSIS — L820 Inflamed seborrheic keratosis: Secondary | ICD-10-CM | POA: Diagnosis not present

## 2022-12-08 DIAGNOSIS — L738 Other specified follicular disorders: Secondary | ICD-10-CM | POA: Diagnosis not present

## 2022-12-08 DIAGNOSIS — L821 Other seborrheic keratosis: Secondary | ICD-10-CM | POA: Diagnosis not present

## 2022-12-08 DIAGNOSIS — Z85828 Personal history of other malignant neoplasm of skin: Secondary | ICD-10-CM | POA: Diagnosis not present

## 2022-12-08 DIAGNOSIS — L570 Actinic keratosis: Secondary | ICD-10-CM | POA: Diagnosis not present

## 2022-12-08 DIAGNOSIS — D2271 Melanocytic nevi of right lower limb, including hip: Secondary | ICD-10-CM | POA: Diagnosis not present

## 2022-12-08 DIAGNOSIS — D2261 Melanocytic nevi of right upper limb, including shoulder: Secondary | ICD-10-CM | POA: Diagnosis not present

## 2022-12-08 DIAGNOSIS — D2262 Melanocytic nevi of left upper limb, including shoulder: Secondary | ICD-10-CM | POA: Diagnosis not present

## 2022-12-16 ENCOUNTER — Encounter (HOSPITAL_COMMUNITY): Payer: Self-pay | Admitting: *Deleted

## 2022-12-16 ENCOUNTER — Ambulatory Visit (HOSPITAL_COMMUNITY)
Admission: EM | Admit: 2022-12-16 | Discharge: 2022-12-16 | Disposition: A | Discharge status: Home / Self Care | Payer: Medicare Other | Attending: Physician Assistant | Admitting: Physician Assistant

## 2022-12-16 ENCOUNTER — Other Ambulatory Visit: Payer: Self-pay

## 2022-12-16 DIAGNOSIS — Z1152 Encounter for screening for COVID-19: Secondary | ICD-10-CM | POA: Diagnosis not present

## 2022-12-16 DIAGNOSIS — Z87891 Personal history of nicotine dependence: Secondary | ICD-10-CM | POA: Insufficient documentation

## 2022-12-16 DIAGNOSIS — H6993 Unspecified Eustachian tube disorder, bilateral: Secondary | ICD-10-CM | POA: Diagnosis not present

## 2022-12-16 DIAGNOSIS — R0981 Nasal congestion: Secondary | ICD-10-CM | POA: Diagnosis not present

## 2022-12-16 LAB — POC INFLUENZA A AND B ANTIGEN (URGENT CARE ONLY)
INFLUENZA A ANTIGEN, POC: NEGATIVE
INFLUENZA B ANTIGEN, POC: NEGATIVE

## 2022-12-16 MED ORDER — CETIRIZINE HCL 10 MG PO TABS: 10.0000 mg | ORAL_TABLET | Freq: Every day | ORAL | 0 refills | Status: AC

## 2022-12-16 MED ORDER — FLUTICASONE PROPIONATE 50 MCG/ACT NA SUSP: 1.0000 | Freq: Every day | NASAL | 0 refills | Status: AC

## 2022-12-16 NOTE — Discharge Instructions (Signed)
Your flu testing is negative.  We will contact you if you are positive for COVID.  Continue Nettie Potts/sinus rinses.  I would recommend starting allergy medication including cetirizine at night and Flonase 1 spray each nostril daily.  Make sure you are resting and drinking plenty of fluid.  You can use over-the-counter medications including Mucinex.  If your symptoms are changing or worsening in any way please return for reevaluation.

## 2022-12-16 NOTE — ED Provider Notes (Signed)
[place]    CSN: [id number] Arrival date & time: 12/16/22  [id number]      History   Chief Complaint Chief Complaint  Patient presents with   Nasal Congestion   Ear Fullness    HPI Allison [name] is a 71 y.o. female.   Patient presents today with a 2-day history of URI symptoms.  Reports nasal congestion, sinus pressure, ear fullness/discomfort.  Denies any fever, cough, chest pain, shortness of breath, nausea, vomiting, diarrhea.  She did take an at-home COVID test when symptoms began that was negative.  She has had COVID-19 vaccines and flu shot.  She has not had COVID in the past.  Denies any known sick contacts.  She does not take allergy medication on a regular basis.  Denies history of allergies, asthma, COPD, smoking.  She has been using Mucinex as well as Nettie pot with only temporary improvement of symptoms.  Denies any recent antibiotics or steroids.  She is feeling poorly and has difficulty with her daily activities as result of symptoms.    Past Medical History:  Diagnosis Date   Cancer ([place])    Chicken pox    Depression    Diverticulitis    Lumbar herniated disc    Osteoporosis of lumbar spine 04/01/2020   DEXA 02/2018 Osteopenia T = -1.9 femur, T = -2.2  (T1 = -3.2, T2= -2.5) at lumbar spine; was started on fosamax at that time. DEXA 03/2020 Osteoporosis T = -2.5 at L1-2, lowest, femurs osteopenia, on fosamax. Continue fosamax.    Personal history of chemotherapy    Personal history of radiation therapy    PONV (postoperative nausea and vomiting)     Patient Active Problem List   Diagnosis Date Noted   Abdominal pain, chronic, epigastric 04/14/2022   Sensorineural hearing loss (SNHL) of both ears 01/04/2022   Osteoporosis of lumbar spine 04/01/2020   Primary osteoarthritis of right hip 11/02/2019   Metatarsalgia of left foot 11/02/2019   Nonallopathic lesion of lumbosacral region 10/16/2019   Nonallopathic lesion of sacral region 10/16/2019    Nonallopathic lesion of thoracic region 10/16/2019   Dysthymia 03/23/2019   Lumbar herniated disc 03/23/2019   Hx of breast cancer, left     Past Surgical History:  Procedure Laterality Date   ABDOMINAL HYSTERECTOMY     BREAST BIOPSY Left 1999   BREAST LUMPECTOMY Left 2000   OPEN REDUCTION INTERNAL FIXATION (ORIF) METACARPAL Left 05/06/2020   Procedure: OPEN REDUCTION INTERNAL FIXATION (ORIF) LEFT THUMB METACARPAL FRACTURE;  Surgeon: [name], MD;  Location: [facility];  Service: Orthopedics;  Laterality: Left;  block in preop   TONSILECTOMY/ADENOIDECTOMY WITH MYRINGOTOMY     TONSILLECTOMY      OB History   No obstetric history on file.      Home Medications    Prior to Admission medications   Medication Sig Start Date End Date Taking? Authorizing Provider  alendronate (FOSAMAX) 70 MG tablet TAKE 1 TABLET WEEKLY ON AN EMPTY STOMACH WITH A FULL  GLASS OF WATER. 11/29/22  Yes [name], MD  Calcium Citrate-Vitamin D (CALCIUM + D PO) Take by mouth.   Yes [provider]  cetirizine (ZYRTEC ALLERGY) 10 MG tablet Take 1 tablet (10 mg total) by mouth at bedtime. 12/16/22  Yes Jarin Cornfield K, PA-C  diclofenac sodium (VOLTAREN) 1 % GEL Apply 2 grams topically to affected area qid 03/30/19  Yes Gerda Diss, DO  fluticasone (FLONASE) 50 MCG/ACT nasal spray  Place 1 spray into both nostrils daily. 12/16/22  Yes Aslan Himes K, PA-C  gabapentin (NEURONTIN) 100 MG capsule TAKE 2 CAPSULES BY MOUTH AT BEDTIME 04/30/22  Yes [name], MD  MAGNESIUM PO Take by mouth.   Yes [provider]  NON FORMULARY Tart cherry extract   Yes [provider]  sertraline (ZOLOFT) 25 MG tablet Take 1 tablet (25 mg total) by mouth daily. 01/04/22  Yes [name], MD  Turmeric 500 MG TABS Take by mouth.   Yes [provider]  Vitamin D, Cholecalciferol, 10 MCG (400 UNIT) TABS SMARTSIG:1 Tablet(s) By Mouth 04/27/22  Yes [provider]   meloxicam (MOBIC) 15 MG tablet Take 1 tablet (15 mg total) by mouth daily. 05/19/22   Lyndal Pulley, DO  ondansetron (ZOFRAN) 4 MG tablet Take 1 tablet (4 mg total) by mouth every 8 (eight) hours as needed for nausea or vomiting. 06/10/22   Lucretia Kern, DO    Family History Family History  Problem Relation Age of Onset   Alcohol abuse Mother    Cancer Mother    Alcohol abuse Father    Depression Father    Early death Father    Alcohol abuse Brother    Cancer Brother    Breast cancer Neg Hx     Social History Social History   Tobacco Use   Smoking status: Former    Types: Cigarettes    Quit date: 12/27/1972    Years since quitting: 50.0   Smokeless tobacco: Never  Vaping Use   Vaping Use: Never used  Substance Use Topics   Alcohol use: Yes    Comment: socially   Drug use: Never     Allergies   Penicillins and Sulfa antibiotics   Review of Systems Review of Systems  Constitutional:  Positive for activity change. Negative for appetite change, fatigue and fever.  HENT:  Positive for congestion, ear pain and sinus pressure. Negative for sneezing and sore throat.   Respiratory:  Negative for cough and shortness of breath.   Cardiovascular:  Negative for chest pain.  Gastrointestinal:  Negative for abdominal pain, diarrhea, nausea and vomiting.     Physical Exam Triage Vital Signs ED Triage Vitals  Enc Vitals Group     BP 12/16/22 1239 123/70     Pulse Rate 12/16/22 1239 68     Resp 12/16/22 1239 14     Temp 12/16/22 1239 97.8 F (36.6 C)     Temp Source 12/16/22 1239 Oral     SpO2 12/16/22 1239 97 %     Weight --      Height --      Head Circumference --      Peak Flow --      Pain Score 12/16/22 1240 2     Pain Loc --      Pain Edu? --      Excl. in [place]? --    No data found.  Updated Vital Signs BP 123/70   Pulse 68   Temp 97.8 F (36.6 C) (Oral)   Resp 14   SpO2 97%   Visual Acuity Right Eye Distance:   Left Eye Distance:   Bilateral  Distance:    Right Eye Near:   Left Eye Near:    Bilateral Near:     Physical Exam Vitals reviewed.  Constitutional:      General: She is awake. She is not in acute distress.    Appearance:  Normal appearance. She is well-developed. She is not ill-appearing.     Comments: Very pleasant female appears stated age in no acute distress sitting comfortably in exam room  HENT:     Head: Normocephalic and atraumatic.     Right Ear: Tympanic membrane, ear canal and external ear normal. There is impacted cerumen. Tympanic membrane is not erythematous or bulging.     Left Ear: Ear canal and external ear normal. A middle ear effusion is present. Tympanic membrane is not erythematous or bulging.     Ears:     Comments: Right ear: Partial cerumen impaction; able to visualize approximately 60% of TM that appears normal.    Nose:     Right Sinus: No maxillary sinus tenderness or frontal sinus tenderness.     Left Sinus: No maxillary sinus tenderness or frontal sinus tenderness.     Mouth/Throat:     Pharynx: Uvula midline. No oropharyngeal exudate or posterior oropharyngeal erythema.  Cardiovascular:     Rate and Rhythm: Normal rate and regular rhythm.     Heart sounds: Normal heart sounds, S1 normal and S2 normal. No murmur heard. Pulmonary:     Effort: Pulmonary effort is normal.     Breath sounds: Normal breath sounds. No wheezing, rhonchi or rales.     Comments: Clear to auscultation bilaterally Psychiatric:        Behavior: Behavior is cooperative.      UC Treatments / Results  Labs (all labs ordered are listed, but only abnormal results are displayed) Labs Reviewed  SARS CORONAVIRUS 2 (TAT 6-24 HRS)  POC INFLUENZA A AND B ANTIGEN (URGENT CARE ONLY)    EKG   Radiology No results found.  Procedures Procedures (including critical care time)  Medications Ordered in UC Medications - No data to display  Initial Impression / Assessment and Plan / UC Course  I have reviewed the  triage vital signs and the nursing notes.  Pertinent labs & imaging results that were available during my care of the patient were reviewed by me and considered in my medical decision making (see chart for details).     Patient is well-appearing, afebrile, nontoxic, nontachycardic.  No evidence of acute infection on physical exam that would warrant initiation of antibiotics.  Suspect viral etiology versus allergies.  Flu testing was negative in clinic.  COVID PCR is pending.  We will contact her if this is positive.  She was encouraged to continue over-the-counter medications including nasal saline/sinus rinses, Mucinex.  Will start antihistamine and Flonase to help manage symptoms.  She is to rest and drink plenty of fluid.  Discussed that if her symptoms are not improving quickly she should return for reevaluation.  If she has any worsening symptoms including fever, cough, worsening sinus pressure/congestion, weakness she should be seen immediately.  Strict return precautions given.  Final Clinical Impressions(s) / UC Diagnoses   Final diagnoses:  Sinus congestion  Dysfunction of both eustachian tubes     Discharge Instructions      Your flu testing is negative.  We will contact you if you are positive for COVID.  Continue Nettie Potts/sinus rinses.  I would recommend starting allergy medication including cetirizine at night and Flonase 1 spray each nostril daily.  Make sure you are resting and drinking plenty of fluid.  You can use over-the-counter medications including Mucinex.  If your symptoms are changing or worsening in any way please return for reevaluation.     ED Prescriptions  Medication Sig Dispense Auth. Provider   cetirizine (ZYRTEC ALLERGY) 10 MG tablet Take 1 tablet (10 mg total) by mouth at bedtime. 30 tablet Dashonda Bonneau K, PA-C   fluticasone (FLONASE) 50 MCG/ACT nasal spray Place 1 spray into both nostrils daily. 16 g Jhoel Stieg K, PA-C      PDMP not reviewed  this encounter.   Terrilee Croak, PA-C 12/16/22 1337

## 2022-12-16 NOTE — ED Triage Notes (Signed)
C/O starting with head and bilat ear fullness and congestion and HA onset 2 days ago. Has used [employer] -- states felts it helps momentarily with ear popping, but then head seems to end up feeling full again. Yesterday after doing [employer] started with what she describes as possible right eustachian tube discomfort. States does not feel lightheaded, but feels slight dizziness. Had neg home Covid test.

## 2022-12-17 ENCOUNTER — Ambulatory Visit: Payer: Medicare Other | Admitting: Physician Assistant

## 2022-12-17 LAB — SARS CORONAVIRUS 2 (TAT 6-24 HRS): SARS Coronavirus 2: NEGATIVE

## 2022-12-29 ENCOUNTER — Encounter: Payer: Self-pay | Admitting: Family Medicine

## 2023-01-03 ENCOUNTER — Other Ambulatory Visit: Payer: Self-pay | Admitting: Family Medicine

## 2023-01-03 DIAGNOSIS — Z1231 Encounter for screening mammogram for malignant neoplasm of breast: Secondary | ICD-10-CM

## 2023-01-04 ENCOUNTER — Encounter: Payer: Self-pay | Admitting: Family Medicine

## 2023-01-04 ENCOUNTER — Ambulatory Visit (INDEPENDENT_AMBULATORY_CARE_PROVIDER_SITE_OTHER): Payer: Medicare Other | Admitting: Family Medicine

## 2023-01-04 VITALS — BP 110/70 | HR 61 | Temp 98.7°F | Ht 64.0 in | Wt 118.2 lb

## 2023-01-04 DIAGNOSIS — Z029 Encounter for administrative examinations, unspecified: Secondary | ICD-10-CM | POA: Diagnosis not present

## 2023-01-04 DIAGNOSIS — M5126 Other intervertebral disc displacement, lumbar region: Secondary | ICD-10-CM

## 2023-01-04 DIAGNOSIS — J309 Allergic rhinitis, unspecified: Secondary | ICD-10-CM | POA: Diagnosis not present

## 2023-01-04 DIAGNOSIS — F341 Dysthymic disorder: Secondary | ICD-10-CM | POA: Diagnosis not present

## 2023-01-04 DIAGNOSIS — Z853 Personal history of malignant neoplasm of breast: Secondary | ICD-10-CM

## 2023-01-04 DIAGNOSIS — Z111 Encounter for screening for respiratory tuberculosis: Secondary | ICD-10-CM | POA: Diagnosis not present

## 2023-01-04 DIAGNOSIS — M1611 Unilateral primary osteoarthritis, right hip: Secondary | ICD-10-CM | POA: Diagnosis not present

## 2023-01-04 DIAGNOSIS — M810 Age-related osteoporosis without current pathological fracture: Secondary | ICD-10-CM | POA: Diagnosis not present

## 2023-01-04 LAB — CBC WITH DIFFERENTIAL/PLATELET
Basophils Absolute: 0 10*3/uL (ref 0.0–0.1)
Basophils Relative: 0.4 % (ref 0.0–3.0)
Eosinophils Absolute: 0.1 10*3/uL (ref 0.0–0.7)
Eosinophils Relative: 1.3 % (ref 0.0–5.0)
HCT: 40.6 % (ref 36.0–46.0)
Hemoglobin: 13.4 g/dL (ref 12.0–15.0)
Lymphocytes Relative: 22.5 % (ref 12.0–46.0)
Lymphs Abs: 1.3 10*3/uL (ref 0.7–4.0)
MCHC: 33 g/dL (ref 30.0–36.0)
MCV: 94.7 fl (ref 78.0–100.0)
Monocytes Absolute: 0.4 10*3/uL (ref 0.1–1.0)
Monocytes Relative: 7.6 % (ref 3.0–12.0)
Neutro Abs: 4 10*3/uL (ref 1.4–7.7)
Neutrophils Relative %: 68.2 % (ref 43.0–77.0)
Platelets: 323 10*3/uL (ref 150.0–400.0)
RBC: 4.28 Mil/uL (ref 3.87–5.11)
RDW: 13.8 % (ref 11.5–15.5)
WBC: 5.8 10*3/uL (ref 4.0–10.5)

## 2023-01-04 LAB — COMPREHENSIVE METABOLIC PANEL
ALT: 12 U/L (ref 0–35)
AST: 22 U/L (ref 0–37)
Albumin: 4.4 g/dL (ref 3.5–5.2)
Alkaline Phosphatase: 31 U/L — ABNORMAL LOW (ref 39–117)
BUN: 15 mg/dL (ref 6–23)
CO2: 28 mEq/L (ref 19–32)
Calcium: 9.7 mg/dL (ref 8.4–10.5)
Chloride: 102 mEq/L (ref 96–112)
Creatinine, Ser: 0.85 mg/dL (ref 0.40–1.20)
GFR: 68.9 mL/min (ref >60.00)
Glucose, Bld: 89 mg/dL (ref 70–99)
Potassium: 5.7 mEq/L — ABNORMAL HIGH (ref 3.5–5.1)
Sodium: 138 mEq/L (ref 135–145)
Total Bilirubin: 0.6 mg/dL (ref 0.2–1.2)
Total Protein: 6.7 g/dL (ref 6.0–8.3)

## 2023-01-04 MED ORDER — SERTRALINE HCL 25 MG PO TABS: 25.0000 mg | ORAL_TABLET | Freq: Every day | ORAL | 3 refills | Status: DC

## 2023-01-04 NOTE — Progress Notes (Signed)
Subjective  Chief Complaint  Patient presents with   Annual Exam    Pt here for Annual exam and is currently fasting    HPI: Allison [name] is a 72 y.o. female who presents to [facility] at [facility] today for a Female Wellness Visit. She also has the concerns and/or needs as listed above in the chief complaint. These will be addressed in addition to the Health Maintenance Visit.   Wellness Visit: annual visit with health maintenance review and exam without Pap  HM: mammo upcoming. Dexa reviewed: stable osteoporosis on fosamax: this will be her 5th year. Continues to be extremely active: completed 100 mild bicycle ride in September and is training for her next. No injuries.  Moving to [place]: needs admin paperwork completed: see media section. Needs TB screen and cog exam.  Chronic disease f/u and/or acute problem visit: (deemed necessary to be done in addition to the wellness visit): Osteopososis: stable as above. Will complete 5 years of fosamax and then reassess.  Dysthymia is stable. Doing well. Conitnues on sertraline 25 daily. No more sxs or concerns with life's transition. Married 50 years; independent, happy. Excited about upcoming move.  H/o breast cancer: stable.  Allergies controlled on meds Back pain: sees sports med; on gabapentin at night.   Assessment  1. Osteoporosis of lumbar spine   2. Dysthymia   3. Primary osteoarthritis of right hip   4. Hx of breast cancer, left   5. Screening-pulmonary TB   6. Encounter for administrative examinations      Plan  Female Wellness Visit: Age appropriate Health Maintenance and Prevention measures were discussed with patient. Included topics are cancer screening recommendations, ways to keep healthy (see AVS) including dietary and exercise recommendations, regular eye and dental care, use of seat belts, and avoidance of moderate alcohol use and tobacco use. Mammo scheduled.  BMI: discussed patient's BMI and  encouraged positive lifestyle modifications to help get to or maintain a target BMI. HM needs and immunizations were addressed and ordered. See below for orders. See HM and immunization section for updates. UTD Routine labs and screening tests ordered including cmp, cbc and lipids where appropriate. Discussed recommendations regarding Vit D and calcium supplementation (see AVS)  Chronic disease management visit and/or acute problem visit: Admin encounter: MMSE 30/30; await TB screen and complete paperwork for [place] residency.  Continue fosamax Continue allergy meds Continue sertraline '25mg'$  daily for well controlled dysthymia  Follow up: 12 mo for cpe  Orders Placed This Encounter  Procedures   QuantiFERON-TB Gold Plus   CBC with Differential/Platelet   Comprehensive metabolic panel   No orders of the defined types were placed in this encounter.     Body mass index is 20.29 kg/m. Wt Readings from Last 3 Encounters:  01/04/23 118 lb 3.2 oz (53.6 kg)  11/15/22 118 lb (53.5 kg)  09/17/22 116 lb (52.6 kg)     Patient Active Problem List   Diagnosis Date Noted   Dysthymia 03/23/2019    Priority: High    Stable on Zoloft 25 mg q hs. Continue.     Hx of breast cancer, left     Priority: High    2000; dxd in [place]; treated with left lumpectomy, chemo and radiation tx, 5 years of tamoxifen    Osteoporosis of lumbar spine 04/01/2020    Priority: Medium     DEXA 02/2018 Osteopenia T = -1.9 femur, T = -2.2  (T1 = -3.2, T2= -2.5) at  lumbar spine; was started on fosamax at that time. DEXA 03/2020 Osteoporosis T = -2.5 at L1-2, lowest, femurs osteopenia, on fosamax. Continue fosamax.  DEXA 05/2022 Osteoporosis T = -2.4 at L1-2, stable. Femurs osteopenia, on fosamax. Continue fosamax.     Primary osteoarthritis of right hip 11/02/2019    Priority: Medium    Lumbar herniated disc 03/23/2019    Priority: Medium     Hx of bulging disc. Has been seeing Dr. Rhys Martini, would like to  see Dr. Paulla Fore.    Sensorineural hearing loss (SNHL) of both ears 01/04/2022    Priority: Low    [facility]; hearing aides    Metatarsalgia of left foot 11/02/2019    Priority: Low   Abdominal pain, chronic, epigastric 04/14/2022   Nonallopathic lesion of lumbosacral region 10/16/2019   Nonallopathic lesion of sacral region 10/16/2019   Nonallopathic lesion of thoracic region 10/16/2019   Health Maintenance  Topic Date Due   COVID-19 Vaccine (5 - 2023-24 season) 01/20/2023 (Originally 08/27/2022)   MAMMOGRAM  02/03/2023   Medicare Annual Wellness (AWV)  05/11/2023   DEXA SCAN  06/02/2024   COLONOSCOPY (Pts 45-60yr Insurance coverage will need to be confirmed)  12/07/2025   DTaP/Tdap/Td (2 - Td or Tdap) 02/10/2026   Pneumonia Vaccine 72 Years old  Completed   INFLUENZA VACCINE  Completed   Hepatitis C Screening  Completed   Zoster Vaccines- Shingrix  Completed   HPV VACCINES  Aged Out   Immunization History  Administered Date(s) Administered   Fluad Quad(high Dose 65+) 09/04/2019, 09/04/2020, 10/07/2021, 09/10/2022   Influenza-Unspecified 02/07/2015, 09/02/2018   PFIZER(Purple Top)SARS-COV-2 Vaccination 01/15/2020, 02/05/2020, 09/30/2020   PNEUMOCOCCAL CONJUGATE-20 01/04/2022   Pfizer Covid-19 Vaccine Bivalent Booster 120yr& up 04/22/2021   Pneumococcal-Unspecified 03/15/2018   Respiratory Syncytial Virus Vaccine,Recomb Aduvanted(Arexvy) 09/10/2022   Tdap 02/11/2016   Zoster Recombinat (Shingrix) 09/07/2011, 01/06/2019, 05/08/2019   We updated and reviewed the patient's past history in detail and it is documented below. Allergies: Patient is allergic to penicillins and sulfa antibiotics. Past Medical History Patient  has a past medical history of Cancer ([place] Chicken pox, Depression, Diverticulitis, Lumbar herniated disc, Osteoporosis of lumbar spine (04/01/2020), Personal history of chemotherapy, Personal history of radiation therapy, and PONV (postoperative nausea and  vomiting). Past Surgical History Patient  has a past surgical history that includes Breast lumpectomy (Left, 2000); Breast biopsy (Left, 1999); Tonsilectomy/adenoidectomy with myringotomy; Abdominal hysterectomy; Tonsillectomy; and Open reduction internal fixation (orif) metacarpal (Left, 05/06/2020). Family History: Patient family history includes Alcohol abuse in her brother, father, and mother; Cancer in her brother and mother; Depression in her father; Early death in her father. Social History:  Patient  reports that she quit smoking about 50 years ago. Her smoking use included cigarettes. She has never used smokeless tobacco. She reports current alcohol use. She reports that she does not use drugs.  Review of Systems: Constitutional: negative for fever or malaise Ophthalmic: negative for photophobia, double vision or loss of vision Cardiovascular: negative for chest pain, dyspnea on exertion, or new LE swelling Respiratory: negative for SOB or persistent cough Gastrointestinal: negative for abdominal pain, change in bowel habits or melena Genitourinary: negative for dysuria or gross hematuria, no abnormal uterine bleeding or disharge Musculoskeletal: negative for new gait disturbance or muscular weakness Integumentary: negative for new or persistent rashes, no breast lumps Neurological: negative for TIA or stroke symptoms Psychiatric: negative for SI or delusions Allergic/Immunologic: negative for hives  Patient Care Team    Relationship Specialty Notifications  Start End  [name], MD PCP - General Family Medicine  11/02/19   [place], Jennefer Bravo, MD Referring Physician Dermatology  04/17/19   Jola Schmidt, MD Consulting Physician Ophthalmology  04/17/19   Dr. Marrianne Mood, DDS Consulting Physician Dentistry  04/17/19   Lonzo Candy, AUD Consulting Physician Audiology  04/15/20   Lyndal Pulley, DO Consulting Physician Sports Medicine  04/15/20     Objective  Vitals:  BP 110/70   Pulse 61   Temp 98.7 F (37.1 C)   Ht '5\' 4"'$  (1.626 m)   Wt 118 lb 3.2 oz (53.6 kg)   SpO2 97%   BMI 20.29 kg/m  General:  Well developed, well nourished, no acute distress  Psych:  Alert and orientedx3,normal mood and affect HEENT:  Normocephalic, atraumatic, non-icteric sclera,  supple neck without adenopathy, mass or thyromegaly Cardiovascular:  Normal S1, S2, RRR without gallop, rub or murmur Respiratory:  Good breath sounds bilaterally, CTAB with normal respiratory effort Gastrointestinal: normal bowel sounds, soft, non-tender, no noted masses. No HSM MSK: no deformities, contusions. Joints are without erythema or swelling.  Skin:  Warm, no rashes or suspicious lesions noted Neurologic:    Mental status is normal. Gross motor and sensory exams are normal. Normal gait. No tremor  MMSE: 30/30 [place] side effects, risks, benefits, and alternatives for medications and treatment plan prescribed today were discussed, and the patient expressed understanding of the given instructions. Patient is instructed to call or message via MyChart if he/she has any questions or concerns regarding our treatment plan. No barriers to understanding were identified. We discussed Red Flag symptoms and signs in detail. Patient expressed understanding regarding what to do in case of urgent or emergency type symptoms.  Medication list was reconciled, printed and provided to the patient in AVS. Patient instructions and summary information was reviewed with the patient as documented in the AVS. This note was prepared with assistance of Dragon voice recognition software. Occasional wrong-word or sound-a-like substitutions may have occurred due to the inherent limitations of voice recognition software

## 2023-01-04 NOTE — Patient Instructions (Signed)

## 2023-01-04 NOTE — Progress Notes (Unsigned)
[facility] Phone: [phone] Subjective:   [name], am serving as a scribe for Dr. Hulan Saas.  I'm seeing this patient by the request  of:  [name], MD  CC: back and neck pain   [id number]  Genie Wenke is a 72 y.o. female coming in with complaint of back and neck pain. OMT 11/15/2022. Patient states that she had one episode with chest discomfort which seems to occur when she leans forward.   High potassium in labwork recently. Wonders if change in diet ie. Gatorade post work has influenced this value.   Medications patient has been prescribed: None  Taking:  WE CAN TEST BMET IF SHE WANTS         Reviewed prior external information including notes and imaging from previsou exam, outside providers and external EMR if available.   As well as notes that were available from care everywhere and other healthcare systems.  Past medical history, social, surgical and family history all reviewed in electronic medical record.  No pertanent information unless stated regarding to the chief complaint.   Past Medical History:  Diagnosis Date   Cancer ([place])    Chicken pox    Depression    Diverticulitis    Lumbar herniated disc    Osteoporosis of lumbar spine 04/01/2020   DEXA 02/2018 Osteopenia T = -1.9 femur, T = -2.2  (T1 = -3.2, T2= -2.5) at lumbar spine; was started on fosamax at that time. DEXA 03/2020 Osteoporosis T = -2.5 at L1-2, lowest, femurs osteopenia, on fosamax. Continue fosamax.    Personal history of chemotherapy    Personal history of radiation therapy    PONV (postoperative nausea and vomiting)     Allergies  Allergen Reactions   Penicillins     Childhood allergy   Sulfa Antibiotics Hives and Swelling     Review of Systems:  No headache, visual changes, nausea, vomiting, diarrhea, constipation, dizziness, abdominal pain, skin rash, fevers, chills, night  sweats, weight loss, swollen lymph nodes, body aches, joint swelling, chest pain, shortness of breath, mood changes. POSITIVE muscle aches  Objective  Blood pressure 106/68, pulse 63, height '5\' 4"'$  (1.626 m), weight 120 lb (54.4 kg), SpO2 98 %.   General: No apparent distress alert and oriented x3 mood and affect normal, dressed appropriately.  HEENT: Pupils equal, extraocular movements intact  Respiratory: Patient's speak in full sentences and does not appear short of breath  Cardiovascular: No lower extremity edema, non tender, no erythema  Right back exam does have tightness noted.  Patient still has some tightness with straight leg test right greater than left.  Tightness with Corky Sox on the right greater than left as well.  Some limited internal range of motion of the right hip.  Tender to palpation laterally over the sacroiliac joints as well on the right side.  Osteopathic findings  C6 flexed rotated and side bent left T6 extended rotated and side bent left  inhaled rib T9 extended rotated and side bent left L1 flexed rotated and side bent right L5 flexed rotated and side bent left Sacrum right on right       Assessment and Plan:  Lumbar herniated disc Patient continues to have some chronic tightness today.  Discussed posture and ergonomics, discussed core strengthening.  Discussed which activities to do and which ones to avoid.  Patient will continue to stay active.  Discussed avoiding certain activities.  Follow-up  again in 6 to 8 weeks.    Nonallopathic problems  Decision today to treat with OMT was based on Physical Exam  After verbal consent patient was treated with HVLA, ME, FPR techniques in cervical, rib, thoracic, lumbar, and sacral  areas  Patient tolerated the procedure well with improvement in symptoms  Patient given exercises, stretches and lifestyle modifications  See medications in patient instructions if given  Patient will follow up in 4-8 weeks      The above documentation has been reviewed and is accurate and complete Lyndal Pulley, DO         Note: This dictation was prepared with Dragon dictation along with smaller phrase technology. Any transcriptional errors that result from this process are unintentional.

## 2023-01-07 ENCOUNTER — Encounter: Payer: Self-pay | Admitting: Family Medicine

## 2023-01-07 ENCOUNTER — Other Ambulatory Visit: Payer: Self-pay

## 2023-01-07 DIAGNOSIS — E875 Hyperkalemia: Secondary | ICD-10-CM

## 2023-01-07 LAB — QUANTIFERON-TB GOLD PLUS
Mitogen-NIL: 9.99 IU/mL
NIL: 0.04 IU/mL
QuantiFERON-TB Gold Plus: NEGATIVE
TB1-NIL: 0 IU/mL
TB2-NIL: 0 IU/mL

## 2023-01-07 NOTE — Progress Notes (Signed)
Please call patient: I have reviewed his/her lab results. Potassium is high and I suspect lab error: we have reported the problem but still need to have her return for bmp. Please schedule. Other labs are fine including neg tb screen and paperwork is being faxed today.

## 2023-01-10 ENCOUNTER — Encounter: Payer: Self-pay | Admitting: Family Medicine

## 2023-01-10 ENCOUNTER — Ambulatory Visit (INDEPENDENT_AMBULATORY_CARE_PROVIDER_SITE_OTHER): Payer: Medicare Other | Admitting: Family Medicine

## 2023-01-10 VITALS — BP 106/68 | HR 63 | Ht 64.0 in | Wt 120.0 lb

## 2023-01-10 DIAGNOSIS — M9904 Segmental and somatic dysfunction of sacral region: Secondary | ICD-10-CM

## 2023-01-10 DIAGNOSIS — M9908 Segmental and somatic dysfunction of rib cage: Secondary | ICD-10-CM | POA: Diagnosis not present

## 2023-01-10 DIAGNOSIS — M9901 Segmental and somatic dysfunction of cervical region: Secondary | ICD-10-CM

## 2023-01-10 DIAGNOSIS — M9902 Segmental and somatic dysfunction of thoracic region: Secondary | ICD-10-CM

## 2023-01-10 DIAGNOSIS — E875 Hyperkalemia: Secondary | ICD-10-CM | POA: Diagnosis not present

## 2023-01-10 DIAGNOSIS — M9903 Segmental and somatic dysfunction of lumbar region: Secondary | ICD-10-CM

## 2023-01-10 DIAGNOSIS — M2550 Pain in unspecified joint: Secondary | ICD-10-CM

## 2023-01-10 DIAGNOSIS — M5126 Other intervertebral disc displacement, lumbar region: Secondary | ICD-10-CM

## 2023-01-10 LAB — COMPREHENSIVE METABOLIC PANEL
ALT: 15 U/L (ref 0–35)
AST: 25 U/L (ref 0–37)
Albumin: 4.2 g/dL (ref 3.5–5.2)
Alkaline Phosphatase: 31 U/L — ABNORMAL LOW (ref 39–117)
BUN: 17 mg/dL (ref 6–23)
CO2: 30 mEq/L (ref 19–32)
Calcium: 9.8 mg/dL (ref 8.4–10.5)
Chloride: 101 mEq/L (ref 96–112)
Creatinine, Ser: 0.88 mg/dL (ref 0.40–1.20)
GFR: 66.09 mL/min (ref >60.00)
Glucose, Bld: 75 mg/dL (ref 70–99)
Potassium: 4.9 mEq/L (ref 3.5–5.1)
Sodium: 137 mEq/L (ref 135–145)
Total Bilirubin: 0.5 mg/dL (ref 0.2–1.2)
Total Protein: 6.8 g/dL (ref 6.0–8.3)

## 2023-01-10 NOTE — Assessment & Plan Note (Signed)
Patient had a value of 5.7 previously and was scheduled to have another be met ordered by primary care.  Because patient is here today we will just order it.  Send this note to primary care to follow-up on.  Lab Results  Component Value Date   K 4.9 01/10/2023

## 2023-01-10 NOTE — Patient Instructions (Addendum)
Good to see you Labs today See me in 2 months

## 2023-01-10 NOTE — Assessment & Plan Note (Signed)
Patient continues to have some chronic tightness today.  Discussed posture and ergonomics, discussed core strengthening.  Discussed which activities to do and which ones to avoid.  Patient will continue to stay active.  Discussed avoiding certain activities.  Follow-up again in 6 to 8 weeks.

## 2023-01-11 ENCOUNTER — Other Ambulatory Visit: Payer: Medicare Other

## 2023-01-22 ENCOUNTER — Other Ambulatory Visit: Payer: Self-pay | Admitting: Family Medicine

## 2023-02-22 ENCOUNTER — Ambulatory Visit
Admission: RE | Admit: 2023-02-22 | Discharge: 2023-02-22 | Disposition: A | Discharge status: Home / Self Care | Payer: Medicare Other | Source: Ambulatory Visit | Attending: Family Medicine | Admitting: Family Medicine

## 2023-02-22 DIAGNOSIS — Z1231 Encounter for screening mammogram for malignant neoplasm of breast: Secondary | ICD-10-CM | POA: Diagnosis not present

## 2023-03-14 NOTE — Progress Notes (Unsigned)
[facility] Phone: [phone] Subjective:   Allison [name], am serving as a scribe for Dr. Hulan [name].  I'm seeing this patient by the request  of:  Allison Arnt, MD  CC: Neck and lower back follow-up  [id number]  Allison [name] is a 72 y.o. female coming in with complaint of back and neck pain. OMT 01/10/2023. Patient states that she is doing good. Has been moving and is experiencing stiffness in back and anterior rib pain. Rib pain will wake her up at night. [name] lumbar spine pain at this time but she is having pain in L shin which is the precursor to her back pain.   Medications patient has been prescribed: None  Taking:         Reviewed prior external information including notes and imaging from previsou exam, outside providers and external EMR if available.   As well as notes that were available from care everywhere and other healthcare systems.  Past medical history, social, surgical and family history all reviewed in electronic medical record.  [name]   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 DEXA 03/2020 Osteoporosis T = -2.5 at L1-2, lowest, femurs osteopenia, on fosamax. Continue fosamax.    Personal history of chemotherapy    Personal history of radiation therapy    PONV (postoperative nausea and vomiting)     Allergies  Allergen Reactions   Penicillins     Childhood allergy   Sulfa Antibiotics Hives and Swelling     Review of Systems:  [name], visual changes, nausea, vomiting, diarrhea, constipation, dizziness, abdominal pain, skin rash, fevers,  chills, night sweats, weight loss, swollen lymph nodes, body aches, joint swelling, chest pain, shortness of breath, mood changes. POSITIVE muscle aches  Objective  Blood pressure 108/82, pulse 61, height 5\' 4"  (1.626 m), weight 117 lb (53.1 kg), SpO2 98 %.   General: [name], dressed appropriately.  HEENT: Pupils equal, extraocular movements intact  Respiratory: Patient's speak in full sentences and does not appear short of breath  Cardiovascular: [name], non tender, [name]  Low back exam with tightness noted in the paraspinal musculature of the lumbar spine than usual.  Seems to be in the thoracolumbar junction and some of the sacroiliac joint.  Osteopathic findings  C3 flexed rotated and side bent right T7 extended rotated and side bent right inhaled rib T11 extended rotated and side bent left L1 flexed rotated and side bent right Sacrum right on right       Assessment and Plan:  Lumbar herniated disc Likely patient is responding relatively well.  Did move boxes and will make some provide patient did have some worsening discomfort and pain.  Discussed icing regimen and home exercises, which activities to do and which ones to avoid.  Increase activity slowly otherwise.  Follow-up again in sooner intervals 6 to 8 weeks.    Nonallopathic problems  Decision today to treat with OMT was based on Physical Exam  After  verbal consent patient was treated with HVLA, ME, FPR techniques in cervical, rib, thoracic, lumbar, and sacral  areas  Patient tolerated the procedure well with improvement in symptoms  Patient given exercises, stretches and lifestyle modifications  See medications in patient instructions if given  Patient will follow up in 4-8 weeks    The above documentation has been reviewed and is accurate and complete Allison Pulley, DO          Note: This dictation was prepared with  Dragon dictation along with smaller phrase technology. Any transcriptional errors that result from this process are unintentional.

## 2023-03-15 ENCOUNTER — Encounter: Payer: Self-pay | Admitting: Family Medicine

## 2023-03-15 ENCOUNTER — Ambulatory Visit (INDEPENDENT_AMBULATORY_CARE_PROVIDER_SITE_OTHER): Payer: Medicare Other | Admitting: Family Medicine

## 2023-03-15 VITALS — BP 108/82 | HR 61 | Ht 64.0 in | Wt 117.0 lb

## 2023-03-15 DIAGNOSIS — M9908 Segmental and somatic dysfunction of rib cage: Secondary | ICD-10-CM | POA: Diagnosis not present

## 2023-03-15 DIAGNOSIS — M9902 Segmental and somatic dysfunction of thoracic region: Secondary | ICD-10-CM

## 2023-03-15 DIAGNOSIS — M9901 Segmental and somatic dysfunction of cervical region: Secondary | ICD-10-CM | POA: Diagnosis not present

## 2023-03-15 DIAGNOSIS — M9903 Segmental and somatic dysfunction of lumbar region: Secondary | ICD-10-CM | POA: Diagnosis not present

## 2023-03-15 DIAGNOSIS — M9904 Segmental and somatic dysfunction of sacral region: Secondary | ICD-10-CM

## 2023-03-15 DIAGNOSIS — M5126 Other intervertebral disc displacement, lumbar region: Secondary | ICD-10-CM | POA: Diagnosis not present

## 2023-03-15 NOTE — Patient Instructions (Signed)
Great to see you Tell husband to unpack some boxes See me in 7 weeks

## 2023-03-15 NOTE — Assessment & Plan Note (Signed)
Likely patient is responding relatively well.  Did move boxes and will make some provide patient did have some worsening discomfort and pain.  Discussed icing regimen and home exercises, which activities to do and which ones to avoid.  Increase activity slowly otherwise.  Follow-up again in sooner intervals 6 to 8 weeks.

## 2023-03-20 ENCOUNTER — Encounter: Payer: Self-pay | Admitting: Family Medicine

## 2023-03-21 ENCOUNTER — Other Ambulatory Visit: Payer: Self-pay

## 2023-03-21 DIAGNOSIS — F341 Dysthymic disorder: Secondary | ICD-10-CM

## 2023-03-21 MED ORDER — SERTRALINE HCL 25 MG PO TABS: 25.0000 mg | ORAL_TABLET | Freq: Every day | ORAL | 3 refills | Status: DC

## 2023-03-23 DIAGNOSIS — Z853 Personal history of malignant neoplasm of breast: Secondary | ICD-10-CM | POA: Diagnosis not present

## 2023-03-23 DIAGNOSIS — Z08 Encounter for follow-up examination after completed treatment for malignant neoplasm: Secondary | ICD-10-CM | POA: Diagnosis not present

## 2023-04-21 NOTE — Progress Notes (Signed)
[facility] [address] [place] [address] Phone: [phone] Subjective:   Bruce Donath, am serving as a scribe for Dr. Antoine Primas.  I'm seeing this patient by the request  of:  Willow Ora, MD  CC: Back and neck pain follow-up  [id number]  Allison [name] is a 72 y.o. female coming in with complaint of back and neck pain. OMT 03/15/2023. Patient states that she is stiff today. Did some strength training yesterday. Had some L shin pain last week and feels like this is still connected to her back issues.   Medications patient has been prescribed: None  Taking:         Reviewed prior external information including notes and imaging from previsou exam, outside providers and external EMR if available.   As well as notes that were available from care everywhere and other healthcare systems.  Past medical history, social, surgical and family history all reviewed in electronic medical record.  No pertanent information unless stated regarding to the chief complaint.   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 DEXA 03/2020 Osteoporosis T = -2.5 at L1-2, lowest, femurs osteopenia, on fosamax. Continue fosamax.    Personal history of chemotherapy    Personal history of radiation therapy    PONV (postoperative nausea and vomiting)     Allergies  Allergen Reactions   Penicillins     Childhood allergy   Sulfa Antibiotics Hives and Swelling     Review of Systems:  No headache, visual changes, nausea, vomiting, diarrhea, constipation, dizziness, abdominal pain, skin rash, fevers, chills, night sweats, weight loss, swollen lymph nodes, body aches, joint swelling, chest pain,  shortness of breath, mood changes. POSITIVE muscle aches  Objective  Blood pressure 94/62, pulse 94, height 5\' 4"  (1.626 m), weight 119 lb (54 kg), SpO2 97 %.   General: No apparent distress alert and oriented x3 mood and affect normal, dressed appropriately.  HEENT: Pupils equal, extraocular movements intact  Respiratory: Patient's speak in full sentences and does not appear short of breath  Cardiovascular: No lower extremity edema, non tender, no erythema  Back exam does have some loss of lordosis noted.  Tightness noted in the paraspinal musculature of the lumbar spine.  Patient has some tightness in the right parascapular area as well.  Osteopathic findings  C5 flexed rotated and side bent left T3 extended rotated and side bent right inhaled rib T9 extended rotated and side bent left L2 flexed rotated and side bent right L5 flexed rotated and side bent left Sacrum right on right       Assessment and Plan:  Lumbar herniated disc Has been doing remarkably well.  Continue to monitor.  Discussed icing regimen and home exercises, which activities to do and which ones to avoid.  Increase activity slowly.  Patient will be going on a trip to [place] for a cycling trip.  Will see how patient responds and can come back if any exacerbation.  Follow-up with me again in 6 to 8 weeks otherwise.    Nonallopathic problems  Decision today to treat with OMT was based on Physical Exam  After verbal consent patient was treated with HVLA, ME, FPR techniques in cervical, rib, thoracic, lumbar, and sacral  areas  Patient tolerated the procedure well with improvement in symptoms  Patient given exercises, stretches and lifestyle modifications  See medications in patient instructions if given  Patient will follow up in 4-8 weeks             Note: This dictation was prepared with Dragon dictation along with smaller phrase technology. Any transcriptional errors that result from this  process are unintentional.

## 2023-04-27 ENCOUNTER — Encounter: Payer: Self-pay | Admitting: Family Medicine

## 2023-04-27 ENCOUNTER — Ambulatory Visit (INDEPENDENT_AMBULATORY_CARE_PROVIDER_SITE_OTHER): Payer: Medicare Other | Admitting: Family Medicine

## 2023-04-27 VITALS — BP 94/62 | HR 94 | Ht 64.0 in | Wt 119.0 lb

## 2023-04-27 DIAGNOSIS — M9908 Segmental and somatic dysfunction of rib cage: Secondary | ICD-10-CM

## 2023-04-27 DIAGNOSIS — M9904 Segmental and somatic dysfunction of sacral region: Secondary | ICD-10-CM

## 2023-04-27 DIAGNOSIS — M5126 Other intervertebral disc displacement, lumbar region: Secondary | ICD-10-CM

## 2023-04-27 DIAGNOSIS — M9903 Segmental and somatic dysfunction of lumbar region: Secondary | ICD-10-CM

## 2023-04-27 DIAGNOSIS — M9902 Segmental and somatic dysfunction of thoracic region: Secondary | ICD-10-CM

## 2023-04-27 DIAGNOSIS — M9901 Segmental and somatic dysfunction of cervical region: Secondary | ICD-10-CM | POA: Diagnosis not present

## 2023-04-27 NOTE — Assessment & Plan Note (Signed)
Has been doing remarkably well.  Continue to monitor.  Discussed icing regimen and home exercises, which activities to do and which ones to avoid.  Increase activity slowly.  Patient will be going on a trip to [place] for a cycling trip.  Will see how patient responds and can come back if any exacerbation.  Follow-up with me again in 6 to 8 weeks otherwise.

## 2023-04-27 NOTE — Patient Instructions (Signed)
Good to see you!  Kick butt in [place] See you again in 5 weeks

## 2023-05-18 ENCOUNTER — Encounter: Payer: Self-pay | Admitting: Family Medicine

## 2023-05-19 ENCOUNTER — Ambulatory Visit: Payer: Medicare Other | Admitting: Family Medicine

## 2023-05-20 ENCOUNTER — Encounter: Payer: Self-pay | Admitting: Family Medicine

## 2023-05-20 ENCOUNTER — Ambulatory Visit (INDEPENDENT_AMBULATORY_CARE_PROVIDER_SITE_OTHER): Payer: Medicare Other | Admitting: Family Medicine

## 2023-05-20 VITALS — BP 118/64 | HR 55 | Temp 98.1°F | Ht 64.0 in | Wt 114.4 lb

## 2023-05-20 DIAGNOSIS — R198 Other specified symptoms and signs involving the digestive system and abdomen: Secondary | ICD-10-CM

## 2023-05-20 LAB — CBC WITH DIFFERENTIAL/PLATELET
Basophils Absolute: 0 10*3/uL (ref 0.0–0.1)
Basophils Relative: 0.5 % (ref 0.0–3.0)
Eosinophils Absolute: 0.1 10*3/uL (ref 0.0–0.7)
Eosinophils Relative: 1.5 % (ref 0.0–5.0)
HCT: 38.8 % (ref 36.0–46.0)
Hemoglobin: 12.7 g/dL (ref 12.0–15.0)
Lymphocytes Relative: 26.1 % (ref 12.0–46.0)
Lymphs Abs: 1.2 10*3/uL (ref 0.7–4.0)
MCHC: 32.8 g/dL (ref 30.0–36.0)
MCV: 93.4 fl (ref 78.0–100.0)
Monocytes Absolute: 0.4 10*3/uL (ref 0.1–1.0)
Monocytes Relative: 8.3 % (ref 3.0–12.0)
Neutro Abs: 3 10*3/uL (ref 1.4–7.7)
Neutrophils Relative %: 63.6 % (ref 43.0–77.0)
Platelets: 348 10*3/uL (ref 150.0–400.0)
RBC: 4.15 Mil/uL (ref 3.87–5.11)
RDW: 14.1 % (ref 11.5–15.5)
WBC: 4.7 10*3/uL (ref 4.0–10.5)

## 2023-05-20 LAB — COMPREHENSIVE METABOLIC PANEL
ALT: 11 U/L (ref 0–35)
AST: 22 U/L (ref 0–37)
Albumin: 4 g/dL (ref 3.5–5.2)
Alkaline Phosphatase: 32 U/L — ABNORMAL LOW (ref 39–117)
BUN: 14 mg/dL (ref 6–23)
CO2: 27 mEq/L (ref 19–32)
Calcium: 10 mg/dL (ref 8.4–10.5)
Chloride: 98 mEq/L (ref 96–112)
Creatinine, Ser: 0.8 mg/dL (ref 0.40–1.20)
GFR: 73.91 mL/min (ref >60.00)
Glucose, Bld: 86 mg/dL (ref 70–99)
Potassium: 4.5 mEq/L (ref 3.5–5.1)
Sodium: 135 mEq/L (ref 135–145)
Total Bilirubin: 0.7 mg/dL (ref 0.2–1.2)
Total Protein: 6.3 g/dL (ref 6.0–8.3)

## 2023-05-20 LAB — TSH: TSH: 1.17 u[IU]/mL (ref 0.35–5.50)

## 2023-05-20 NOTE — Progress Notes (Signed)
Subjective  CC:  Chief Complaint  Patient presents with   Diarrhea    Pt recently moved into a retirement community and meals are prepared. Diarrhea has been going on for the past month    HPI: Allison [name] is a 72 y.o. female who presents to the office today to address the problems listed above in the chief complaint. 72 year old female, very healthy reports due to change in bowel movement consistency.  In February she moved to [facility].  Meals are now prepared now for her.  She typically eats a very healthy diet.  About 6 weeks ago she had an episode of explosive watery diarrhea.  Her husband had a similar experience.  Symptoms did not last long.  However, about a month ago she noted some soft brown stool.  Typically, she moves her bowels every 2 days, the stool is firm, sometimes small pellets.  Never complains of abdominal bloating, hard to pass stool, melena.  However over the last month she will have changes in the stool consistency.  At times it is soft and brown.  She denies loose watery stool, mucus in the stool, blood in the stool, abdominal bloating or cramping.  She has not found a pattern to the changes.  She eats lots of fiber and grains, veggies and berries.  She otherwise feels well without fevers chills or epigastric pain.  She has had no changes in her medications.  No symptoms of hyperthyroidism.  Colorectal cancer screens are current and normal.  Her last was in August 2016.  Her weight is stable.  Her appetite is stable.  She continues to be athletic.  2 days ago she had an soft stool, however since they have been back to normal.  She has had diverticulosis on a colonoscopy in the past but no acute diverticulitis.  Assessment  1. Change in bowel movement      Plan  Changes in bowel movement consistency: We had a long discussion.  There are no red flag symptoms.  Her abdominal exam is benign.  Vital signs are stable.  I suspect changes in bowel  consistencies are related to dietary changes and or bacterial flora changes from possible viral infection 6 weeks ago.  There are no current signs of infection.  To be sure, we will check lab work and stool infectious panel.  I recommend using a probiotic for the next month.  Monitor diet and bowel changes.  Follow-up if persist or worsen  Follow up: As needed 05/30/2023  Orders Placed This Encounter  Procedures   Clostridium difficile Toxin B, Qualitative, Real-Time PCR   Gastrointestinal Pathogen Pnl RT, PCR   Comprehensive metabolic panel   CBC with Differential/Platelet   TSH   No orders of the defined types were placed in this encounter.     I reviewed the patients updated PMH, FH, and SocHx.    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    Priority: Medium    Lumbar herniated disc 03/23/2019    Priority: Medium    Chronic allergic rhinitis 01/04/2023    Priority: Low   Sensorineural hearing loss (SNHL) of both ears 01/04/2022    Priority: Low   Metatarsalgia of left  foot 11/02/2019    Priority: Low   Abdominal pain, chronic, epigastric 04/14/2022   Nonallopathic lesion of lumbosacral region 10/16/2019   Nonallopathic lesion of sacral region 10/16/2019   Nonallopathic lesion of thoracic region 10/16/2019   Current Meds  Medication Sig   alendronate (FOSAMAX) 70 MG tablet TAKE 1 TABLET WEEKLY ON AN EMPTY STOMACH WITH A FULL  GLASS OF WATER.   Calcium Citrate-Vitamin D (CALCIUM + D PO) Take by mouth.   cetirizine (ZYRTEC ALLERGY) 10 MG tablet Take 1 tablet (10 mg total) by mouth at bedtime.   diclofenac sodium (VOLTAREN) 1 % GEL Apply 2 grams topically to affected area qid   fluticasone (FLONASE) 50 MCG/ACT nasal spray Place 1 spray into both nostrils daily.    gabapentin (NEURONTIN) 100 MG capsule TAKE 2 CAPSULES BY MOUTH AT BEDTIME   MAGNESIUM PO Take by mouth.   meloxicam (MOBIC) 15 MG tablet Take 1 tablet (15 mg total) by mouth daily.   NON FORMULARY Tart cherry extract   ondansetron (ZOFRAN) 4 MG tablet Take 1 tablet (4 mg total) by mouth every 8 (eight) hours as needed for nausea or vomiting.   sertraline (ZOLOFT) 25 MG tablet Take 1 tablet (25 mg total) by mouth daily.   Turmeric 500 MG TABS Take by mouth.   Vitamin D, Cholecalciferol, 10 MCG (400 UNIT) TABS SMARTSIG:1 Tablet(s) By Mouth    Allergies: Patient is allergic to penicillins and sulfa antibiotics. Family History: Patient family history includes Alcohol abuse in her brother, father, and mother; Cancer in her brother and mother; Depression in her father; Early death in her father. Social History:  Patient  reports that she quit smoking about 50 years ago. Her smoking use included cigarettes. She has never used smokeless tobacco. She reports current alcohol use. She reports that she does not use drugs.  Review of Systems: Constitutional: Negative for fever malaise or anorexia Cardiovascular: negative for chest pain Respiratory: negative for SOB or persistent cough Gastrointestinal: negative for abdominal pain  Objective  Vitals: BP 118/64   Pulse (!) 55   Temp 98.1 F (36.7 C)   Ht 5\' 4"  (1.626 m)   Wt 114 lb 6.4 oz (51.9 kg)   SpO2 98%   BMI 19.64 kg/m  General: no acute distress , A&Ox3 HEENT: PEERL, conjunctiva normal, neck is supple Cardiovascular:  RRR without murmur or gallop.  Respiratory:  Good breath sounds bilaterally, CTAB with normal respiratory effort Gastrointestinal: soft, flat abdomen, normal active bowel sounds, no palpable masses, no hepatosplenomegaly, no appreciated hernias Nontender Skin:  Warm, no rashes  Commons side effects, risks, benefits, and alternatives for medications and treatment plan prescribed today were discussed, and the patient  expressed understanding of the given instructions. Patient is instructed to call or message via MyChart if he/she has any questions or concerns regarding our treatment plan. No barriers to understanding were identified. We discussed Red Flag symptoms and signs in detail. Patient expressed understanding regarding what to do in case of urgent or emergency type symptoms.  Medication list was reconciled, printed and provided to the patient in AVS. Patient instructions and summary information was reviewed with the patient as documented in the AVS. This note was prepared with assistance of Dragon voice recognition software. Occasional wrong-word or sound-a-like substitutions may have occurred due to the inherent limitations of voice recognition software

## 2023-05-20 NOTE — Patient Instructions (Signed)
Please follow up if symptoms do not improve or as needed.    Probiotics Probiotics are the good bacteria and yeasts that live in your body and keep your digestive system healthy. Probiotics also help your body's defense system (immune system) and protect your body against the growth of harmful bacteria. Your health care provider may recommend taking a probiotic if you are taking antibiotic medicine or have certain medical conditions, such as: Diarrhea. Constipation. Irritable bowel syndrome. Lung infections. Yeast infections. Acne, eczema, and other skin conditions. Frequent urinary tract infections. What affects the balance of bacteria in my body? The balance of good bacteria in your body can be affected by: Antibiotics. These medicines treat infections caused by bacteria. Unfortunately, they may kill the good bacteria in your body as well as the bad bacteria. Certain medical conditions. Conditions related to an imbalance of bacteria include: Stomach and intestine (gastrointestinal) infections. Lung infections. Skin infections. Vaginal infections. Inflammatory bowel diseases. Stomach ulcers (gastric ulcers). Tooth decay and gum disease (periodontal disease). Stress. A low-fiber diet. What type of probiotic is right for me? Probiotics contain different types of bacteria (strains). Strains commonly found in probiotics include: Lactobacillus. Saccharomyces. Bifidobacterium. Specific strains have been shown to be more effective for certain health conditions. Ask your health care provider which strain or strains you should use and how often. Probiotics come in many different forms, strain combinations, and strengths. Some may need to be refrigerated. Always read the label for storage and usage instructions. Certain foods, such as yogurt, contain probiotics. Probiotics can also be bought as a supplement at a pharmacy, health food store, or grocery store. Talk to your health care provider  before starting any supplement. What are the side effects of probiotics? Some people have side effects when taking probiotics. Side effects are usually temporary and may include: Gas. Bloating. Cramping. Serious side effects are rare. Follow these instructions at home:  Eat foods high in fiber, such as whole grains, beans, and vegetables. These foods can help good bacteria grow. Avoid certain foods as told by your health care provider. If you are taking probiotics with antibiotic medicine, take your probiotics as told by your health care provider. You may have to take probiotics for several weeks. This is to help the growth of good bacteria in your gut. Where to find more information International Scientific Association for Probiotics and Prebiotics: isappscience.org Summary Probiotics are the good bacteria and yeasts that live in your body and keep you and your digestive system healthy. Certain foods, such as yogurt, contain probiotics. Probiotics can be taken as supplements. They can be bought at a pharmacy, health food store, or grocery store. They come in many different forms, strain combinations, and strengths. Be sure to talk with your health care provider before taking a probiotic supplement. This information is not intended to replace advice given to you by your health care provider. Make sure you discuss any questions you have with your health care provider. Document Revised: 09/30/2021 Document Reviewed: 09/30/2021 Elsevier Patient Education  2024 [employer].

## 2023-05-23 LAB — LAB REPORT - SCANNED: EGFR: 79

## 2023-05-24 NOTE — Progress Notes (Signed)
See my chart note.

## 2023-05-30 ENCOUNTER — Ambulatory Visit (INDEPENDENT_AMBULATORY_CARE_PROVIDER_SITE_OTHER): Payer: Medicare Other

## 2023-05-30 VITALS — Wt 114.0 lb

## 2023-05-30 DIAGNOSIS — Z0000 Encounter for general adult medical examination without abnormal findings: Secondary | ICD-10-CM

## 2023-05-30 NOTE — Progress Notes (Signed)
I connected with  Allison [name] on 05/30/23 by a audio enabled telemedicine application and verified that I am speaking with the correct person using two identifiers.  Patient Location: Home  Provider Location: Office/Clinic  I discussed the limitations of evaluation and management by telemedicine. The patient expressed understanding and agreed to proceed.   Patient Medicare AWV questionnaire was completed by the patient on 05/27/23; I have confirmed that all information answered by patient is correct and no changes since this date.      Subjective:   Allison [name] is a 72 y.o. female who presents for Medicare Annual (Subsequent) preventive examination.  Review of Systems     Cardiac Risk Factors include: advanced age (>53men, >36 women)     Objective:    Today's Vitals   05/30/23 0834  Weight: 114 lb (51.7 kg)   Body mass index is 19.57 kg/m.     05/30/2023    8:39 AM 05/10/2022   10:23 AM 05/04/2021   10:32 AM 05/06/2020   10:42 AM 05/01/2020   12:04 PM 04/15/2020   10:22 AM  Advanced Directives  Does Patient Have a Medical Advance Directive? Yes Yes Yes Yes Yes Yes  Type of Estate agent of [place];Living will Healthcare Power of [place];Living will Healthcare Power of [employer] of [place];Living will  Living will;Healthcare Power of Attorney  Does patient want to make changes to medical advance directive?    No - Patient declined No - Patient declined No - Patient declined  Copy of Healthcare Power of Attorney in Chart? No - copy requested No - copy requested No - copy requested No - copy requested No - copy requested No - copy requested    Current Medications (verified) Outpatient Encounter Medications as of 05/30/2023  Medication Sig   alendronate (FOSAMAX) 70 MG tablet TAKE 1 TABLET WEEKLY ON AN EMPTY STOMACH WITH A FULL  GLASS OF WATER.   Calcium Citrate-Vitamin D (CALCIUM + D PO) Take by mouth.   cetirizine (ZYRTEC  ALLERGY) 10 MG tablet Take 1 tablet (10 mg total) by mouth at bedtime.   diclofenac sodium (VOLTAREN) 1 % GEL Apply 2 grams topically to affected area qid   fluticasone (FLONASE) 50 MCG/ACT nasal spray Place 1 spray into both nostrils daily.   gabapentin (NEURONTIN) 100 MG capsule TAKE 2 CAPSULES BY MOUTH AT BEDTIME   Krill Oil 1000 MG CAPS    MAGNESIUM PO Take by mouth.   meloxicam (MOBIC) 15 MG tablet Take 1 tablet (15 mg total) by mouth daily.   Multiple Vitamins-Minerals (MULTIVIT/MULTIMINERAL ADULT PO) Take by mouth.   NON FORMULARY Tart cherry extract   ondansetron (ZOFRAN) 4 MG tablet Take 1 tablet (4 mg total) by mouth every 8 (eight) hours as needed for nausea or vomiting.   sertraline (ZOLOFT) 25 MG tablet Take 1 tablet (25 mg total) by mouth daily.   Turmeric 500 MG TABS Take by mouth.   Vitamin D, Cholecalciferol, 10 MCG (400 UNIT) TABS SMARTSIG:1 Tablet(s) By Mouth   No facility-administered encounter medications on file as of 05/30/2023.    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 DEXA 03/2020 Osteoporosis T = -2.5 at L1-2, lowest, femurs osteopenia, on fosamax. Continue fosamax.    Personal history of chemotherapy    Personal history of radiation therapy    PONV (postoperative nausea and vomiting)    Past Surgical History:  Procedure Laterality Date   ABDOMINAL HYSTERECTOMY     BREAST BIOPSY Left 1999   BREAST LUMPECTOMY Left 2000   OPEN REDUCTION INTERNAL FIXATION (ORIF) METACARPAL Left 05/06/2020   Procedure: OPEN REDUCTION INTERNAL FIXATION (ORIF) LEFT THUMB METACARPAL FRACTURE;  Surgeon: Betha Loa, MD;  Location: [facility];   Service: Orthopedics;  Laterality: Left;  block in preop   TONSILECTOMY/ADENOIDECTOMY WITH MYRINGOTOMY     TONSILLECTOMY     Family History  Problem Relation Age of Onset   Alcohol abuse Mother    Cancer Mother    Alcohol abuse Father    Depression Father    Early death Father    Alcohol abuse Brother    Cancer Brother    Breast cancer Neg Hx    Social History   Socioeconomic History   Marital status: Married    Spouse name: Not on file   Number of children: 0   Years of education: Not on file   Highest education level: Professional school degree (e.g., MD, DDS, DVM, JD)  Occupational History   Occupation: Retired     Comment: professor @ [employer]  Tobacco Use   Smoking status: Former    Types: Cigarettes    Quit date: 12/27/1972    Years since quitting: 50.4   Smokeless tobacco: Never  Vaping Use   Vaping Use: Never used  Substance and Sexual Activity   Alcohol use: Yes    Comment: socially   Drug use: Never   Sexual activity: Not on file  Other Topics Concern   Not on file  Social History Narrative   Pets:  2 cats adopted at the start of Covid    Social Determinants of Health   Financial Resource Strain: Low Risk  (05/27/2023)   Overall Financial Resource Strain (CARDIA)    Difficulty of Paying Living Expenses: Not hard at all  Food Insecurity: No Food Insecurity (05/27/2023)   Hunger Vital Sign    Worried About Running Out of Food in the Last Year: Never true    Ran Out of Food in the Last Year: Never true  Transportation Needs: No Transportation Needs (05/27/2023)   PRAPARE - Administrator, Civil Service (Medical): No    Lack of Transportation (Non-Medical): No  Physical Activity: Sufficiently Active (05/27/2023)   Exercise Vital Sign    Days of Exercise per Week: 6 days    Minutes of Exercise per Session: 60 min  Stress: No Stress Concern Present (05/27/2023)   [employer] of Occupational Health - Occupational  Stress Questionnaire    Feeling of Stress : Not at all  Social Connections: Moderately Integrated (05/27/2023)   Social Connection and Isolation Panel [NHANES]    Frequency of Communication with Friends and Family: Once a week    Frequency of Social Gatherings with Friends and Family: Once a week    Attends Religious Services: 1 to 4 times per year    Active Member of [employer] or Organizations: Yes    Attends Engineer, structural: More than 4 times per year    Marital Status: Married    Tobacco Counseling Counseling given: Not Answered  Clinical Intake:  Pre-visit preparation completed: Yes  Pain : No/denies pain     BMI - recorded: 19.57 Nutritional Status: BMI of 19-24  Normal Nutritional Risks: None Diabetes: No  How often do you need to have someone help you when you read instructions, pamphlets, or other written materials from your doctor or pharmacy?: 1 - Never  Diabetic?no  Interpreter Needed?: No  Information entered by :: Lanier Ensign, LPN   Activities of Daily Living    05/27/2023    9:01 AM  In your present state of health, do you have any difficulty performing the following activities:  Hearing? 0  Vision? 0  Difficulty concentrating or making decisions? 0  Walking or climbing stairs? 0  Dressing or bathing? 0  Doing errands, shopping? 0  Preparing Food and eating ? N  Using the Toilet? N  In the past six months, have you accidently leaked urine? N  Do you have problems with loss of bowel control? N  Managing your Medications? N  Managing your Finances? N  Housekeeping or managing your Housekeeping? N    Patient Care Team: Willow Ora, MD as PCP - General (Family Medicine) Sharyn Lull, Elvin So, MD as Referring Physician (Dermatology) Sinda Du, MD as Consulting Physician (Ophthalmology) Dr. Oneida Arenas, DDS as Consulting Physician (Dentistry) Royden Purl, AUD as Consulting Physician (Audiology) Judi Saa, DO  as Consulting Physician (Sports Medicine)  Indicate any recent Medical Services you may have received from other than [facility] providers in the past year (date may be approximate).     Assessment:   This is a routine wellness examination for Allison [name].  Hearing/Vision screen Hearing Screening - Comments:: Pt wears hearing aids  Vision Screening - Comments:: Pt follows up with Dr Sinda Du for annual eye exams   Dietary issues and exercise activities discussed: Current Exercise Habits: Home exercise routine, Type of exercise: stretching;strength training/weights;yoga;Other - see comments (ti che), Time (Minutes): 60, Frequency (Times/Week): 6, Weekly Exercise (Minutes/Week): 360   Goals Addressed             This Visit's Progress    Patient Stated       None at this time        Depression Screen    05/30/2023    8:38 AM 05/20/2023   10:50 AM 01/04/2023    8:30 AM 08/31/2022   11:02 AM 05/10/2022   10:22 AM 04/06/2022   10:01 AM 01/04/2022    1:26 PM  PHQ 2/9 Scores  PHQ - 2 Score 0 0 0 0 0 0 0  PHQ- 9 Score      0 3    Fall Risk    05/27/2023    9:01 AM 05/20/2023   10:50 AM 01/04/2023    8:30 AM 05/10/2022   10:25 AM 05/04/2021   10:33 AM  Fall Risk   Falls in the past year? 0 0 0 0 0  Number falls in past yr:  0 0 0 0  Injury with Fall? 0 0 0 0 0  Risk for fall due to : Impaired vision No Fall Risks No Fall Risks Impaired vision Impaired vision  Follow up Falls prevention discussed Falls evaluation completed Falls evaluation completed Falls prevention discussed Falls prevention discussed    FALL RISK PREVENTION PERTAINING TO THE HOME:  Any stairs in or around the home? No  If so, are there any without handrails? No  Home free of loose throw rugs in walkways, pet beds, electrical cords,  etc? Yes  Adequate lighting in your home to reduce risk of falls? Yes   ASSISTIVE DEVICES UTILIZED TO PREVENT FALLS:  Life alert? Yes  Use of a cane, walker or w/c? No  Grab bars in the  bathroom? Yes  Shower chair or bench in shower? Yes  Elevated toilet seat or a handicapped toilet? Yes   TIMED UP AND GO:  Was the test performed? No .   Cognitive Function:        05/30/2023    8:41 AM 05/10/2022   10:27 AM 05/04/2021   10:37 AM 04/15/2020   10:23 AM  6CIT Screen  What Year? 0 points 0 points 0 points 0 points  What month? 0 points 0 points 0 points 0 points  What time? 0 points 0 points  0 points  Count back from 20 0 points 0 points 0 points 0 points  Months in reverse 0 points 0 points 0 points 0 points  Repeat phrase 0 points 0 points 0 points 0 points  Total Score 0 points 0 points  0 points    Immunizations Immunization History  Administered Date(s) Administered   Fluad Quad(high Dose 65+) 09/04/2019, 09/04/2020, 10/07/2021, 09/10/2022   Influenza-Unspecified 02/07/2015, 09/02/2018   PFIZER(Purple Top)SARS-COV-2 Vaccination 01/15/2020, 02/05/2020, 09/30/2020   PNEUMOCOCCAL CONJUGATE-20 01/04/2022   Pfizer Covid-19 Vaccine Bivalent Booster 75yrs & up 04/22/2021   Pneumococcal-Unspecified 03/15/2018   Respiratory Syncytial Virus Vaccine,Recomb Aduvanted(Arexvy) 09/10/2022   Tdap 02/11/2016   Zoster Recombinat (Shingrix) 09/07/2011, 01/06/2019, 05/08/2019    TDAP status: Up to date  Flu Vaccine status: Up to date  Pneumococcal vaccine status: Up to date  Covid-19 vaccine status: Completed vaccines  Qualifies for Shingles Vaccine? Yes   Zostavax completed Yes   Shingrix Completed?: Yes  Screening Tests Health Maintenance  Topic Date Due   COVID-19 Vaccine (5 - 2023-24 season) 06/05/2023 (Originally 08/27/2022)   INFLUENZA VACCINE  07/28/2023   MAMMOGRAM  02/23/2024   Medicare Annual Wellness (AWV)  05/29/2024   DEXA SCAN  06/02/2024   Colonoscopy  12/07/2025   DTaP/Tdap/Td (2 - Td or Tdap) 02/10/2026   Pneumonia Vaccine 70+ Years old  Completed   Hepatitis C Screening  Completed   Zoster Vaccines- Shingrix  Completed   HPV VACCINES  Aged  Out    Health Maintenance  There are no preventive care reminders to display for this patient.   Colorectal cancer screening: Type of screening: Colonoscopy. Completed 12/08/15. Repeat every 10 years  Mammogram status: Completed 02/22/23. Repeat every year  Bone Density status: Completed 06/02/22. Results reflect: Bone density results: OSTEOPOROSIS. Repeat every 2 years.   Additional Screening:  Hepatitis C Screening:  Completed 09/04/19  Vision Screening: Recommended annual ophthalmology exams for early detection of glaucoma and other disorders of the eye. Is the patient up to date with their annual eye exam?  Yes  Who is the provider or what is the name of the office in which the patient attends annual eye exams? Dr Sinda Du  If pt is not established with a provider, would they like to be referred to a provider to establish care? No .   Dental Screening: Recommended annual dental exams for proper oral hygiene  Community Resource Referral / Chronic Care Management: CRR required this visit?  No   CCM required this visit?  No      Plan:     I have personally reviewed and noted the following in the patient's chart:   Medical and social history  Use of alcohol, tobacco or illicit drugs  Current medications and supplements including opioid prescriptions. Patient is not currently taking opioid prescriptions. Functional ability and status Nutritional status Physical activity Advanced directives List of other physicians Hospitalizations, surgeries, and ER visits in previous 12 months Vitals Screenings to include cognitive, depression, and falls Referrals and appointments  In addition, I have reviewed and discussed with patient certain preventive protocols, quality metrics, and best practice recommendations. A written personalized care plan for preventive services as well as general preventive health recommendations were provided to patient.     Marzella Schlein,  LPN   06/02/8937   Nurse Notes: none

## 2023-05-30 NOTE — Patient Instructions (Signed)
Allison [name] , Thank you for taking time to come for your Medicare Wellness Visit. I appreciate your ongoing commitment to your health goals. Please review the following plan we discussed and let me know if I can assist you in the future.   These are the goals we discussed:  Goals      Patient Stated     Stay healthy      Patient Stated     Stay healthy and active      Patient Stated     None at this time         This is a list of the screening recommended for you and due dates:  Health Maintenance  Topic Date Due   COVID-19 Vaccine (5 - 2023-24 season) 06/05/2023*   Flu Shot  07/28/2023   Mammogram  02/23/2024   Medicare Annual Wellness Visit  05/29/2024   DEXA scan (bone density measurement)  06/02/2024   Colon Cancer Screening  12/07/2025   DTaP/Tdap/Td vaccine (2 - Td or Tdap) 02/10/2026   Pneumonia Vaccine  Completed   Hepatitis C Screening  Completed   Zoster (Shingles) Vaccine  Completed   HPV Vaccine  Aged Out  *Topic was postponed. The date shown is not the original due date.    Advanced directives: Please bring a copy of your health care power of attorney and living will to the office at your convenience.  Conditions/risks identified: none at this time   Next appointment: Follow up in one year for your annual wellness visit    Preventive Care 65 Years and Older, Female Preventive care refers to lifestyle choices and visits with your health care provider that can promote health and wellness. What does preventive care include? A yearly physical exam. This is also called an annual well check. Dental exams once or twice a year. Routine eye exams. Ask your health care provider how often you should have your eyes checked. Personal lifestyle choices, including: Daily care of your teeth and gums. Regular physical activity. Eating a healthy diet. Avoiding tobacco and drug use. Limiting alcohol use. Practicing safe sex. Taking low-dose aspirin every  day. Taking vitamin and mineral supplements as recommended by your health care provider. What happens during an annual well check? The services and screenings done by your health care provider during your annual well check will depend on your age, overall health, lifestyle risk factors, and family history of disease. Counseling  Your health care provider may ask you questions about your: Alcohol use. Tobacco use. Drug use. Emotional well-being. Home and relationship well-being. Sexual activity. Eating habits. History of falls. Memory and ability to understand (cognition). Work and work Astronomer. Reproductive health. Screening  You may have the following tests or measurements: Height, weight, and BMI. Blood pressure. Lipid and cholesterol levels. These may be checked every 5 years, or more frequently if you are over 60 years old. Skin check. Lung cancer screening. You may have this screening every year starting at age 71 if you have a 30-pack-year history of smoking and currently smoke or have quit within the past 15 years. Fecal occult blood test (FOBT) of the stool. You may have this test every year starting at age 37. Flexible sigmoidoscopy or colonoscopy. You may have a sigmoidoscopy every 5 years or a colonoscopy every 10 years starting at age 22. Hepatitis C blood test. Hepatitis B blood test. Sexually transmitted disease (STD) testing. Diabetes screening. This is done by checking your blood sugar (glucose) after you have  not eaten for a while (fasting). You may have this done every 1-3 years. Bone density scan. This is done to screen for osteoporosis. You may have this done starting at age 63. Mammogram. This may be done every 1-2 years. Talk to your health care provider about how often you should have regular mammograms. Talk with your health care provider about your test results, treatment options, and if necessary, the need for more tests. Vaccines  Your health care  provider may recommend certain vaccines, such as: Influenza vaccine. This is recommended every year. Tetanus, diphtheria, and acellular pertussis (Tdap, Td) vaccine. You may need a Td booster every 10 years. Zoster vaccine. You may need this after age 89. Pneumococcal 13-valent conjugate (PCV13) vaccine. One dose is recommended after age 16. Pneumococcal polysaccharide (PPSV23) vaccine. One dose is recommended after age 59. Talk to your health care provider about which screenings and vaccines you need and how often you need them. This information is not intended to replace advice given to you by your health care provider. Make sure you discuss any questions you have with your health care provider. Document Released: 01/09/2016 Document Revised: 09/01/2016 Document Reviewed: 10/14/2015 Elsevier Interactive Patient Education  2017 [employer].  Fall Prevention in the Home Falls can cause injuries. They can happen to people of all ages. There are many things you can do to make your home safe and to help prevent falls. What can I do on the outside of my home? Regularly fix the edges of walkways and driveways and fix any cracks. Remove anything that might make you trip as you walk through a door, such as a raised step or threshold. Trim any bushes or trees on the path to your home. Use bright outdoor lighting. Clear any walking paths of anything that might make someone trip, such as rocks or tools. Regularly check to see if handrails are loose or broken. Make sure that both sides of any steps have handrails. Any raised decks and porches should have guardrails on the edges. Have any leaves, snow, or ice cleared regularly. Use sand or salt on walking paths during winter. Clean up any spills in your garage right away. This includes oil or grease spills. What can I do in the bathroom? Use night lights. Install grab bars by the toilet and in the tub and shower. Do not use towel bars as grab  bars. Use non-skid mats or decals in the tub or shower. If you need to sit down in the shower, use a plastic, non-slip stool. Keep the floor dry. Clean up any water that spills on the floor as soon as it happens. Remove soap buildup in the tub or shower regularly. Attach bath mats securely with double-sided non-slip rug tape. Do not have throw rugs and other things on the floor that can make you trip. What can I do in the bedroom? Use night lights. Make sure that you have a light by your bed that is easy to reach. Do not use any sheets or blankets that are too big for your bed. They should not hang down onto the floor. Have a firm chair that has side arms. You can use this for support while you get dressed. Do not have throw rugs and other things on the floor that can make you trip. What can I do in the kitchen? Clean up any spills right away. Avoid walking on wet floors. Keep items that you use a lot in easy-to-reach places. If you need to  reach something above you, use a strong step stool that has a grab bar. Keep electrical cords out of the way. Do not use floor polish or wax that makes floors slippery. If you must use wax, use non-skid floor wax. Do not have throw rugs and other things on the floor that can make you trip. What can I do with my stairs? Do not leave any items on the stairs. Make sure that there are handrails on both sides of the stairs and use them. Fix handrails that are broken or loose. Make sure that handrails are as long as the stairways. Check any carpeting to make sure that it is firmly attached to the stairs. Fix any carpet that is loose or worn. Avoid having throw rugs at the top or bottom of the stairs. If you do have throw rugs, attach them to the floor with carpet tape. Make sure that you have a light switch at the top of the stairs and the bottom of the stairs. If you do not have them, ask someone to add them for you. What else can I do to help prevent  falls? Wear shoes that: Do not have high heels. Have rubber bottoms. Are comfortable and fit you well. Are closed at the toe. Do not wear sandals. If you use a stepladder: Make sure that it is fully opened. Do not climb a closed stepladder. Make sure that both sides of the stepladder are locked into place. Ask someone to hold it for you, if possible. Clearly mark and make sure that you can see: Any grab bars or handrails. First and last steps. Where the edge of each step is. Use tools that help you move around (mobility aids) if they are needed. These include: Canes. Walkers. Scooters. Crutches. Turn on the lights when you go into a dark area. Replace any light bulbs as soon as they burn out. Set up your furniture so you have a clear path. Avoid moving your furniture around. If any of your floors are uneven, fix them. If there are any pets around you, be aware of where they are. Review your medicines with your doctor. Some medicines can make you feel dizzy. This can increase your chance of falling. Ask your doctor what other things that you can do to help prevent falls. This information is not intended to replace advice given to you by your health care provider. Make sure you discuss any questions you have with your health care provider. Document Released: 10/09/2009 Document Revised: 05/20/2016 Document Reviewed: 01/17/2015 Elsevier Interactive Patient Education  2017 [employer].

## 2023-05-31 NOTE — Progress Notes (Unsigned)
[facility] [address] [place] [address] Phone: [phone] Subjective:   INadine Counts, am serving as a scribe for Dr. Antoine Primas.  I'm seeing this patient by the request  of:  Willow Ora, MD  CC: Low back pain follow-up  [id number]  Aranda Dibble is a 72 y.o. female coming in with complaint of back and neck pain. OMT 04/27/2023. Patient states same per usual. Had of flare of that pain in her mid section. No new concerns.  Medications patient has been prescribed: None  Taking:         Reviewed prior external information including notes and imaging from previsou exam, outside providers and external EMR if available.   As well as notes that were available from care everywhere and other healthcare systems.  Past medical history, social, surgical and family history all reviewed in electronic medical record.  No pertanent information unless stated regarding to the chief complaint.   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 DEXA 03/2020 Osteoporosis T = -2.5 at L1-2, lowest, femurs osteopenia, on fosamax. Continue fosamax.    Personal history of chemotherapy    Personal history of radiation therapy    PONV (postoperative nausea and vomiting)     Allergies  Allergen Reactions   Penicillins     Childhood allergy   Sulfa Antibiotics Hives and Swelling     Review of Systems:  No headache, visual changes, nausea, vomiting, diarrhea, constipation, dizziness, abdominal pain, skin rash, fevers, chills, night sweats, weight loss, swollen lymph nodes, body aches, joint swelling, chest pain, shortness of breath, mood changes. POSITIVE muscle aches  Objective  Blood  pressure (!) 102/58, pulse 79, height 5\' 4"  (1.626 m), weight 117 lb (53.1 kg), SpO2 98 %.   General: No apparent distress alert and oriented x3 mood and affect normal, dressed appropriately.  HEENT: Pupils equal, extraocular movements intact  Respiratory: Patient's speak in full sentences and does not appear short of breath  Cardiovascular: No lower extremity edema, non tender, no erythema  Low back exam does have some loss of lordosis noted.  Worsening pain with extension of the back.  Negative radicular symptoms noted.  Patient has difficulty even with rotation of the back with discomfort in the thoracolumbar area.  Osteopathic findings  C4 flexed rotated and side bent left T3 extended rotated and side bent right inhaled rib T11 extended rotated and side bent left L1 flexed rotated and side bent right Sacrum right on right       Assessment and Plan:  Osteoporosis of lumbar spine Osteoporosis noted.  Discussed icing regimen and home exercises, discussed which activities to do and which ones to avoid.  Increase activity slowly.  Follow-up again in 6 to 8 weeks    Nonallopathic problems  Decision today to treat with OMT was based on Physical Exam  After verbal consent patient was treated with HVLA, ME, FPR techniques in cervical, rib, thoracic, lumbar, and sacral  areas  Patient tolerated the procedure well with improvement in symptoms  Patient given exercises, stretches and lifestyle modifications  See medications in patient instructions if given  Patient  will follow up in 4-8 weeks    The above documentation has been reviewed and is accurate and complete Judi Saa, DO          Note: This dictation was prepared with Dragon dictation along with smaller phrase technology. Any transcriptional errors that result from this process are unintentional.

## 2023-06-01 ENCOUNTER — Ambulatory Visit (INDEPENDENT_AMBULATORY_CARE_PROVIDER_SITE_OTHER): Payer: Medicare Other | Admitting: Family Medicine

## 2023-06-01 ENCOUNTER — Encounter: Payer: Self-pay | Admitting: Family Medicine

## 2023-06-01 ENCOUNTER — Ambulatory Visit (INDEPENDENT_AMBULATORY_CARE_PROVIDER_SITE_OTHER): Payer: Medicare Other

## 2023-06-01 VITALS — BP 102/58 | HR 79 | Ht 64.0 in | Wt 117.0 lb

## 2023-06-01 DIAGNOSIS — M810 Age-related osteoporosis without current pathological fracture: Secondary | ICD-10-CM

## 2023-06-01 DIAGNOSIS — M5126 Other intervertebral disc displacement, lumbar region: Secondary | ICD-10-CM | POA: Diagnosis not present

## 2023-06-01 DIAGNOSIS — M9908 Segmental and somatic dysfunction of rib cage: Secondary | ICD-10-CM | POA: Diagnosis not present

## 2023-06-01 DIAGNOSIS — Z853 Personal history of malignant neoplasm of breast: Secondary | ICD-10-CM | POA: Diagnosis not present

## 2023-06-01 DIAGNOSIS — M9903 Segmental and somatic dysfunction of lumbar region: Secondary | ICD-10-CM

## 2023-06-01 DIAGNOSIS — M546 Pain in thoracic spine: Secondary | ICD-10-CM

## 2023-06-01 DIAGNOSIS — M9904 Segmental and somatic dysfunction of sacral region: Secondary | ICD-10-CM | POA: Diagnosis not present

## 2023-06-01 DIAGNOSIS — M9902 Segmental and somatic dysfunction of thoracic region: Secondary | ICD-10-CM | POA: Diagnosis not present

## 2023-06-01 DIAGNOSIS — M9901 Segmental and somatic dysfunction of cervical region: Secondary | ICD-10-CM

## 2023-06-01 DIAGNOSIS — M47816 Spondylosis without myelopathy or radiculopathy, lumbar region: Secondary | ICD-10-CM | POA: Diagnosis not present

## 2023-06-01 DIAGNOSIS — M4316 Spondylolisthesis, lumbar region: Secondary | ICD-10-CM | POA: Diagnosis not present

## 2023-06-01 DIAGNOSIS — M545 Low back pain, unspecified: Secondary | ICD-10-CM | POA: Diagnosis not present

## 2023-06-01 NOTE — Patient Instructions (Addendum)
Xrays today Take Aleve 2x a day for 3 days Oncology referral See you again in 6-7 weeks

## 2023-06-01 NOTE — Assessment & Plan Note (Signed)
Osteoporosis noted.  Discussed icing regimen and home exercises, discussed which activities to do and which ones to avoid.  Increase activity slowly.  Follow-up again in 6 to 8 weeks

## 2023-06-01 NOTE — Assessment & Plan Note (Signed)
Worsening pain but has responded well to osteopathic manipulation in the past.  Discussed which activities to do and which ones to avoid.  Follow-up again in 6 to 8 weeks

## 2023-06-07 ENCOUNTER — Encounter: Payer: Self-pay | Admitting: Family Medicine

## 2023-06-10 DIAGNOSIS — D485 Neoplasm of uncertain behavior of skin: Secondary | ICD-10-CM | POA: Diagnosis not present

## 2023-06-10 DIAGNOSIS — D225 Melanocytic nevi of trunk: Secondary | ICD-10-CM | POA: Diagnosis not present

## 2023-06-10 DIAGNOSIS — Z85828 Personal history of other malignant neoplasm of skin: Secondary | ICD-10-CM | POA: Diagnosis not present

## 2023-06-10 DIAGNOSIS — L821 Other seborrheic keratosis: Secondary | ICD-10-CM | POA: Diagnosis not present

## 2023-06-26 ENCOUNTER — Encounter: Payer: Self-pay | Admitting: Family Medicine

## 2023-07-08 NOTE — Progress Notes (Signed)
[facility] [address] [place] [address] Phone: [phone] Subjective:   INadine Counts, am serving as a scribe for Dr. Antoine Primas.  I'm seeing this patient by the request  of:  Willow Ora, MD  CC: back and neck pain follow up   [id number]  Allison [name] is a 72 y.o. female coming in with complaint of back and neck pain. OMT on 06/01/2023. Patient states same per usual. No new concerns. Wants to talk about magnesium she's taking.  Medications patient has been prescribed:   Taking:         Reviewed prior external information including notes and imaging from previsou exam, outside providers and external EMR if available.   As well as notes that were available from care everywhere and other healthcare systems.  Past medical history, social, surgical and family history all reviewed in electronic medical record.  No pertanent information unless stated regarding to the chief complaint.   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 DEXA 03/2020 Osteoporosis T = -2.5 at L1-2, lowest, femurs osteopenia, on fosamax. Continue fosamax.    Personal history of chemotherapy    Personal history of radiation therapy    PONV (postoperative nausea and vomiting)     Allergies  Allergen Reactions   Penicillins     Childhood allergy   Sulfa Antibiotics Hives and Swelling     Review of Systems:  No headache, visual changes, nausea, vomiting, diarrhea, constipation, dizziness, abdominal pain, skin rash, fevers, chills, night sweats, weight loss, swollen lymph nodes, body aches, joint swelling, chest pain, shortness of breath, mood changes. POSITIVE muscle aches  Objective  Blood  pressure 116/74, pulse 70, height 5\' 4"  (1.626 m), weight 118 lb (53.5 kg), SpO2 97%.   General: No apparent distress alert and oriented x3 mood and affect normal, dressed appropriately.  HEENT: Pupils equal, extraocular movements intact  Respiratory: Patient's speak in full sentences and does not appear short of breath  Cardiovascular: No lower extremity edema, non tender, no erythema  Back exam shows the patient does have tenderness to palpation of the paraspinal musculature.  Patient does have tightness with FABER test right greater than left.  Some tenderness over the right sacroiliac joint.  Some mild increase in discomfort in the L4-L5 area with extension.  Osteopathic findings  C3 flexed rotated and side bent right C6 flexed rotated and side bent left T3 extended rotated and side bent right inhaled rib L2 flexed rotated and side bent right Sacrum right on right    Assessment and Plan:  Lumbar herniated disc Patient does have some tightness and pain in his patient does have some exercises.  Will continue to work on strength testing and strengthening.  Does have osteoporosis.    Nonallopathic problems  Decision today to treat with OMT was based on Physical Exam  After verbal consent patient was treated with HVLA, ME, FPR techniques in cervical, rib, thoracic, lumbar, and sacral  areas  Patient tolerated the procedure well with improvement in symptoms  Patient given exercises, stretches and lifestyle modifications  See medications in patient instructions if given  Patient will  follow up in 4-8 weeks    The above documentation has been reviewed and is accurate and complete Judi Saa, DO          Note: This dictation was prepared with Dragon dictation along with smaller phrase technology. Any transcriptional errors that result from this process are unintentional.

## 2023-07-13 ENCOUNTER — Encounter: Payer: Self-pay | Admitting: Family Medicine

## 2023-07-13 ENCOUNTER — Ambulatory Visit: Payer: Medicare Other | Admitting: Family Medicine

## 2023-07-13 VITALS — BP 116/74 | HR 70 | Ht 64.0 in | Wt 118.0 lb

## 2023-07-13 DIAGNOSIS — M9902 Segmental and somatic dysfunction of thoracic region: Secondary | ICD-10-CM

## 2023-07-13 DIAGNOSIS — M9901 Segmental and somatic dysfunction of cervical region: Secondary | ICD-10-CM

## 2023-07-13 DIAGNOSIS — M9903 Segmental and somatic dysfunction of lumbar region: Secondary | ICD-10-CM

## 2023-07-13 DIAGNOSIS — M5126 Other intervertebral disc displacement, lumbar region: Secondary | ICD-10-CM

## 2023-07-13 DIAGNOSIS — M9904 Segmental and somatic dysfunction of sacral region: Secondary | ICD-10-CM | POA: Diagnosis not present

## 2023-07-13 DIAGNOSIS — M9908 Segmental and somatic dysfunction of rib cage: Secondary | ICD-10-CM | POA: Diagnosis not present

## 2023-07-13 NOTE — Patient Instructions (Addendum)
Move calcium to dinner time  See you again in 7-8 weeks

## 2023-07-13 NOTE — Assessment & Plan Note (Signed)
Patient does have some tightness and pain in his patient does have some exercises.  Will continue to work on strength testing and strengthening.  Does have osteoporosis.

## 2023-09-07 NOTE — Progress Notes (Unsigned)
[facility] [address] [place] [address] Phone: [phone] Subjective:   Allison [name], am serving as a scribe for Dr. Antoine Primas.  I'm seeing this patient by the request  of:  Willow Ora, MD  CC: back and neck pain follow up   [id number]  Danitza Tomei is a 72 y.o. female coming in with complaint of back and neck pain. OMT on 07/13/2023. Patient states that she rode 100 miles on her bike this past weekend. Very stiff and sore in lower back. Chest pain during and after bike ride was in L pec. Rode mostly in the rain.   Medications patient has been prescribed:   Taking:         Reviewed prior external information including notes and imaging from previsou exam, outside providers and external EMR if available.   As well as notes that were available from care everywhere and other healthcare systems.  Past medical history, social, surgical and family history all reviewed in electronic medical record.  No pertanent information unless stated regarding to the chief complaint.   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 DEXA 03/2020 Osteoporosis T = -2.5 at L1-2, lowest, femurs osteopenia, on fosamax. Continue fosamax.    Personal history of chemotherapy    Personal history of radiation therapy    PONV (postoperative nausea and vomiting)     Allergies  Allergen Reactions   Penicillins     Childhood allergy   Sulfa Antibiotics Hives and Swelling     Review of Systems:  No headache, visual changes, nausea, vomiting, diarrhea, constipation, dizziness, abdominal pain, skin rash, fevers, chills, night sweats, weight loss, swollen lymph nodes, body aches, joint swelling,  chest pain, shortness of breath, mood changes. POSITIVE muscle aches  Objective  Pulse 64, height 5\' 4"  (1.626 m), weight 118 lb (53.5 kg).   General: No apparent distress alert and oriented x3 mood and affect normal, dressed appropriately.  HEENT: Pupils equal, extraocular movements intact  Respiratory: Patient's speak in full sentences and does not appear short of breath  Cardiovascular: No lower extremity edema, non tender, no erythema  Patient does have some scoliosis noted.  Some tenderness to palpation in the paraspinal musculature of the lumbar spine seems to be worse.  Osteopathic findings  C6 flexed rotated and side bent left T3 extended rotated and side bent right inhaled rib T9 extended rotated and side bent left L2 flexed rotated and side bent right L3 flexed rotated and side bent left L5 flexed rotated and side bent left Sacrum right on right       Assessment and Plan:  Lumbar herniated disc Discussed with patient again.  Has done relatively well with the conservative therapy.  Will continue to monitor otherwise.  Continues to have given 2 more strength and conditioning.  Will continue to increase activity as tolerated.  Follow-up with pain illness in 6 to 8 weeks as well.    Nonallopathic problems  Decision today to treat with OMT was based on Physical Exam  After verbal consent patient was treated with HVLA, ME, FPR techniques in cervical, rib, thoracic, lumbar, and  sacral  areas  Patient tolerated the procedure well with improvement in symptoms  Patient given exercises, stretches and lifestyle modifications  See medications in patient instructions if given  Patient will follow up in 4-8 weeks    The above documentation has been reviewed and is accurate and complete Judi Saa, DO          Note: This dictation was prepared with Dragon dictation along with smaller phrase technology. Any transcriptional errors that result from this process are  unintentional.

## 2023-09-08 ENCOUNTER — Encounter: Payer: Self-pay | Admitting: Family Medicine

## 2023-09-08 ENCOUNTER — Ambulatory Visit (INDEPENDENT_AMBULATORY_CARE_PROVIDER_SITE_OTHER): Payer: Medicare Other | Admitting: Family Medicine

## 2023-09-08 VITALS — HR 64 | Ht 64.0 in | Wt 118.0 lb

## 2023-09-08 DIAGNOSIS — M9908 Segmental and somatic dysfunction of rib cage: Secondary | ICD-10-CM | POA: Diagnosis not present

## 2023-09-08 DIAGNOSIS — M9901 Segmental and somatic dysfunction of cervical region: Secondary | ICD-10-CM

## 2023-09-08 DIAGNOSIS — M9902 Segmental and somatic dysfunction of thoracic region: Secondary | ICD-10-CM | POA: Diagnosis not present

## 2023-09-08 DIAGNOSIS — M9904 Segmental and somatic dysfunction of sacral region: Secondary | ICD-10-CM

## 2023-09-08 DIAGNOSIS — M5126 Other intervertebral disc displacement, lumbar region: Secondary | ICD-10-CM | POA: Diagnosis not present

## 2023-09-08 DIAGNOSIS — M9903 Segmental and somatic dysfunction of lumbar region: Secondary | ICD-10-CM

## 2023-09-08 NOTE — Assessment & Plan Note (Signed)
Discussed with patient again.  Has done relatively well with the conservative therapy.  Will continue to monitor otherwise.  Continues to have given 2 more strength and conditioning.  Will continue to increase activity as tolerated.  Follow-up with pain illness in 6 to 8 weeks as well.

## 2023-09-08 NOTE — Patient Instructions (Addendum)
Good to see you Congrats on the ride Keep watching and seeing if any association with breathing or food See me in 6-8 weeks

## 2023-09-20 ENCOUNTER — Other Ambulatory Visit (INDEPENDENT_AMBULATORY_CARE_PROVIDER_SITE_OTHER): Payer: Medicare Other

## 2023-09-20 ENCOUNTER — Encounter: Payer: Self-pay | Admitting: Family Medicine

## 2023-09-20 ENCOUNTER — Other Ambulatory Visit: Payer: Self-pay

## 2023-09-20 DIAGNOSIS — M2550 Pain in unspecified joint: Secondary | ICD-10-CM

## 2023-09-20 LAB — URINALYSIS
Bilirubin Urine: NEGATIVE
Hgb urine dipstick: NEGATIVE
Ketones, ur: NEGATIVE
Leukocytes,Ua: NEGATIVE
Nitrite: NEGATIVE
Specific Gravity, Urine: 1.015 (ref 1.000–1.030)
Total Protein, Urine: NEGATIVE
Urine Glucose: NEGATIVE
Urobilinogen, UA: 0.2 (ref 0.0–1.0)
pH: 7.5 (ref 5.0–8.0)

## 2023-09-20 MED ORDER — PREDNISONE 20 MG PO TABS: 20.0000 mg | ORAL_TABLET | Freq: Every day | ORAL | 0 refills | Status: DC

## 2023-09-21 ENCOUNTER — Other Ambulatory Visit: Payer: Self-pay

## 2023-09-21 MED ORDER — PREDNISONE 20 MG PO TABS: 20.0000 mg | ORAL_TABLET | Freq: Every day | ORAL | 0 refills | Status: DC

## 2023-09-29 DIAGNOSIS — Z23 Encounter for immunization: Secondary | ICD-10-CM | POA: Diagnosis not present

## 2023-09-30 DIAGNOSIS — H2513 Age-related nuclear cataract, bilateral: Secondary | ICD-10-CM | POA: Diagnosis not present

## 2023-09-30 DIAGNOSIS — H524 Presbyopia: Secondary | ICD-10-CM | POA: Diagnosis not present

## 2023-09-30 DIAGNOSIS — H04123 Dry eye syndrome of bilateral lacrimal glands: Secondary | ICD-10-CM | POA: Diagnosis not present

## 2023-10-19 NOTE — Progress Notes (Unsigned)
[facility] [address] [place] [address] Phone: [phone] Subjective:   Allison [name], am serving as a scribe for Dr. Antoine Primas.  I'm seeing this patient by the request  of:  Willow Ora, MD  CC: Back and neck pain follow-up  [id number]  Allison [name] is a 72 y.o. female coming in with complaint of back and neck pain. OMT on 09/08/2023. Patient states everything has been going pretty well. Played a little water volleyball and a bit stiff today.  Medications patient has been prescribed: Prednisone          Reviewed prior external information including notes and imaging from previsou exam, outside providers and external EMR if available.   As well as notes that were available from care everywhere and other healthcare systems.  Past medical history, social, surgical and family history all reviewed in electronic medical record.  No pertanent information unless stated regarding to the chief complaint.   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 DEXA 03/2020 Osteoporosis T = -2.5 at L1-2, lowest, femurs osteopenia, on fosamax. Continue fosamax.    Personal history of chemotherapy    Personal history of radiation therapy    PONV (postoperative nausea and vomiting)     Allergies  Allergen Reactions   Penicillins     Childhood allergy   Sulfa Antibiotics Hives and Swelling     Review of Systems:  No headache, visual changes, nausea, vomiting, diarrhea, constipation, dizziness, abdominal pain, skin rash, fevers, chills, night sweats, weight loss, swollen lymph nodes, body aches, joint swelling, chest pain, shortness of breath, mood changes. POSITIVE muscle  aches  Objective  Blood pressure 118/72, pulse 72, height 5\' 4"  (1.626 m), weight 118 lb (53.5 kg), SpO2 97%.   General: No apparent distress alert and oriented x3 mood and affect normal, dressed appropriately.  HEENT: Pupils equal, extraocular movements intact  Respiratory: Patient's speak in full sentences and does not appear short of breath  Cardiovascular: No lower extremity edema, non tender, no erythema  Back exam does have some loss lordosis noted.  Tightness noted in the right parascapular area.  Osteopathic findings t C6 flexed rotated and side bent left T3 extended rotated and side bent right inhaled rib T9 extended rotated and side bent left L2 flexed rotated and side bent right L5 flexed rotated and side bent left Sacrum right on right       Assessment and Plan:  Osteoporosis of lumbar spine Continue to monitor, does respond relatively well to osteopathic manipulation.  Will home exercises.  Patient will continue to work on strengthening as well as vitamin D supplementation.  Follow-up with me again in 2 to 3 months for further evaluation and treatment    Nonallopathic problems  Decision today to treat with OMT was based on Physical Exam  After verbal consent patient was treated with HVLA, ME, FPR techniques in cervical, rib, thoracic, lumbar, and sacral  areas  Patient tolerated the procedure well with improvement in symptoms  Patient given exercises, stretches and lifestyle modifications  See medications in patient instructions if given  Patient will follow up in 4-8  weeks     The above documentation has been reviewed and is accurate and complete Allison Saa, DO         Note: This dictation was prepared with Dragon dictation along with smaller phrase technology. Any transcriptional errors that result from this process are unintentional.

## 2023-10-20 ENCOUNTER — Ambulatory Visit: Payer: Medicare Other | Admitting: Family Medicine

## 2023-10-20 ENCOUNTER — Encounter: Payer: Self-pay | Admitting: Family Medicine

## 2023-10-20 VITALS — BP 118/72 | HR 72 | Ht 64.0 in | Wt 118.0 lb

## 2023-10-20 DIAGNOSIS — M9908 Segmental and somatic dysfunction of rib cage: Secondary | ICD-10-CM | POA: Diagnosis not present

## 2023-10-20 DIAGNOSIS — M9901 Segmental and somatic dysfunction of cervical region: Secondary | ICD-10-CM | POA: Diagnosis not present

## 2023-10-20 DIAGNOSIS — M9903 Segmental and somatic dysfunction of lumbar region: Secondary | ICD-10-CM | POA: Diagnosis not present

## 2023-10-20 DIAGNOSIS — M9902 Segmental and somatic dysfunction of thoracic region: Secondary | ICD-10-CM | POA: Diagnosis not present

## 2023-10-20 DIAGNOSIS — M810 Age-related osteoporosis without current pathological fracture: Secondary | ICD-10-CM

## 2023-10-20 DIAGNOSIS — M9904 Segmental and somatic dysfunction of sacral region: Secondary | ICD-10-CM

## 2023-10-20 MED ORDER — GABAPENTIN 100 MG PO CAPS: 200.0000 mg | ORAL_CAPSULE | Freq: Two times a day (BID) | ORAL | 0 refills | Status: DC

## 2023-10-20 NOTE — Patient Instructions (Addendum)
Gabapentin 200mg  2x a day Continue the exercises Stay active See you again in 2 months

## 2023-10-20 NOTE — Assessment & Plan Note (Addendum)
Continue to monitor, does respond relatively well to osteopathic manipulation.  Will home exercises.  Patient will continue to work on strengthening as well as vitamin D supplementation.  Follow-up with me again in 2 to 3 months for further evaluation and treatment increase gabapentin to 200 mg twice a day to allow for titration

## 2023-11-27 ENCOUNTER — Encounter: Payer: Self-pay | Admitting: Family Medicine

## 2023-11-28 ENCOUNTER — Other Ambulatory Visit: Payer: Self-pay

## 2023-11-28 DIAGNOSIS — M810 Age-related osteoporosis without current pathological fracture: Secondary | ICD-10-CM

## 2023-11-28 MED ORDER — ALENDRONATE SODIUM 70 MG PO TABS: ORAL_TABLET | ORAL | 3 refills | Status: DC

## 2023-12-09 ENCOUNTER — Ambulatory Visit (INDEPENDENT_AMBULATORY_CARE_PROVIDER_SITE_OTHER): Payer: Medicare Other | Admitting: Family Medicine

## 2023-12-09 ENCOUNTER — Encounter: Payer: Self-pay | Admitting: Family Medicine

## 2023-12-09 ENCOUNTER — Telehealth: Payer: Self-pay | Admitting: Family Medicine

## 2023-12-09 VITALS — BP 126/74 | HR 89 | Temp 99.2°F | Ht 64.0 in | Wt 121.0 lb

## 2023-12-09 DIAGNOSIS — B349 Viral infection, unspecified: Secondary | ICD-10-CM | POA: Diagnosis not present

## 2023-12-09 NOTE — Telephone Encounter (Signed)
Patient advised: See PCP within 24 hours  Patient is scheduled with PCP on 12/09/23  Patient Name First: Erskine Squibb Last: Mosetta Putt Gender: Female DOB: 15-Mar-1951 Age: 72 Y 4 M 22 D Return Phone Number: [phone] (Primary), [phone] (Secondary) Address: City/ State/ Zip: [place]  [address] Client [facility] - [facility], [facility] Provider Asencion Partridge- MD Contact Type Call Who Is Calling Patient / Member / Family / Caregiver Call Type Triage / Clinical Relationship To Patient Self Return Phone Number [phone] (Primary) Chief Complaint Dizziness Reason for Call Symptomatic / Request for Health Information Initial Comment Caller states she is experiencing cough (no blood), congestion, low grade fever 99.7 and dizziness. Translation No Nurse Assessment Nurse: Vear Clock, RN, Elease Hashimoto Date/Time Lamount Cohen Time): 12/09/2023 8:41:32 AM Confirm and document reason for call. If symptomatic, describe symptoms. ---She has had a deep cough, congestion , yellow phlegm. 99.9 o, and no difficulty breathing. She had a headache the last couple of days. Does the patient have any new or worsening symptoms? ---Yes Will a triage be completed? ---Yes Related visit to physician within the last 2 weeks? ---No Does the PT have any chronic conditions? (i.e. diabetes, asthma, this includes High risk factors for pregnancy, etc.) ---No Is this a behavioral health or substance abuse call? ---No Guidelines Guideline Title Affirmed Question Affirmed Notes Nurse Date/Time Lamount Cohen Time) Sinus Pain or Congestion Toma Deiters, RN, Elease Hashimoto 12/09/2023 8:43:38 AM Disp. Time Lamount Cohen Time) Disposition Final User 12/09/2023 8:45:50 AM See PCP within 24 Hours Yes Vear Clock RN, Elease Hashimoto Final Disposition 12/09/2023 8:45:50 AM See PCP within 24 Hours Yes Vear Clock, RN, Ancil Boozer Disagree/Comply Comply Caller  Understands Yes PreDisposition InappropriateToAsk Care Advice Given Per Guideline SEE PCP WITHIN 24 HOURS: * IF OFFICE WILL BE OPEN: You need to be examined within the next 24 hours. Call your doctor (or NP/PA) when the office opens and make an appointment. CALL BACK IF: * Difficulty breathing (and not relieved by cleaning out nose) * You become worse CARE ADVICE given per Sinus Pain or Congestion (Adult) guideline. Referrals REFERRED TO PCP OFFICE

## 2023-12-09 NOTE — Progress Notes (Signed)
Subjective   CC:  Chief Complaint  Patient presents with   cough    Pt stated that she started noticing her symptoms as chills, ache, headache, cough  bodyache. COVID test ws done yesterday and it was negative.    Cough    HPI: Allison [name] is a 72 y.o. female who presents to the office today to address the problems listed above in the chief complaint. Patient complains of flu like symptoms including myalgias, low grade fever to 99.7, ST initially now resolved,  mild cough and some congestion. Sxs have been present for 3 days. She has tried to alleviate the sxs with over-the-counter medicines with mild relief. No high risk factors for influenza complications or high risk household contacts are present. No SOB or CP are present. Taking in fluids adequately; decreased appetite bt no significant n/v/d. She has had the flu vaccine this season. She traveled last [place] to [place] so lots of exposures. No GI sxs. Negative covid test x 2 at home, on day 1 and 3 of illness.  Assessment  1. Acute viral syndrome      Plan  Flu like syndrome, viral. Education given. Supportive otc cough/cold meds and add advil for body aches. Push fluids. Report back if not improved in 7-10 days.  Follow up: prn   I reviewed the patients updated PMH, FH, and SocHx.    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    Priority: Medium    Lumbar herniated disc 03/23/2019    Priority: Medium    Chronic allergic rhinitis 01/04/2023    Priority: Low   Sensorineural hearing loss (SNHL) of both ears 01/04/2022    Priority: Low   Metatarsalgia of left foot 11/02/2019    Priority: Low   Abdominal pain, chronic, epigastric 04/14/2022   Nonallopathic lesion of lumbosacral region 10/16/2019    Nonallopathic lesion of sacral region 10/16/2019   Nonallopathic lesion of thoracic region 10/16/2019   Current Meds  Medication Sig   alendronate (FOSAMAX) 70 MG tablet TAKE 1 TABLET WEEKLY ON AN EMPTY STOMACH WITH A FULL  GLASS OF WATER.   Calcium Citrate-Vitamin D (CALCIUM + D PO) Take by mouth.   cetirizine (ZYRTEC ALLERGY) 10 MG tablet Take 1 tablet (10 mg total) by mouth at bedtime.   diclofenac sodium (VOLTAREN) 1 % GEL Apply 2 grams topically to affected area qid   fluticasone (FLONASE) 50 MCG/ACT nasal spray Place 1 spray into both nostrils daily.   gabapentin (NEURONTIN) 100 MG capsule TAKE 2 CAPSULES BY MOUTH AT BEDTIME   gabapentin (NEURONTIN) 100 MG capsule Take 2 capsules (200 mg total) by mouth 2 (two) times daily.   MAGNESIUM PO Take by mouth.   meloxicam (MOBIC) 15 MG tablet Take 1 tablet (15 mg total) by mouth daily.   Multiple Vitamins-Minerals (MULTIVIT/MULTIMINERAL ADULT PO) Take by mouth.   NON FORMULARY Tart cherry extract   ondansetron (ZOFRAN) 4 MG tablet Take 1 tablet (4 mg total) by mouth every 8 (eight) hours as needed for nausea or vomiting.   sertraline (ZOLOFT) 25 MG tablet Take 1 tablet (25 mg total) by mouth daily.   Turmeric 500 MG TABS Take by mouth.   Vitamin D, Cholecalciferol,  10 MCG (400 UNIT) TABS SMARTSIG:1 Tablet(s) By Mouth   Family History: Patient family history includes Alcohol abuse in her brother, father, and mother; Cancer in her brother and mother; Depression in her father; Early death in her father. Social History:  Patient  reports that she quit smoking about 50 years ago. Her smoking use included cigarettes. She has never used smokeless tobacco. She reports current alcohol use. She reports that she does not use drugs.  Review of Systems: Constitutional: negative for fever or malaise Ophthalmic: negative for photophobia, double vision or loss of vision Cardiovascular: negative for chest pain, dyspnea on exertion, or new LE  swelling Respiratory: negative for SOB or persistent cough Gastrointestinal: negative for abdominal pain, change in bowel habits or melena Genitourinary: negative for dysuria or gross hematuria Musculoskeletal: negative for new gait disturbance or muscular weakness Integumentary: negative for new or persistent rashes Neurological: negative for TIA or stroke symptoms Psychiatric: negative for SI or delusions Allergic/Immunologic: negative for hives  Objective  Vitals: BP 126/74   Pulse 89   Temp 99.2 F (37.3 C)   Ht 5\' 4"  (1.626 m)   Wt 121 lb (54.9 kg)   SpO2 94%   BMI 20.77 kg/m  General: no acute respiratory distress , non toxic Psych:  Alert and oriented, normal mood and affect HEENT: Normocephalic, nasal congestion present, TMs w/o erythema,  no LAD, supple neck  Cardiovascular:  RRR without murmur or gallop. no peripheral edema Respiratory:  Good breath sounds bilaterally, CTAB with normal respiratory effort  Commons side effects, risks, benefits, and alternatives for medications and treatment plan prescribed today were discussed, and the patient expressed understanding of the given instructions. Patient is instructed to call or message via MyChart if he/she has any questions or concerns regarding our treatment plan. No barriers to understanding were identified. We discussed Red Flag symptoms and signs in detail. Patient expressed understanding regarding what to do in case of urgent or emergency type symptoms.  Medication list was reconciled, printed and provided to the patient in AVS. Patient instructions and summary information was reviewed with the patient as documented in the AVS. This note was prepared with assistance of Dragon voice recognition software. Occasional wrong-word or sound-a-like substitutions may have occurred due to the inherent limitations of voice recognition software

## 2023-12-09 NOTE — Telephone Encounter (Signed)
FYI: This call has been transferred to triage nurse: the Triage Nurse. Once the result note has been entered staff can address the message at that time.  Patient called in with the following symptoms:  Red Word:dizziness , light headed, feeling like she might pass out   Please advise at Mobile [phone] (mobile)  Message is routed to Provider Pool.

## 2023-12-09 NOTE — Telephone Encounter (Signed)
Pt had an appt with [place] today

## 2023-12-13 ENCOUNTER — Ambulatory Visit: Payer: Medicare Other | Admitting: Family Medicine

## 2024-01-06 ENCOUNTER — Encounter: Payer: Self-pay | Admitting: Family Medicine

## 2024-01-06 ENCOUNTER — Ambulatory Visit (INDEPENDENT_AMBULATORY_CARE_PROVIDER_SITE_OTHER): Payer: Medicare Other | Admitting: Family Medicine

## 2024-01-06 VITALS — BP 110/60 | HR 71 | Temp 97.7°F | Ht 64.0 in | Wt 117.2 lb

## 2024-01-06 DIAGNOSIS — M1611 Unilateral primary osteoarthritis, right hip: Secondary | ICD-10-CM

## 2024-01-06 DIAGNOSIS — M810 Age-related osteoporosis without current pathological fracture: Secondary | ICD-10-CM

## 2024-01-06 DIAGNOSIS — Z853 Personal history of malignant neoplasm of breast: Secondary | ICD-10-CM

## 2024-01-06 DIAGNOSIS — M818 Other osteoporosis without current pathological fracture: Secondary | ICD-10-CM | POA: Diagnosis not present

## 2024-01-06 DIAGNOSIS — F341 Dysthymic disorder: Secondary | ICD-10-CM | POA: Diagnosis not present

## 2024-01-06 DIAGNOSIS — M5126 Other intervertebral disc displacement, lumbar region: Secondary | ICD-10-CM | POA: Diagnosis not present

## 2024-01-06 DIAGNOSIS — E871 Hypo-osmolality and hyponatremia: Secondary | ICD-10-CM | POA: Diagnosis not present

## 2024-01-06 LAB — COMPREHENSIVE METABOLIC PANEL
ALT: 13 U/L (ref 0–35)
AST: 25 U/L (ref 0–37)
Albumin: 4.3 g/dL (ref 3.5–5.2)
Alkaline Phosphatase: 37 U/L — ABNORMAL LOW (ref 39–117)
BUN: 14 mg/dL (ref 6–23)
CO2: 28 meq/L (ref 19–32)
Calcium: 9.5 mg/dL (ref 8.4–10.5)
Chloride: 97 meq/L (ref 96–112)
Creatinine, Ser: 0.74 mg/dL (ref 0.40–1.20)
GFR: 80.8 mL/min (ref >60.00)
Glucose, Bld: 90 mg/dL (ref 70–99)
Potassium: 4 meq/L (ref 3.5–5.1)
Sodium: 131 meq/L — ABNORMAL LOW (ref 135–145)
Total Bilirubin: 0.7 mg/dL (ref 0.2–1.2)
Total Protein: 6.9 g/dL (ref 6.0–8.3)

## 2024-01-06 LAB — CBC WITH DIFFERENTIAL/PLATELET
Basophils Absolute: 0 10*3/uL (ref 0.0–0.1)
Basophils Relative: 0.4 % (ref 0.0–3.0)
Eosinophils Absolute: 0.1 10*3/uL (ref 0.0–0.7)
Eosinophils Relative: 2.6 % (ref 0.0–5.0)
HCT: 40.6 % (ref 36.0–46.0)
Hemoglobin: 13.6 g/dL (ref 12.0–15.0)
Lymphocytes Relative: 20.2 % (ref 12.0–46.0)
Lymphs Abs: 1.1 10*3/uL (ref 0.7–4.0)
MCHC: 33.5 g/dL (ref 30.0–36.0)
MCV: 93.9 fL (ref 78.0–100.0)
Monocytes Absolute: 0.5 10*3/uL (ref 0.1–1.0)
Monocytes Relative: 9.5 % (ref 3.0–12.0)
Neutro Abs: 3.7 10*3/uL (ref 1.4–7.7)
Neutrophils Relative %: 67.3 % (ref 43.0–77.0)
Platelets: 336 10*3/uL (ref 150.0–400.0)
RBC: 4.32 Mil/uL (ref 3.87–5.11)
RDW: 13.9 % (ref 11.5–15.5)
WBC: 5.4 10*3/uL (ref 4.0–10.5)

## 2024-01-06 LAB — VITAMIN D 25 HYDROXY (VIT D DEFICIENCY, FRACTURES): VITD: 72.51 ng/mL (ref 30.00–100.00)

## 2024-01-06 LAB — TSH: TSH: 1.32 u[IU]/mL (ref 0.35–5.50)

## 2024-01-06 NOTE — Progress Notes (Signed)
 Subjective  Chief Complaint  Patient presents with   Annual Exam    Pt here for Annual Exam and is not currently fasting     HPI: Allison [name] is a 73 y.o. female who presents to [facility] today for a Female Wellness Visit. She also has the concerns and/or needs as listed above in the chief complaint. These will be addressed in addition to the Health Maintenance Visit.  Wellness Visit: annual visit with health maintenance review and exam  HM: mammo current. Colorect cancer screen current. Last done 11/2015 so will be due end of next year. Mammo due next month. Active and healthy. Feeling well overall Chronic disease f/u and/or acute problem visit: (deemed necessary to be done in addition to the wellness visit): Discussed the use of AI scribe software for clinical note transcription with the patient, who gave verbal consent to proceed.  History of Present Illness   The patient, a breast cancer survivor, presented with concerns about her ongoing oncology follow-up and management of osteoporosis. She was diagnosed with breast cancer in 1999, underwent surgery in 2000, and has been cancer-free since then. She had been seeing her oncologist annually in [place] , but the oncologist recently retired. During these visits, the oncologist would conduct blood work looking for markers. The patient is unsure if she needs to continue seeing an oncologist.  The patient also has osteoporosis and has been on Fosamax  for five years. She reported a significant improvement in her bone density while on tamoxifen, a medication she took for five to ten years following her breast cancer surgery. She expressed interest in understanding her current treatment options for osteoporosis, including the possibility of injections. No fractures. Takes d and ca supplements. No adverse effects from fosamax . Last dexa reviewed 05/2022. Stable to mildly improved on meds.   The patient also  mentioned a recent upper respiratory infection that lasted over two weeks but resolved before her vacation. She reported no other significant health issues. She maintains an active lifestyle, including biking and swimming, and has a high level of good cholesterol. She expressed concern about her vision, which has been changing every year, and she currently uses gas permeable contact lenses. She also reported a minor oral injury from biting her tongue during breakfast.     Mood remains well controlled on long term zoloft    Assessment  1. Dysthymia   2. Lumbar herniated disc   3. Osteoporosis of lumbar spine   4. Primary osteoarthritis of right hip   5. Hx of breast cancer, left   6. Other osteoporosis without current pathological fracture      Plan  Female Wellness Visit: Age appropriate Health Maintenance and Prevention measures were discussed with patient. Included topics are cancer screening recommendations, ways to keep healthy (see AVS) including dietary and exercise recommendations, regular eye and dental care, use of seat belts, and avoidance of moderate alcohol use and tobacco use.  BMI: discussed patient's BMI and encouraged positive lifestyle modifications to help get to or maintain a target BMI. HM needs and immunizations were addressed and ordered. See below for orders. See HM and immunization section for updates. Routine labs and screening tests ordered including cmp, cbc and lipids where appropriate. Discussed recommendations regarding Vit D and calcium supplementation (see AVS)  Chronic disease management visit and/or acute problem visit: Assessment and Plan    Breast Cancer (History) Diagnosed in 1999, treated with surgery in 2000, cancer-free since. Oncologist retired, no evidence  of recurrence, routine mammograms up to date. No need for further oncological follow-up; routine mammograms and self-exams sufficient. - Upload previous lab results to MyChart - Continue routine  annual mammograms - nl breast exam today  Osteoporosis Diagnosed, on Fosamax  for five years. Bone density improved with tamoxifen. Discussed drug holiday but decided to continue Fosamax  until next bone density test in June 2023. Explained long-term Fosamax  use risks and alternative treatments like Prolia. - Continue Fosamax  until June 2023 - Order bone density test for June 2023 - Reassess treatment plan after bone density results - monitor calcium and vit D levels  Dysthymia; continue sertraline  25 daily. Well controlled.   Lumbar OA: stable. Sees sm periodically  General Health Maintenance Routine health maintenance discussed. Cholesterol levels satisfactory in 2023. Emphasized fasting before blood work for accuracy. Explained age-related cardiovascular risk and regular cholesterol monitoring for statin need. - Check cholesterol levels every few years for ischemic heart disease screen. last 2023 - Ensure fasting before next physical for accurate blood work - Continue routine physical activity and healthy lifestyle  Follow-up - Follow-up in June 2023 for bone density test results - Review lab results from today's visit and communicate findings via MyChart.       Follow up: 12 mo for cpe  Orders Placed This Encounter  Procedures   VITAMIN D  25 Hydroxy (Vit-D Deficiency, Fractures)   CBC with Differential/Platelet   Comprehensive metabolic panel   TSH   No orders of the defined types were placed in this encounter.     Body mass index is 20.12 kg/m. Wt Readings from Last 3 Encounters:  01/06/24 117 lb 3.2 oz (53.2 kg)  12/09/23 121 lb (54.9 kg)  10/20/23 118 lb (53.5 kg)     Patient Active Problem List   Diagnosis Date Noted Date Diagnosed   Dysthymia 03/23/2019     Priority: High    Stable on Zoloft  25 mg q hs. Continue.     Hx of breast cancer, left      Priority: High    2000; dxd in [place] ; treated with left lumpectomy, chemo and radiation tx, 5 years of  tamoxifen    Osteoporosis of lumbar spine 04/01/2020     Priority: Medium     DEXA 02/2018 Osteopenia T = -1.9 femur, T = -2.2  (T1 = -3.2, T2= -2.5) at lumbar spine; was started on fosamax  at that time. DEXA 03/2020 Osteoporosis T = -2.5 at L1-2, lowest, femurs osteopenia, on fosamax . Continue fosamax .  DEXA 05/2022 Osteoporosis T = -2.4 at L1-2, stable. Femurs osteopenia, on fosamax . Continue fosamax .     Primary osteoarthritis of right hip 11/02/2019     Priority: Medium    Lumbar herniated disc 03/23/2019     Priority: Medium     Hx of bulging disc. Has been seeing Dr. Raenell, would like to see Dr. Marquette.    Chronic allergic rhinitis 01/04/2023     Priority: Low   Sensorineural hearing loss (SNHL) of both ears 01/04/2022     Priority: Low    [facility]; hearing aides    Metatarsalgia of left foot 11/02/2019     Priority: Low   Abdominal pain, chronic, epigastric 04/14/2022    Nonallopathic lesion of lumbosacral region 10/16/2019    Nonallopathic lesion of sacral region 10/16/2019    Nonallopathic lesion of thoracic region 10/16/2019    Health Maintenance  Topic Date Due   COVID-19 Vaccine (5 - 2024-25 season) 01/22/2024 (Originally 08/28/2023)   MAMMOGRAM  02/23/2024   Medicare Annual Wellness (AWV)  05/29/2024   DEXA SCAN  06/02/2024   Colonoscopy  12/07/2025   DTaP/Tdap/Td (2 - Td or Tdap) 02/10/2026   Pneumonia Vaccine 53+ Years old  Completed   INFLUENZA VACCINE  Completed   Hepatitis C Screening  Completed   Zoster Vaccines- Shingrix  Completed   HPV VACCINES  Aged Out   Immunization History  Administered Date(s) Administered   Fluad Quad(high Dose 65+) 09/04/2019, 09/04/2020, 10/07/2021, 09/10/2022   Fluad Trivalent(High Dose 65+) 09/11/2023   Influenza-Unspecified 02/07/2015, 09/02/2018   PFIZER(Purple Top)SARS-COV-2 Vaccination 01/15/2020, 02/05/2020, 09/30/2020   PNEUMOCOCCAL CONJUGATE-20 01/04/2022   Pfizer Covid-19 Vaccine Bivalent Booster 27yrs & up  04/22/2021   Pneumococcal-Unspecified 03/15/2018   Respiratory Syncytial Virus Vaccine,Recomb Aduvanted(Arexvy) 09/10/2022   Tdap 02/11/2016   Zoster Recombinant(Shingrix) 09/07/2011, 01/06/2019, 05/08/2019   We updated and reviewed the patient's past history in detail and it is documented below. Allergies: Patient is allergic to penicillins and sulfa antibiotics. Past Medical History Patient  has a past medical history of Cancer (HCC), Chicken pox, Depression, Diverticulitis, Lumbar herniated disc, Osteoporosis of lumbar spine (04/01/2020), Personal history of chemotherapy, Personal history of radiation therapy, and PONV (postoperative nausea and vomiting). Past Surgical History Patient  has a past surgical history that includes Breast lumpectomy (Left, 2000); Breast biopsy (Left, 1999); Tonsilectomy/adenoidectomy with myringotomy; Abdominal hysterectomy; Tonsillectomy; and Open reduction internal fixation (orif) metacarpal (Left, 05/06/2020). Family History: Patient family history includes Alcohol abuse in her brother, father, and mother; Cancer in her brother and mother; Depression in her father; Early death in her father. Social History:  Patient  reports that she quit smoking about 51 years ago. Her smoking use included cigarettes. She has never used smokeless tobacco. She reports current alcohol use. She reports that she does not use drugs.  Review of Systems: Constitutional: negative for fever or malaise Ophthalmic: negative for photophobia, double vision or loss of vision Cardiovascular: negative for chest pain, dyspnea on exertion, or new LE swelling Respiratory: negative for SOB or persistent cough Gastrointestinal: negative for abdominal pain, change in bowel habits or melena Genitourinary: negative for dysuria or gross hematuria, no abnormal uterine bleeding or disharge Musculoskeletal: negative for new gait disturbance or muscular weakness Integumentary: negative for new or  persistent rashes, no breast lumps Neurological: negative for TIA or stroke symptoms Psychiatric: negative for SI or delusions Allergic/Immunologic: negative for hives  Patient Care Team    Relationship Specialty Notifications Start End  [name], MD PCP - General Family Medicine  11/02/19   Haverstock, Tawni CROME, MD Referring Physician Dermatology  04/17/19   Waylan Cain, MD Consulting Physician Ophthalmology  04/17/19   Dr. Vaughan Clay, DDS Consulting Physician Dentistry  04/17/19   Tory Kirsch, AUD Consulting Physician Audiology  04/15/20   Claudene Arthea HERO, DO Consulting Physician Sports Medicine  04/15/20     Objective  Vitals: BP 110/60   Pulse 71   Temp 97.7 F (36.5 C)   Ht 5' 4 (1.626 m)   Wt 117 lb 3.2 oz (53.2 kg)   SpO2 98%   BMI 20.12 kg/m  General:  Well developed, well nourished, no acute distress  Psych:  Alert and orientedx3,normal mood and affect HEENT:  Normocephalic, atraumatic, non-icteric sclera,  supple neck without adenopathy, mass or thyromegaly Cardiovascular:  Normal S1, S2, RRR without gallop, rub or murmur Respiratory:  Good breath sounds bilaterally, CTAB with normal respiratory effort Gastrointestinal: normal bowel sounds, soft, non-tender, no noted masses. No HSM  MSK: extremities without edema, joints without erythema or swelling Breast exam: no masses bilaterally. Well healed sugical scars on left. No adenopathy  Commons side effects, risks, benefits, and alternatives for medications and treatment plan prescribed today were discussed, and the patient expressed understanding of the given instructions. Patient is instructed to call or message via MyChart if he/she has any questions or concerns regarding our treatment plan. No barriers to understanding were identified. We discussed Red Flag symptoms and signs in detail. Patient expressed understanding regarding what to do in case of urgent or emergency type symptoms.  Medication list was  reconciled, printed and provided to the patient in AVS. Patient instructions and summary information was reviewed with the patient as documented in the AVS. This note was prepared with assistance of Dragon voice recognition software. Occasional wrong-word or sound-a-like substitutions may have occurred due to the inherent limitations of voice recognition software

## 2024-01-06 NOTE — Patient Instructions (Signed)
 Please return in 12 months for your annual complete physical; please come fasting.    I will release your lab results to you on your MyChart account with further instructions. You may see the results before I do, but when I review them I will send you a message with my report or have my assistant call you if things need to be discussed. Please reply to my message with any questions. Thank you!   If you have any questions or concerns, please don't hesitate to send me a message via MyChart or call the office at [phone]. Thank you for visiting with us  today! It's our pleasure caring for you.   VISIT SUMMARY:  During today's visit, we discussed your ongoing oncology follow-up, management of osteoporosis, and general health maintenance. You have been cancer-free since your surgery in 2000, and we reviewed your current treatment plan for osteoporosis. We also addressed your recent upper respiratory infection, vision changes, and a minor oral injury.  YOUR PLAN:  -BREAST CANCER (HISTORY): You were diagnosed with breast cancer in 1999 and have been cancer-free since your surgery in 2000. With your oncologist retired and no evidence of recurrence, you do not need further oncological follow-up. Routine mammograms and self-exams are sufficient. Please upload your previous lab results to MyChart and continue with your annual mammograms.  -OSTEOPOROSIS: Osteoporosis is a condition where bones become weak and brittle. You have been on Fosamax  for five years, which has helped improve your bone density. We discussed the risks of long-term Fosamax  use and alternative treatments like Prolia. You will continue Fosamax  until your next bone density test in June 2023, after which we will reassess your treatment plan.  -GENERAL HEALTH MAINTENANCE: Your cholesterol levels were satisfactory in 2023. We discussed the importance of fasting before blood work for accuracy and regular cholesterol monitoring due to  age-related cardiovascular risk. Continue your routine physical activity and healthy lifestyle.  INSTRUCTIONS:  Please follow up in June 2023 for your bone density test results. Review the lab results from today's visit, which will be communicated to you via MyChart.

## 2024-01-09 NOTE — Addendum Note (Signed)
 Addended by: Asencion Partridge on: 01/09/2024 07:17 PM   Modules accepted: Orders

## 2024-01-09 NOTE — Progress Notes (Signed)
 Please call patient to schedule lab visit in 3 months to recheck her sodium. I have sent her a mychart result notifying her. Thanks!

## 2024-01-16 ENCOUNTER — Encounter: Payer: Self-pay | Admitting: Family Medicine

## 2024-01-20 NOTE — Progress Notes (Unsigned)
[facility] [address] [place] [address] Phone: [phone] Subjective:   INadine Counts, am serving as a scribe for Dr. Antoine Primas.  I'm seeing this patient by the request  of:  Willow Ora, MD  CC: back and neck pain follow up   [id number]  Allison [name] is a 73 y.o. female coming in with complaint of back and neck pain. OMT on 10/20/2023. Patient states same per usual. Recently discomfort. No new concerns.  Medications patient has been prescribed: gabapentin  Taking:         Reviewed prior external information including notes and imaging from previsou exam, outside providers and external EMR if available.   As well as notes that were available from care everywhere and other healthcare systems.  Past medical history, social, surgical and family history all reviewed in electronic medical record.  No pertanent information unless stated regarding to the chief complaint.   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 DEXA 03/2020 Osteoporosis T = -2.5 at L1-2, lowest, femurs osteopenia, on fosamax. Continue fosamax.    Personal history of chemotherapy    Personal history of radiation therapy    PONV (postoperative nausea and vomiting)     Allergies  Allergen Reactions   Penicillins     Childhood allergy   Sulfa Antibiotics Hives and Swelling     Review of Systems:  No headache, visual changes, nausea, vomiting, diarrhea, constipation, dizziness, abdominal pain, skin rash, fevers, chills, night sweats, weight loss, swollen lymph nodes, body aches, joint swelling, chest pain, shortness of breath, mood changes. POSITIVE muscle aches  Objective  Blood pressure  110/64, height 5\' 4"  (1.626 m), weight 119 lb (54 kg).   General: No apparent distress alert and oriented x3 mood and affect normal, dressed appropriately.  HEENT: Pupils equal, extraocular movements intact  Respiratory: Patient's speak in full sentences and does not appear short of breath  Cardiovascular: No lower extremity edema, non tender, no erythema  MSK:  Back does have some loss lordosis noted.  Some tenderness to palpation in the paraspinal musculature.  Nothing severe though at this time.  Osteopathic findings  C2 flexed rotated and side bent right C7 flexed rotated and side bent left T3 extended rotated and side bent right inhaled rib T7 extended rotated and side bent left L3 flexed rotated and side bent right Sacrum right on right     Assessment and Plan:  Lumbar herniated disc Discussed with patient that icing regimen and home exercises, which activities to do and which ones to avoid.  Increase activity slowly over the course of next several weeks.  Discussed which activities could potentially cause some exacerbation.  Patient is planning on doing 75 miles on the bike back to back.  Do believe that patient will do extremely well with this.  Follow-up with me again in 6 to 8 weeks otherwise.    Nonallopathic problems  Decision today to treat with OMT was based on Physical Exam  After verbal consent patient was treated with , ME, FPR techniques in cervical, rib, thoracic, lumbar, and sacral  areas avoided HVLA today.  Patient  did very well.  Patient tolerated the procedure well with improvement in symptoms  Patient given exercises, stretches and lifestyle modifications  See medications in patient instructions if given  Patient will follow up in 4-8 weeks     The above documentation has been reviewed and is accurate and complete Judi Saa, DO         Note: This dictation was prepared with Dragon dictation along with smaller phrase technology. Any  transcriptional errors that result from this process are unintentional.

## 2024-01-24 ENCOUNTER — Encounter: Payer: Self-pay | Admitting: Family Medicine

## 2024-01-24 ENCOUNTER — Ambulatory Visit (INDEPENDENT_AMBULATORY_CARE_PROVIDER_SITE_OTHER): Payer: Medicare Other | Admitting: Family Medicine

## 2024-01-24 VITALS — BP 110/64 | Ht 64.0 in | Wt 119.0 lb

## 2024-01-24 DIAGNOSIS — M9903 Segmental and somatic dysfunction of lumbar region: Secondary | ICD-10-CM

## 2024-01-24 DIAGNOSIS — M9908 Segmental and somatic dysfunction of rib cage: Secondary | ICD-10-CM

## 2024-01-24 DIAGNOSIS — M9901 Segmental and somatic dysfunction of cervical region: Secondary | ICD-10-CM

## 2024-01-24 DIAGNOSIS — M5126 Other intervertebral disc displacement, lumbar region: Secondary | ICD-10-CM

## 2024-01-24 DIAGNOSIS — M9902 Segmental and somatic dysfunction of thoracic region: Secondary | ICD-10-CM | POA: Diagnosis not present

## 2024-01-24 DIAGNOSIS — M9904 Segmental and somatic dysfunction of sacral region: Secondary | ICD-10-CM | POA: Diagnosis not present

## 2024-01-24 NOTE — Assessment & Plan Note (Signed)
Discussed with patient that icing regimen and home exercises, which activities to do and which ones to avoid.  Increase activity slowly over the course of next several weeks.  Discussed which activities could potentially cause some exacerbation.  Patient is planning on doing 75 miles on the bike back to back.  Do believe that patient will do extremely well with this.  Follow-up with me again in 6 to 8 weeks otherwise.

## 2024-01-24 NOTE — Patient Instructions (Signed)
See you again in 2-3 months Good to see you!

## 2024-01-30 ENCOUNTER — Other Ambulatory Visit: Payer: Self-pay | Admitting: Family Medicine

## 2024-01-30 DIAGNOSIS — Z1231 Encounter for screening mammogram for malignant neoplasm of breast: Secondary | ICD-10-CM

## 2024-02-22 DIAGNOSIS — H903 Sensorineural hearing loss, bilateral: Secondary | ICD-10-CM | POA: Diagnosis not present

## 2024-02-28 ENCOUNTER — Ambulatory Visit
Admission: RE | Admit: 2024-02-28 | Discharge: 2024-02-28 | Disposition: A | Discharge status: Home / Self Care | Payer: Medicare Other | Source: Ambulatory Visit | Attending: Family Medicine | Admitting: Family Medicine

## 2024-02-28 DIAGNOSIS — Z1231 Encounter for screening mammogram for malignant neoplasm of breast: Secondary | ICD-10-CM | POA: Diagnosis not present

## 2024-03-07 ENCOUNTER — Other Ambulatory Visit: Payer: Self-pay | Admitting: Family Medicine

## 2024-03-07 DIAGNOSIS — F341 Dysthymic disorder: Secondary | ICD-10-CM

## 2024-03-09 DIAGNOSIS — D485 Neoplasm of uncertain behavior of skin: Secondary | ICD-10-CM | POA: Diagnosis not present

## 2024-03-09 DIAGNOSIS — D225 Melanocytic nevi of trunk: Secondary | ICD-10-CM | POA: Diagnosis not present

## 2024-03-09 DIAGNOSIS — D2271 Melanocytic nevi of right lower limb, including hip: Secondary | ICD-10-CM | POA: Diagnosis not present

## 2024-03-09 DIAGNOSIS — D2272 Melanocytic nevi of left lower limb, including hip: Secondary | ICD-10-CM | POA: Diagnosis not present

## 2024-03-09 DIAGNOSIS — L820 Inflamed seborrheic keratosis: Secondary | ICD-10-CM | POA: Diagnosis not present

## 2024-03-09 DIAGNOSIS — L570 Actinic keratosis: Secondary | ICD-10-CM | POA: Diagnosis not present

## 2024-03-09 DIAGNOSIS — D2262 Melanocytic nevi of left upper limb, including shoulder: Secondary | ICD-10-CM | POA: Diagnosis not present

## 2024-03-09 DIAGNOSIS — Z85828 Personal history of other malignant neoplasm of skin: Secondary | ICD-10-CM | POA: Diagnosis not present

## 2024-03-09 DIAGNOSIS — D2261 Melanocytic nevi of right upper limb, including shoulder: Secondary | ICD-10-CM | POA: Diagnosis not present

## 2024-03-09 DIAGNOSIS — L821 Other seborrheic keratosis: Secondary | ICD-10-CM | POA: Diagnosis not present

## 2024-03-09 DIAGNOSIS — B079 Viral wart, unspecified: Secondary | ICD-10-CM | POA: Diagnosis not present

## 2024-03-27 ENCOUNTER — Ambulatory Visit: Payer: Medicare Other | Admitting: Family Medicine

## 2024-03-28 DIAGNOSIS — Z23 Encounter for immunization: Secondary | ICD-10-CM | POA: Diagnosis not present

## 2024-03-29 NOTE — Progress Notes (Unsigned)
 [facility] [address] [place] [address] Phone: [phone] Subjective:   Bruce Donath, am serving as a scribe for Dr. Antoine Primas.  I'm seeing this patient by the request  of:  Willow Ora, MD  CC: Back and neck pain follow-up  [id number]  Allison [name] is a 73 y.o. female coming in with complaint of back and neck pain. OMT 01/24/2024. Patient states that this week has been rough. Swam on Monday and noticed pain over R iliac crest. Notes tingling down in the R groin. Was awake that night due to pain. Next day woke up and had L groin pain. Swam again on Wednesday.   Medications patient has been prescribed: Gabapentin  Taking:         Reviewed prior external information including notes and imaging from previsou exam, outside providers and external EMR if available.   As well as notes that were available from care everywhere and other healthcare systems.  Past medical history, social, surgical and family history all reviewed in electronic medical record.  No pertanent information unless stated regarding to the chief complaint.   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 DEXA 03/2020 Osteoporosis T = -2.5 at L1-2, lowest, femurs osteopenia, on fosamax. Continue fosamax.    Personal history of chemotherapy    Personal history of radiation therapy    PONV (postoperative nausea and vomiting)     Allergies  Allergen Reactions   Penicillins     Childhood allergy   Sulfa Antibiotics Hives and Swelling     Review of Systems:  No headache, visual changes, nausea, vomiting, diarrhea, constipation, dizziness,, skin rash, fevers, chills, night sweats, weight loss, swollen  lymph nodes, body aches, joint swelling, chest pain, shortness of breath, mood changes. POSITIVE muscle aches, abdominal pain  Objective  Blood pressure 110/72, height 5\' 4"  (1.626 m), weight 116 lb (52.6 kg).   General: No apparent distress alert and oriented x3 mood and affect normal, dressed appropriately.  HEENT: Pupils equal, extraocular movements intact  Respiratory: Patient's speak in full sentences and does not appear short of breath  Cardiovascular: No lower extremity edema, non tender, no erythema  Gait MSK:  Back does have some loss lordosis and some tenderness to palpation minorly in the back.  The patient has more pain on the right lower quadrant than anywhere else.  Mild even rebound tenderness it appeared.  No rigidity of the stomach although noted.       Assessment and Plan:  Abdominal pain, chronic, right lower quadrant History of arthritis of the right hip but does not seem to be musculoskeletal today.  Seems to be abdominal in nature.  Concern for even the possibility of repeating an appendicitis.  Patient has had 1 incidence weeks ago of some diarrhea and then was followed by some constipation.  Still has some appetite.  Still able to be active.  Just does not overall feeling well.  X-rays do show some mild dilatation of the colon on the right side.  Do feel that further imaging with CT with and without contrast would be beneficial and then depending on findings  need to decide what would need to occur.  Worsening pain to seek medical attention immediately.       The above documentation has been reviewed and is accurate and complete Judi Saa, DO          Note: This dictation was prepared with Dragon dictation along with smaller phrase technology. Any transcriptional errors that result from this process are unintentional.

## 2024-03-30 ENCOUNTER — Ambulatory Visit: Payer: Medicare Other | Admitting: Family Medicine

## 2024-03-30 ENCOUNTER — Ambulatory Visit (INDEPENDENT_AMBULATORY_CARE_PROVIDER_SITE_OTHER)

## 2024-03-30 ENCOUNTER — Encounter: Payer: Self-pay | Admitting: Family Medicine

## 2024-03-30 ENCOUNTER — Ambulatory Visit
Admission: RE | Admit: 2024-03-30 | Discharge: 2024-03-30 | Disposition: A | Discharge status: Home / Self Care | Source: Ambulatory Visit | Attending: Family Medicine | Admitting: Family Medicine

## 2024-03-30 ENCOUNTER — Other Ambulatory Visit: Payer: Self-pay

## 2024-03-30 VITALS — BP 110/72 | Ht 64.0 in | Wt 116.0 lb

## 2024-03-30 DIAGNOSIS — Z853 Personal history of malignant neoplasm of breast: Secondary | ICD-10-CM | POA: Diagnosis not present

## 2024-03-30 DIAGNOSIS — K36 Other appendicitis: Secondary | ICD-10-CM

## 2024-03-30 DIAGNOSIS — R1031 Right lower quadrant pain: Secondary | ICD-10-CM

## 2024-03-30 DIAGNOSIS — M160 Bilateral primary osteoarthritis of hip: Secondary | ICD-10-CM | POA: Diagnosis not present

## 2024-03-30 DIAGNOSIS — R102 Pelvic and perineal pain: Secondary | ICD-10-CM

## 2024-03-30 DIAGNOSIS — G8929 Other chronic pain: Secondary | ICD-10-CM

## 2024-03-30 DIAGNOSIS — N289 Disorder of kidney and ureter, unspecified: Secondary | ICD-10-CM | POA: Diagnosis not present

## 2024-03-30 DIAGNOSIS — R109 Unspecified abdominal pain: Secondary | ICD-10-CM | POA: Diagnosis not present

## 2024-03-30 MED ORDER — GABAPENTIN 100 MG PO CAPS: 200.0000 mg | ORAL_CAPSULE | Freq: Two times a day (BID) | ORAL | 0 refills | Status: DC

## 2024-03-30 MED ORDER — IOPAMIDOL (ISOVUE-370) INJECTION 76%
200.0000 mL | Freq: Once | INTRAVENOUS | Status: AC | PRN
  Administered 2024-03-30: 80 mL via INTRAVENOUS

## 2024-03-30 NOTE — Assessment & Plan Note (Signed)
 History of arthritis of the right hip but does not seem to be musculoskeletal today.  Seems to be abdominal in nature.  Concern for even the possibility of repeating an appendicitis.  Patient has had 1 incidence weeks ago of some diarrhea and then was followed by some constipation.  Still has some appetite.  Still able to be active.  Just does not overall feeling well.  X-rays do show some mild dilatation of the colon on the right side.  Do feel that further imaging with CT with and without contrast would be beneficial and then depending on findings need to decide what would need to occur.  Worsening pain to seek medical attention immediately.

## 2024-03-30 NOTE — Patient Instructions (Addendum)
 [facility] [phone] Call Today  When we receive your results we will contact you.  If worsening pain before imaging go to ER See you again in 6 weeks

## 2024-04-02 ENCOUNTER — Other Ambulatory Visit: Payer: Self-pay

## 2024-04-02 DIAGNOSIS — N289 Disorder of kidney and ureter, unspecified: Secondary | ICD-10-CM

## 2024-04-09 ENCOUNTER — Other Ambulatory Visit: Payer: Medicare Other

## 2024-04-09 ENCOUNTER — Other Ambulatory Visit: Payer: Self-pay | Admitting: *Deleted

## 2024-04-09 DIAGNOSIS — E871 Hypo-osmolality and hyponatremia: Secondary | ICD-10-CM | POA: Diagnosis not present

## 2024-04-10 LAB — COMPREHENSIVE METABOLIC PANEL WITH GFR
AG Ratio: 1.8 (calc) (ref 1.0–2.5)
ALT: 10 U/L (ref 6–29)
AST: 19 U/L (ref 10–35)
Albumin: 4.1 g/dL (ref 3.6–5.1)
Alkaline phosphatase (APISO): 39 U/L (ref 37–153)
BUN: 11 mg/dL (ref 7–25)
CO2: 26 mmol/L (ref 20–32)
Calcium: 9.5 mg/dL (ref 8.6–10.4)
Chloride: 103 mmol/L (ref 98–110)
Creat: 0.75 mg/dL (ref 0.60–1.00)
Globulin: 2.3 g/dL (ref 1.9–3.7)
Glucose, Bld: 83 mg/dL (ref 65–99)
Potassium: 4.8 mmol/L (ref 3.5–5.3)
Sodium: 137 mmol/L (ref 135–146)
Total Bilirubin: 0.6 mg/dL (ref 0.2–1.2)
Total Protein: 6.4 g/dL (ref 6.1–8.1)
eGFR: 85 mL/min/{1.73_m2} (ref >=60)

## 2024-04-11 DIAGNOSIS — L570 Actinic keratosis: Secondary | ICD-10-CM | POA: Diagnosis not present

## 2024-04-11 DIAGNOSIS — Z85828 Personal history of other malignant neoplasm of skin: Secondary | ICD-10-CM | POA: Diagnosis not present

## 2024-04-11 DIAGNOSIS — L738 Other specified follicular disorders: Secondary | ICD-10-CM | POA: Diagnosis not present

## 2024-04-16 ENCOUNTER — Encounter: Payer: Self-pay | Admitting: Family Medicine

## 2024-04-16 NOTE — Progress Notes (Signed)
 See mychart note Dear Ms. Allison [name], Thank you for returning to recheck your sodium level. It has normalized. All looks well.  Sincerely, Dr. Jonelle Neri

## 2024-05-11 ENCOUNTER — Ambulatory Visit: Payer: Self-pay | Admitting: Family Medicine

## 2024-05-11 ENCOUNTER — Ambulatory Visit
Admission: RE | Admit: 2024-05-11 | Discharge: 2024-05-11 | Disposition: A | Discharge status: Home / Self Care | Source: Ambulatory Visit | Attending: Family Medicine | Admitting: Family Medicine

## 2024-05-11 DIAGNOSIS — D739 Disease of spleen, unspecified: Secondary | ICD-10-CM | POA: Diagnosis not present

## 2024-05-11 DIAGNOSIS — N281 Cyst of kidney, acquired: Secondary | ICD-10-CM | POA: Diagnosis not present

## 2024-05-11 DIAGNOSIS — N289 Disorder of kidney and ureter, unspecified: Secondary | ICD-10-CM

## 2024-05-11 MED ORDER — GADOPICLENOL 0.5 MMOL/ML IV SOLN
5.0000 mL | Freq: Once | INTRAVENOUS | Status: AC | PRN
  Administered 2024-05-11: 5 mL via INTRAVENOUS

## 2024-05-14 NOTE — Progress Notes (Signed)
 [facility] [address] [place] [address] Phone: [phone] Subjective:   IBryan Caprio, am serving as a scribe for Dr. Ronnell Coins.  I'm seeing this patient by the request  of:  Luevenia Saha, MD  CC: Back and neck pain follow-up  [id number]  Allison [name] is a 73 y.o. female coming in with complaint of back and neck pain. OMT 03/30/2024. Patient states doing well. Wants to go over MRI results. No new concerns.  Medications patient has been prescribed:   Taking:    MRI abdomen 05/11/2024 IMPRESSION: 1. The lesion of concern in the left kidney upper pole has very high precontrast T1 signal characteristics and no demonstrable enhancement compatible with a Bosniak category 2 cyst. No furtherimaging workup of this lesion is indicated. 2. Scattered small T2 hyperintense lesions in the spleen some of which may enhance and others not demonstrating enhancement. While lymphoma is not excluded, more likely these represent a benignprocess such as splenic hemangiomas or peliosis. 3. Lumbar spondylosis and degenerative disc disease most notable at L4-5 and L5-S1.      Reviewed prior external information including notes and imaging from previsou exam, outside providers and external EMR if available.   As well as notes that were available from care everywhere and other healthcare systems.  Past medical history, social, surgical and family history all reviewed in electronic medical record.  No pertanent information unless stated regarding to the chief complaint.   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 DEXA 03/2020 Osteoporosis T = -2.5 at L1-2, lowest, femurs osteopenia, on  fosamax . Continue fosamax .    Personal history of chemotherapy    Personal history of radiation therapy    PONV (postoperative nausea and vomiting)     Allergies  Allergen Reactions   Penicillins     Childhood allergy   Sulfa Antibiotics Hives and Swelling     Review of Systems:  No headache, visual changes, nausea, vomiting, diarrhea, constipation, dizziness, abdominal pain, skin rash, fevers, chills, night sweats, weight loss, swollen lymph nodes, body aches, joint swelling, chest pain, shortness of breath, mood changes. POSITIVE muscle aches  Objective  Blood pressure 110/66, pulse 63, height 5\' 4"  (1.626 m), weight 115 lb (52.2 kg), SpO2 97%.   General: No apparent distress alert and oriented x3 mood and affect normal, dressed appropriately.  HEENT: Pupils equal, extraocular movements intact  Respiratory: Patient's speak in full sentences and does not appear short of breath  Cardiovascular: No lower extremity edema, non tender, no erythema  Gait MSK:  Back does have some mild loss lordosis.  Mild scoliosis noted.  Some tightness noted with certain range of motion.  Abdominal exam unlikely today does not have any masses palpated.  No significant discomfort noted on exam.  Osteopathic findings  C3 flexed rotated and side bent right T3 extended rotated and side bent right inhaled rib T6 extended rotated and side bent left L2 flexed rotated and side bent right L3 flexed rotated and side bent left Sacrum right on right       Assessment and Plan:  Abdominal pain, chronic, right lower quadrant Discussed HEP  Discussed scan and the spleen cyst at this point we will refer patient to gastroenterology for further evaluation but do believe that patient is also due for colonoscopy.  Patient's abdominal pain does seem to be somewhat better.   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  White blood cell count has been normal.  Hold on any hematology     Nonallopathic problems  Decision today to treat with OMT was based on Physical Exam  After verbal consent patient was treated with HVLA, ME, FPR techniques in cervical, rib, thoracic, lumbar, and sacral  areas avoided HVLA on the neck  Patient tolerated the procedure well with improvement in symptoms  Patient given exercises, stretches and lifestyle modifications  See medications in patient instructions if given  Patient will follow up in 4-8 weeks    The above documentation has been reviewed and is accurate and complete Allison Margo, DO          Note: This dictation was prepared with Dragon dictation along with smaller phrase technology. Any transcriptional errors that result from this process are unintentional.

## 2024-05-15 ENCOUNTER — Encounter: Payer: Self-pay | Admitting: Family Medicine

## 2024-05-15 ENCOUNTER — Ambulatory Visit (INDEPENDENT_AMBULATORY_CARE_PROVIDER_SITE_OTHER): Admitting: Family Medicine

## 2024-05-15 VITALS — BP 110/66 | HR 63 | Ht 64.0 in | Wt 115.0 lb

## 2024-05-15 DIAGNOSIS — M9903 Segmental and somatic dysfunction of lumbar region: Secondary | ICD-10-CM | POA: Diagnosis not present

## 2024-05-15 DIAGNOSIS — K219 Gastro-esophageal reflux disease without esophagitis: Secondary | ICD-10-CM | POA: Diagnosis not present

## 2024-05-15 DIAGNOSIS — G8929 Other chronic pain: Secondary | ICD-10-CM

## 2024-05-15 DIAGNOSIS — M9904 Segmental and somatic dysfunction of sacral region: Secondary | ICD-10-CM | POA: Diagnosis not present

## 2024-05-15 DIAGNOSIS — M5126 Other intervertebral disc displacement, lumbar region: Secondary | ICD-10-CM

## 2024-05-15 DIAGNOSIS — M9902 Segmental and somatic dysfunction of thoracic region: Secondary | ICD-10-CM | POA: Diagnosis not present

## 2024-05-15 DIAGNOSIS — M9908 Segmental and somatic dysfunction of rib cage: Secondary | ICD-10-CM | POA: Diagnosis not present

## 2024-05-15 DIAGNOSIS — R1031 Right lower quadrant pain: Secondary | ICD-10-CM | POA: Diagnosis not present

## 2024-05-15 DIAGNOSIS — D734 Cyst of spleen: Secondary | ICD-10-CM

## 2024-05-15 DIAGNOSIS — M9901 Segmental and somatic dysfunction of cervical region: Secondary | ICD-10-CM | POA: Diagnosis not present

## 2024-05-15 NOTE — Assessment & Plan Note (Signed)
 Discussed HEP  Discussed scan and the spleen cyst at this point we will refer patient to gastroenterology for further evaluation but do believe that patient is also due for colonoscopy.  Patient's abdominal pain does seem to be somewhat better.

## 2024-05-15 NOTE — Assessment & Plan Note (Signed)
 Referral to gastroenterology, appears to be benign.  White blood cell count has been normal.  Hold on any hematology

## 2024-05-15 NOTE — Assessment & Plan Note (Signed)
 Arthritic changes noted.  Nothing that seems to be extremely concerning though at the moment.  Responding well to osteopathic manipulation.  Patient will have further workup for the spleen and is due for her colonoscopy.  May need to consider referral to hematology which we will discuss at next follow-up.

## 2024-05-15 NOTE — Patient Instructions (Addendum)
 Referral GI Other wise doing great See you again in 8 weeks

## 2024-05-18 ENCOUNTER — Encounter: Payer: Self-pay | Admitting: Family Medicine

## 2024-05-28 ENCOUNTER — Encounter: Payer: Self-pay | Admitting: Internal Medicine

## 2024-06-05 ENCOUNTER — Ambulatory Visit (INDEPENDENT_AMBULATORY_CARE_PROVIDER_SITE_OTHER): Payer: Medicare Other

## 2024-06-05 VITALS — Ht 64.0 in | Wt 115.0 lb

## 2024-06-05 DIAGNOSIS — M810 Age-related osteoporosis without current pathological fracture: Secondary | ICD-10-CM | POA: Diagnosis not present

## 2024-06-05 DIAGNOSIS — Z0000 Encounter for general adult medical examination without abnormal findings: Secondary | ICD-10-CM | POA: Diagnosis not present

## 2024-06-05 NOTE — Progress Notes (Signed)
 Subjective:   Allison Allison [name] is a 73 y.o. who presents for a Medicare Wellness preventive visit.  As a reminder, Annual Wellness Visits Allison [name]'t include a physical exam, and some assessments may be limited, especially if this visit is performed virtually. We may recommend an in-person follow-up visit with your provider if needed.  Visit Complete: Virtual I connected with  Allison Allison [name] on 06/05/24 by a audio enabled telemedicine application and verified that I am speaking with the correct person using two identifiers.  Patient Location: Home  Provider Location: Office/Clinic  I discussed the limitations of evaluation and management by telemedicine. The patient expressed understanding and agreed to proceed.  Vital Signs: Because this visit was a virtual/telehealth visit, some criteria may be missing or patient reported. Any vitals not documented were not able to be obtained and vitals that have been documented are patient reported.  VideoDeclined- This patient declined Librarian, academic. Therefore the visit was completed with audio only.  Persons Participating in Visit: Patient.  AWV Questionnaire: Yes: Patient Medicare AWV questionnaire was completed by the patient on 06/04/24; I have confirmed that all information answered by patient is correct and no changes since this date.  Cardiac Risk Factors include: advanced age (>43men, >36 women)     Objective:     Today's Vitals   06/05/24 0910  Weight: 115 lb (52.2 kg)  Height: 5\' 4"  (1.626 m)   Body mass index is 19.74 kg/m.     06/05/2024    9:15 AM 05/30/2023    8:39 AM 05/10/2022   10:23 AM 05/04/2021   10:32 AM 05/06/2020   10:42 AM 05/01/2020   12:04 PM 04/15/2020   10:22 AM  Advanced Directives  Does Patient Have a Medical Advance Directive? Yes Yes Yes Yes Yes Yes Yes  Type of Estate agent of [place];Living will Healthcare Power of [place];Living will Healthcare  Power of [place];Living will Healthcare Power of [employer] of [place];Living will  Living will;Healthcare Power of Attorney  Does patient want to make changes to medical advance directive?     No - Patient declined No - Patient declined No - Patient declined  Copy of Healthcare Power of Attorney in Chart? No - copy requested No - copy requested No - copy requested No - copy requested No - copy requested No - copy requested No - copy requested    Current Medications (verified) Outpatient Encounter Medications as of 06/05/2024  Medication Sig   alendronate  (FOSAMAX ) 70 MG tablet TAKE 1 TABLET WEEKLY ON AN EMPTY STOMACH WITH A FULL  GLASS OF WATER.   Calcium Citrate-Vitamin D  (CALCIUM + D PO) Take by mouth.   cetirizine  (ZYRTEC  ALLERGY) 10 MG tablet Take 1 tablet (10 mg total) by mouth at bedtime.   diclofenac  sodium (VOLTAREN ) 1 % GEL Apply 2 grams topically to affected area qid   fluticasone  (FLONASE ) 50 MCG/ACT nasal spray Place 1 spray into both nostrils daily.   gabapentin  (NEURONTIN ) 100 MG capsule Take 2 capsules (200 mg total) by mouth 2 (two) times daily.   MAGNESIUM PO Take by mouth.   meloxicam  (MOBIC ) 15 MG tablet Take 1 tablet (15 mg total) by mouth daily.   Multiple Vitamins-Minerals (MULTIVIT/MULTIMINERAL ADULT PO) Take by mouth.   NON FORMULARY Tart cherry extract   ondansetron  (ZOFRAN ) 4 MG tablet Take 1 tablet (4 mg total) by mouth every 8 (eight) hours as needed for nausea or vomiting.   sertraline  (ZOLOFT ) 25 MG  tablet TAKE 1 TABLET DAILY   Turmeric 500 MG TABS Take by mouth.   Vitamin D , Cholecalciferol, 10 MCG (400 UNIT) TABS SMARTSIG:1 Tablet(s) By Mouth   No facility-administered encounter medications on file as of 06/05/2024.    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 DEXA 03/2020 Osteoporosis T = -2.5 at L1-2, lowest, femurs osteopenia, on fosamax . Continue fosamax .    Personal history of chemotherapy    Personal history of radiation therapy    PONV (postoperative nausea and vomiting)    Past Surgical History:  Procedure Laterality Date   ABDOMINAL HYSTERECTOMY     BREAST BIOPSY Left 1999   BREAST LUMPECTOMY Left 2000   OPEN REDUCTION INTERNAL FIXATION (ORIF) METACARPAL Left 05/06/2020   Procedure: OPEN REDUCTION INTERNAL FIXATION (ORIF) LEFT THUMB METACARPAL FRACTURE;  Surgeon: Brunilda Capra, MD;  Location: [facility];  Service: Orthopedics;  Laterality: Left;  block in preop   TONSILECTOMY/ADENOIDECTOMY WITH MYRINGOTOMY     TONSILLECTOMY     Family History  Problem Relation Age of Onset   Alcohol abuse Mother    Cancer Mother    Alcohol abuse Father    Depression Father    Early death Father    Alcohol abuse Brother    Cancer Brother    Breast cancer Neg Hx    Social History   Socioeconomic History   Marital status: Married    Spouse name: Not on file   Number of children: 0   Years of education: Not on file   Highest education level: Master's degree (e.g., MA, MS, MEng, MEd, MSW, MBA)  Occupational History   Occupation: Retired     Comment: professor @ [employer]  Tobacco Use   Smoking status: Former    Current packs/day: 0.00    Types: Cigarettes    Quit date: 12/27/1972    Years since quitting: 51.4   Smokeless tobacco: Never  Vaping Use   Vaping status: Never Used  Substance and Sexual Activity   Alcohol use: Yes    Comment: socially   Drug use: Never   Sexual activity: Not on file  Other Topics Concern   Not on file  Social History Narrative   Pets:  2 cats adopted at the start of Covid    Social Drivers of Health   Financial Resource  Strain: Low Risk  (06/04/2024)   Overall Financial Resource Strain (CARDIA)    Difficulty of Paying Living Expenses: Not hard at all  Food Insecurity: No Food Insecurity (06/04/2024)   Hunger Vital Sign    Worried About Running Out of Food in the Last Year: Never true    Ran Out of Food in the Last Year: Never true  Transportation Needs: No Transportation Needs (06/04/2024)   PRAPARE - Administrator, Civil Service (Medical): No    Lack of Transportation (Non-Medical): No  Physical Activity: Sufficiently Active (06/04/2024)   Exercise Vital Sign    Days of Exercise per Week: 6 days    Minutes of Exercise per Session: 60 min  Stress: No Stress Concern Present (06/04/2024)  [name]    Feeling of Stress : Not at all  Social Connections: Moderately Integrated (06/04/2024)   Social Connection and Isolation Panel [NHANES]    Frequency of Communication with Friends and Family: Once a week    Frequency of Social Gatherings with Friends and Family: Once a week    Attends Religious Services: More than 4 times per year    Active Member of [employer] or Organizations: Yes    Attends Engineer, structural: More than 4 times per year    Marital Status: Married    Tobacco Counseling Counseling given: Not Answered    Clinical Intake:  Pre-visit preparation completed: Yes  Pain : No/denies pain     BMI - recorded: 19.74 Nutritional Status: BMI of 19-24  Normal Nutritional Risks: None Diabetes: No  No results found for: "HGBA1C"   How often do you need to have someone help you when you read instructions, pamphlets, or other written materials from your doctor or pharmacy?: 1 - Never  Interpreter Needed?: No  Information entered by :: Lamont Pilsner, LPN   Activities of Daily Living     06/05/2024    9:12 AM  In your present state of health, do you have any difficulty performing the following activities:   Hearing? 0  Vision? 0  Difficulty concentrating or making decisions? 0  Walking or climbing stairs? 0  Dressing or bathing? 0  Doing errands, shopping? 0  Preparing Food and eating ? N  Using the Toilet? N  In the past six months, have you accidently leaked urine? N  Do you have problems with loss of bowel control? N  Managing your Medications? N  Managing your Finances? N  Housekeeping or managing your Housekeeping? N    Patient Care Team: Luevenia Saha, MD as PCP - General (Family Medicine) Dorisann Garre, Thornell Flirt, MD as Referring Physician (Dermatology) Cindra Cree, MD as Consulting Physician (Ophthalmology) Dr. Venson Ginger, DDS as Consulting Physician (Dentistry) Luellen Sages, AUD as Consulting Physician (Audiology) Isidro Margo, DO as Consulting Physician (Sports Medicine)  I have updated your Care Teams any recent Medical Services you may have received from other providers in the past year.     Assessment:    This is a routine wellness examination for Hend.  Hearing/Vision screen Hearing Screening - Comments:: Pt wears hearing aids  Vision Screening - Comments:: Wears rx glasses - up to date with routine eye exams with Dr Ambrosio Junker at [facility] ophthalmology     Goals Addressed             This Visit's Progress    Patient Stated       Maintain health and activity        Depression Screen     06/05/2024    9:13 AM 01/06/2024    8:55 AM 12/09/2023    9:59 AM 05/30/2023    8:38 AM 05/20/2023   10:50 AM 01/04/2023    8:30 AM 08/31/2022   11:02 AM  PHQ 2/9 Scores  PHQ - 2 Score 0 0 0 0 0 0 0    Fall Risk     06/05/2024    9:15 AM 01/06/2024    8:55 AM 12/09/2023    9:59 AM 05/27/2023    9:01 AM 05/20/2023   10:50 AM  Fall Risk   Falls in the past year? 0 0 0 0 0  Number falls in past yr: 0 0 0  0  Injury with Fall? 0 0 0 0 0  Risk for fall due to : No Fall Risks No Fall Risks No Fall Risks Impaired vision No Fall Risks  Follow up Falls  prevention discussed Falls evaluation completed Falls evaluation completed Falls prevention discussed Falls evaluation completed    MEDICARE RISK AT HOME:  Medicare Risk at Home Any stairs in or around the home?: Yes If so, are there any without handrails?: No Home free of loose throw rugs in walkways, pet beds, electrical cords, etc?: Yes Adequate lighting in your home to reduce risk of falls?: Yes Life alert?: Yes Use of a cane, walker or w/c?: No Grab bars in the bathroom?: Yes Shower chair or bench in shower?: Yes Elevated toilet seat or a handicapped toilet?: No  TIMED UP AND GO:  Was the test performed?  No  Cognitive Function: 6CIT completed        06/05/2024    9:16 AM 05/30/2023    8:41 AM 05/10/2022   10:27 AM 05/04/2021   10:37 AM 04/15/2020   10:23 AM  6CIT Screen  What Year? 0 points 0 points 0 points 0 points 0 points  What month? 0 points 0 points 0 points 0 points 0 points  What time? 0 points 0 points 0 points  0 points  Count back from 20 0 points 0 points 0 points 0 points 0 points  Months in reverse 0 points 0 points 0 points 0 points 0 points  Repeat phrase 0 points 0 points 0 points 0 points 0 points  Total Score 0 points 0 points 0 points  0 points    Immunizations Immunization History  Administered Date(s) Administered   Fluad Quad(high Dose 65+) 09/04/2019, 09/04/2020, 10/07/2021, 09/10/2022   Fluad Trivalent(High Dose 65+) 09/11/2023   Influenza-Unspecified 02/07/2015, 09/02/2018   PFIZER(Purple Top)SARS-COV-2 Vaccination 01/15/2020, 02/05/2020, 09/30/2020   PNEUMOCOCCAL CONJUGATE-20 01/04/2022   Pfizer Covid-19 Vaccine Bivalent Booster 36yrs & up 04/22/2021   Pneumococcal-Unspecified 03/15/2018   Respiratory Syncytial Virus Vaccine,Recomb Aduvanted(Arexvy) 09/10/2022   Tdap 02/11/2016   Zoster Recombinant(Shingrix) 09/07/2011, 01/06/2019, 05/08/2019    Screening Tests Health Maintenance  Topic Date Due   COVID-19 Vaccine (5 - 2024-25  season) 08/28/2023   DEXA SCAN  06/02/2024   INFLUENZA VACCINE  07/27/2024   MAMMOGRAM  02/27/2025   Medicare Annual Wellness (AWV)  06/05/2025   Colonoscopy  12/07/2025   DTaP/Tdap/Td (2 - Td or Tdap) 02/10/2026   Pneumonia Vaccine 70+ Years old  Completed   Hepatitis C Screening  Completed   Zoster Vaccines- Shingrix  Completed   HPV VACCINES  Aged Out   Meningococcal B Vaccine  Aged Out    Health Maintenance  Health Maintenance Due  Topic Date Due   COVID-19 Vaccine (5 - 2024-25 season) 08/28/2023   DEXA SCAN  06/02/2024   Health Maintenance Items Addressed: See Nurse Notes at the end of this note  Additional Screening:  Vision Screening: Recommended annual ophthalmology exams for early detection of glaucoma and other disorders of the eye. Would you like a referral to an eye doctor? No    Dental Screening: Recommended annual dental exams for proper oral hygiene  Community Resource Referral / Chronic Care Management: CRR required this visit?  No   CCM required this visit?  No   Plan:    I have personally reviewed and noted the following in the patient's chart:   Medical and social history Use of alcohol, tobacco or illicit drugs  Current  medications and supplements including opioid prescriptions. Patient is not currently taking opioid prescriptions. Functional ability and status Nutritional status Physical activity Advanced directives List of other physicians Hospitalizations, surgeries, and ER visits in previous 12 months Vitals Screenings to include cognitive, depression, and falls Referrals and appointments  In addition, I have reviewed and discussed with patient certain preventive protocols, quality metrics, and best practice recommendations. A written personalized care plan for preventive services as well as general preventive health recommendations were provided to patient.   Bruno Capri, LPN   1/61/0960   After Visit Summary: (MyChart) Due to  this being a telephonic visit, the after visit summary with patients personalized plan was offered to patient via MyChart   Notes: Nothing significant to report at this time.

## 2024-06-05 NOTE — Patient Instructions (Signed)
 Allison [name] , Thank you for taking time out of your busy schedule to complete your Annual Wellness Visit with me. I enjoyed our conversation and look forward to speaking with you again next year. I, as well as your care team,  appreciate your ongoing commitment to your health goals. Please review the following plan we discussed and let me know if I can assist you in the future. Your Game plan/ To Do List    Referrals: If you haven't heard from the office you've been referred to, please reach out to them at the phone provided.   Follow up Visits: Next Medicare AWV with our clinical staff: 06/10/25   Have you seen your provider in the last 6 months (3 months if uncontrolled diabetes)? Yes Next Office Visit with your provider: 12/2024 yearly   Clinician Recommendations:  Aim for 30 minutes of exercise or brisk walking, 6-8 glasses of water, and 5 servings of fruits and vegetables each day.       This is a list of the screening recommended for you and due dates:  Health Maintenance  Topic Date Due   COVID-19 Vaccine (5 - 2024-25 season) 08/28/2023   Medicare Annual Wellness Visit  05/29/2024   DEXA scan (bone density measurement)  06/02/2024   Flu Shot  07/27/2024   Mammogram  02/27/2025   Colon Cancer Screening  12/07/2025   DTaP/Tdap/Td vaccine (2 - Td or Tdap) 02/10/2026   Pneumonia Vaccine  Completed   Hepatitis C Screening  Completed   Zoster (Shingles) Vaccine  Completed   HPV Vaccine  Aged Out   Meningitis B Vaccine  Aged Out    Advanced directives: (Copy Requested) Please bring a copy of your health care power of attorney and living will to the office to be added to your chart at your convenience. You can mail to [facility] [address] [place], [place] [address] or email to [email or web address] Advance Care Planning is important because it:  You have an order for:  []   2D Mammogram  []   3D Mammogram  [x]   Bone Density     Please call for  appointment:  [facility] [address] [place], [place] [address] [phone]    Make sure to wear two-piece clothing.  No lotions, powders, or deodorants the day of the appointment. Make sure to bring picture ID and insurance card.  Bring list of medications you are currently taking including any supplements.    [x]  Makes sure you receive the medical care that is consistent with your values, goals, and preferences  [x]  It provides guidance to your family and loved ones and reduces their decisional burden about whether or not they are making the right decisions based on your wishes.  Follow the link provided in your after visit summary or read over the paperwork we have mailed to you to help you started getting your Advance Directives in place. If you need assistance in completing these, please reach out to us  so that we can help you!  See attachments for Preventive Care and Fall Prevention Tips.

## 2024-06-22 ENCOUNTER — Other Ambulatory Visit: Payer: Self-pay | Admitting: Medical Genetics

## 2024-06-26 ENCOUNTER — Other Ambulatory Visit (HOSPITAL_COMMUNITY)
Admission: RE | Admit: 2024-06-26 | Discharge: 2024-06-26 | Disposition: A | Discharge status: Home / Self Care | Payer: Self-pay | Source: Ambulatory Visit | Attending: Medical Genetics | Admitting: Medical Genetics

## 2024-07-10 ENCOUNTER — Ambulatory Visit: Admitting: Family Medicine

## 2024-07-11 LAB — GENECONNECT MOLECULAR SCREEN: Genetic Analysis Overall Interpretation: NEGATIVE

## 2024-07-19 ENCOUNTER — Ambulatory Visit: Admitting: Internal Medicine

## 2024-07-19 ENCOUNTER — Encounter: Payer: Self-pay | Admitting: Internal Medicine

## 2024-07-19 VITALS — BP 118/68 | HR 73 | Ht 64.0 in | Wt 114.0 lb

## 2024-07-19 DIAGNOSIS — Z1211 Encounter for screening for malignant neoplasm of colon: Secondary | ICD-10-CM | POA: Diagnosis not present

## 2024-07-19 DIAGNOSIS — R079 Chest pain, unspecified: Secondary | ICD-10-CM | POA: Diagnosis not present

## 2024-07-19 NOTE — Patient Instructions (Signed)
 You have been scheduled for an endoscopy. Please follow written instructions given to you at your visit today.  If you use inhalers (even only as needed), please bring them with you on the day of your procedure.  If you take any of the following medications, they will need to be adjusted prior to your procedure:   DO NOT TAKE 7 DAYS PRIOR TO TEST- Trulicity (dulaglutide) Ozempic, Wegovy (semaglutide) Mounjaro (tirzepatide) Bydureon Bcise (exanatide extended release)  DO NOT TAKE 1 DAY PRIOR TO YOUR TEST Rybelsus (semaglutide) Adlyxin (lixisenatide) Victoza (liraglutide) Byetta (exanatide) ___________________________________________________________________________  _______________________________________________________  If your blood pressure at your visit was 140/90 or greater, please contact your primary care physician to follow up on this.  _______________________________________________________  If you are age 73 or older, your body mass index should be between 23-30. Your Body mass index is 19.57 kg/m. If this is out of the aforementioned range listed, please consider follow up with your Primary Care Provider.  If you are age 73 or younger, your body mass index should be between 19-25. Your Body mass index is 19.57 kg/m. If this is out of the aformentioned range listed, please consider follow up with your Primary Care Provider.   ________________________________________________________  The [facility] providers would like to encourage you to use MYCHART to communicate with providers for non-urgent requests or questions.  Due to long hold times on the telephone, sending your provider a message by [facility] may be a faster and more efficient way to get a response.  Please allow 48 business hours for a response.  Please remember that this is for non-urgent requests.  _______________________________________________________  [facility] is using a team-based approach to  care.  Your team is made up of your doctor and two to three APPS. Our APPS (Nurse Practitioners and Physician Assistants) work with your physician to ensure care continuity for you. They are fully qualified to address your health concerns and develop a treatment plan. They communicate directly with your gastroenterologist to care for you. Seeing the Advanced Practice Practitioners on your physician's team can help you by facilitating care more promptly, often allowing for earlier appointments, access to diagnostic testing, procedures, and other specialty referrals.

## 2024-07-19 NOTE — Progress Notes (Signed)
 HISTORY OF PRESENT ILLNESS:  Allison [name] is a 73 y.o. female, retired professor of architecture, who send today by Dr. Arthea Sharps regarding chronic chest pain query GERD.  Patient also has questions when she may need follow-up colonoscopy.  Patient tells me that she has a 20-year history of recurrent chest pain.  Initially diagnosed with costochondritis.  When discomfort occurs it is fairly constant for up to 2 weeks.  May abate for up to 2 months.  The discomfort is often radiated around and into her back.  She denies that the discomfort is affected by meals.  No dysphagia.  No reflux symptoms such as pyrosis, waterbrash, or regurgitation.  She has not been on empiric PPI.  Review of the outside records show that the patient had a colonoscopy in 2016 while living in [place] .  She had a diminutive hyperplastic polyp.  No adenomatous polyps.  Her colon was described as tortuous.  She was noted to have right-sided diverticulosis.  She does have occasional constipation.  GI review of systems is otherwise negative  Laboratories: Blood work from April 09, 2024 shows unremarkable comprehensive metabolic panel.  CBC January 2025 revealed normal hemoglobin 13.6. X-rays: MRI of the abdomen with and without contrast May 11, 2024.  No intra-abdominal abnormalities.  Benign findings as noted  REVIEW OF SYSTEMS:  All non-GI ROS negative unless otherwise stated in the HPI.  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 DEXA 03/2020 Osteoporosis T = -2.5 at L1-2, lowest, femurs osteopenia, on fosamax . Continue fosamax .    Personal history of chemotherapy    Personal history of radiation therapy    PONV (postoperative nausea and vomiting)      Past Surgical History:  Procedure Laterality Date   ABDOMINAL HYSTERECTOMY     BREAST BIOPSY Left 1999   BREAST LUMPECTOMY Left 2000   OPEN REDUCTION INTERNAL FIXATION (ORIF) METACARPAL Left 05/06/2020   Procedure: OPEN REDUCTION INTERNAL FIXATION (ORIF) LEFT THUMB METACARPAL FRACTURE;  Surgeon: Murrell Drivers, MD;  Location: [facility];  Service: Orthopedics;  Laterality: Left;  block in preop   TONSILECTOMY/ADENOIDECTOMY WITH MYRINGOTOMY     TONSILLECTOMY      Social History Allison [name]  reports that she quit smoking about 51 years ago. Her smoking use included cigarettes. She has never used smokeless tobacco. She reports current alcohol use. She reports that she does not use drugs.  family history includes Alcohol abuse in her brother, father, and mother; Cancer in her brother and mother; Depression in her father; Early death in her father.  Allergies  Allergen Reactions   Penicillins     Childhood allergy   Sulfa Antibiotics Hives and Swelling       PHYSICAL EXAMINATION: Vital signs: BP 118/68   Pulse 73   Ht 5' 4 (1.626 m)   Wt 114 lb (51.7 kg)   BMI 19.57 kg/m   Constitutional: generally well-appearing, no acute distress Psychiatric: alert and oriented x3, cooperative Eyes: extraocular movements intact, anicteric, conjunctiva pink Mouth: oral pharynx moist, no lesions Neck: supple no lymphadenopathy Cardiovascular: heart regular rate and rhythm, no murmur Lungs: clear to auscultation bilaterally Abdomen: soft, nontender,  nondistended, no obvious ascites, no peritoneal signs, normal bowel sounds, no organomegaly Rectal: Omitted Extremities: no clubbing, cyanosis, or lower extremity edema bilaterally Skin: no lesions on visible extremities Neuro: No focal deficits.  Cranial nerves intact  ASSESSMENT:  1.  Chronic chest pain of uncertain etiology.  May be musculoskeletal.  Query upper GI cause 2.  Colonoscopy elsewhere August 2016 without  neoplasia   PLAN:  1.  Diagnostic upper endoscopy to further evaluate unexplained chest pain.The nature of the procedure, as well as the risks, benefits, and alternatives were carefully and thoroughly reviewed with the patient. Ample time for discussion and questions allowed. The patient understood, was satisfied, and agreed to proceed. 2.  Plan for recall colonoscopy around August 2026 3.  Ongoing general medical care with PCP and other specialists Total time of 45 minutes was spent preparing to see the patient, reviewing multiple records, obtaining comprehensive history, performing medically appropriate physical exam, counseling and educating the patient regarding the above listed issues, ordering endoscopic procedure, and documenting clinical information in the health record

## 2024-07-23 NOTE — Progress Notes (Unsigned)
 [name] [address] [place] [address] Phone: [phone] Subjective:   Allison [name], am serving as a scribe for Dr. Arthea [name].  I'm seeing this patient by the request  of:  Allison Lavern CROME, MD  CC: Back and neck pain follow-up  [name]:Dlagzrupcz  Allison [name] is a 73 y.o. female coming in with complaint of back and neck pain. OMT on 05/15/2024. Patient states that she continues to have some chest pain that was improving but seems to have flared up. Having endoscopy on Friday. Pain waking her up at night. No pain with palpation but more so with movement like lumbar flexion.   Medications patient has been prescribed: Gabapentin   Taking:      Patient has seen GI and is going to be scheduled for a upper endoscopy.   Reviewed prior external information including notes and imaging from previsou exam, outside providers and external EMR if available.   As well as notes that were available from care everywhere and other healthcare systems.  Past medical history, social, surgical and family history all reviewed in electronic medical record.  No pertanent information unless stated regarding to the chief complaint.   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 DEXA 03/2020 Osteoporosis T = -2.5 at L1-2, lowest, femurs osteopenia, on fosamax . Continue fosamax .    Personal history of chemotherapy    Personal history of radiation therapy    PONV (postoperative nausea and vomiting)     Allergies  Allergen Reactions   Penicillins     Childhood allergy   Sulfa Antibiotics Hives and Swelling     Review of Systems:  No headache, visual changes, nausea, vomiting, diarrhea, constipation,  dizziness, skin rash, fevers, chills, night sweats, weight loss, swollen lymph nodes, body aches, joint swelling, chest pain, shortness of breath, mood changes. POSITIVE muscle aches, abdominal pain  Objective  Blood pressure 118/82, height 5' 4 (1.626 m), weight 116 lb (52.6 kg).   General: No apparent distress alert and oriented x3 mood and affect normal, dressed appropriately.  HEENT: Pupils equal, extraocular movements intact  Respiratory: Patient's speak in full sentences and does not appear short of breath  Cardiovascular: No lower extremity edema, non tender, no erythema  Gait relatively normal MSK:  Back mild loss lordosis.  Patient's chest exam is just minorly painful over the sternum itself.  Osteopathic findings  C2 flexed rotated and side bent right C4 flexed rotated and side bent left T3 extended rotated and side bent right inhaled rib T9 extended rotated and side bent left L2 flexed rotated and side bent right Sacrum right on right       Assessment and Plan:  Osteoporosis of lumbar spine Continue to work on core strengthening.  Has any hip arthritis that can be contributing as well.  Still trying to figure out the chronic abdominal pain chronic chest discomfort.  We discussed that if the EGD does not show any findings I do want to consider a CT chest for further evaluation.  Patient is in agreement with this.  Past medical history is negative for breast cancer.  Prior workup has been unremarkable.  Follow-up again in 2 to 3 months    Nonallopathic problems  Decision today to treat with OMT was based on Physical Exam  After verbal consent patient was treated with  ME, FPR techniques in cervical, rib, thoracic, lumbar, and sacral  areas avoided HVLA today.  Patient tolerated the procedure well with improvement in symptoms  Patient given exercises, stretches and lifestyle modifications  See medications in patient instructions if given  Patient will follow up in  4-8 weeks     The above documentation has been reviewed and is accurate and complete Allison [name] M Allison Ambrosia, DO         Note: This dictation was prepared with Dragon dictation along with smaller phrase technology. Any transcriptional errors that result from this process are unintentional.

## 2024-07-24 ENCOUNTER — Encounter: Payer: Self-pay | Admitting: Family Medicine

## 2024-07-24 ENCOUNTER — Ambulatory Visit (INDEPENDENT_AMBULATORY_CARE_PROVIDER_SITE_OTHER): Admitting: Family Medicine

## 2024-07-24 VITALS — BP 118/82 | Ht 64.0 in | Wt 116.0 lb

## 2024-07-24 DIAGNOSIS — M9908 Segmental and somatic dysfunction of rib cage: Secondary | ICD-10-CM | POA: Diagnosis not present

## 2024-07-24 DIAGNOSIS — M9903 Segmental and somatic dysfunction of lumbar region: Secondary | ICD-10-CM

## 2024-07-24 DIAGNOSIS — M5126 Other intervertebral disc displacement, lumbar region: Secondary | ICD-10-CM

## 2024-07-24 DIAGNOSIS — R1031 Right lower quadrant pain: Secondary | ICD-10-CM | POA: Diagnosis not present

## 2024-07-24 DIAGNOSIS — G8929 Other chronic pain: Secondary | ICD-10-CM | POA: Diagnosis not present

## 2024-07-24 DIAGNOSIS — R1013 Epigastric pain: Secondary | ICD-10-CM

## 2024-07-24 DIAGNOSIS — M9904 Segmental and somatic dysfunction of sacral region: Secondary | ICD-10-CM | POA: Diagnosis not present

## 2024-07-24 DIAGNOSIS — M9902 Segmental and somatic dysfunction of thoracic region: Secondary | ICD-10-CM | POA: Diagnosis not present

## 2024-07-24 DIAGNOSIS — M810 Age-related osteoporosis without current pathological fracture: Secondary | ICD-10-CM

## 2024-07-24 DIAGNOSIS — M9901 Segmental and somatic dysfunction of cervical region: Secondary | ICD-10-CM | POA: Diagnosis not present

## 2024-07-24 NOTE — Patient Instructions (Signed)
 Good to see you! Send me update on Monday See you again in 7-8 weeks

## 2024-07-24 NOTE — Assessment & Plan Note (Addendum)
 Continue to work on core strengthening.  Has any hip arthritis that can be contributing as well.  Still trying to figure out the chronic abdominal pain chronic chest discomfort.  We discussed that if the EGD does not show any findings I do want to consider a CT chest for further evaluation.  Patient is in agreement with this.  Past medical history is negative for breast cancer.  Prior workup has been unremarkable.  Follow-up again in 2 to 3 months

## 2024-07-27 ENCOUNTER — Encounter: Payer: Self-pay | Admitting: Internal Medicine

## 2024-07-27 ENCOUNTER — Ambulatory Visit: Admitting: Internal Medicine

## 2024-07-27 VITALS — BP 138/77 | HR 50 | Temp 98.1°F | Resp 11 | Ht 64.0 in | Wt 114.0 lb

## 2024-07-27 DIAGNOSIS — F32A Depression, unspecified: Secondary | ICD-10-CM | POA: Diagnosis not present

## 2024-07-27 DIAGNOSIS — R079 Chest pain, unspecified: Secondary | ICD-10-CM | POA: Diagnosis not present

## 2024-07-27 MED ORDER — SODIUM CHLORIDE 0.9 % IV SOLN: 500.0000 mL | Freq: Once | INTRAVENOUS | Status: DC

## 2024-07-27 NOTE — Patient Instructions (Signed)
 YOU HAD AN ENDOSCOPIC PROCEDURE TODAY AT THE [facility]:   Refer to the procedure report that was given to you for any specific questions about what was found during the examination.  If the procedure report does not answer your questions, please call your gastroenterologist to clarify.  If you requested that your care partner not be given the details of your procedure findings, then the procedure report has been included in a sealed envelope for you to review at your convenience later.  YOU SHOULD EXPECT: Some feelings of bloating in the abdomen. Passage of more gas than usual.  Walking can help get rid of the air that was put into your GI tract during the procedure and reduce the bloating. If you had a lower endoscopy (such as a colonoscopy or flexible sigmoidoscopy) you may notice spotting of blood in your stool or on the toilet paper. If you underwent a bowel prep for your procedure, you may not have a normal bowel movement for a few days.  Please Note:  You might notice some irritation and congestion in your nose or some drainage.  This is from the oxygen used during your procedure.  There is no need for concern and it should clear up in a day or so.  SYMPTOMS TO REPORT IMMEDIATELY:   Following upper endoscopy (EGD)  Vomiting of blood or coffee ground material  New chest pain or pain under the shoulder blades  Painful or persistently difficult swallowing  New shortness of breath  Fever of 100F or higher  Black, tarry-looking stools  Resume previous diet Continue present medications Return to the care of Dr. Claudene   For urgent or emergent issues, a gastroenterologist can be reached at any hour by calling [phone]. Do not use MyChart messaging for urgent concerns.    DIET:  We do recommend a small meal at first, but then you may proceed to your regular diet.  Drink plenty of fluids but you should avoid alcoholic beverages for 24 hours.  ACTIVITY:  You should plan  to take it easy for the rest of today and you should NOT DRIVE or use heavy machinery until tomorrow (because of the sedation medicines used during the test).    FOLLOW UP: Our staff will call the number listed on your records the next business day following your procedure.  We will call around 7:15- 8:00 am to check on you and address any questions or concerns that you may have regarding the information given to you following your procedure. If we do not reach you, we will leave a message.     If any biopsies were taken you will be contacted by phone or by letter within the next 1-3 weeks.  Please call us  at [phone] if you have not heard about the biopsies in 3 weeks.    SIGNATURES/CONFIDENTIALITY: You and/or your care partner have signed paperwork which will be entered into your electronic medical record.  These signatures attest to the fact that that the information above on your After Visit Summary has been reviewed and is understood.  Full responsibility of the confidentiality of this discharge information lies with you and/or your care-partner.

## 2024-07-27 NOTE — Op Note (Signed)
 [facility] Patient Name: Allison [name] Procedure Date: 07/27/2024 12:12 PM MRN: [id number] Endoscopist: Norleen SAILOR. Abran , MD, [id number] Age: 73 Referring MD:  Date of Birth: [birth year] Gender: Female Account #: [id number] Procedure:                Upper GI endoscopy Indications:              Unexplained chest pain (longstanding) Medicines:                Monitored Anesthesia Care Procedure:                Pre-Anesthesia Assessment:                           - Prior to the procedure, a History and Physical                            was performed, and patient medications and                            allergies were reviewed. The patient's tolerance of                            previous anesthesia was also reviewed. The risks                            and benefits of the procedure and the sedation                            options and risks were discussed with the patient.                            All questions were answered, and informed consent                            was obtained. Prior Anticoagulants: The patient has                            taken no anticoagulant or antiplatelet agents. ASA                            Grade Assessment: II - A patient with mild systemic                            disease. After reviewing the risks and benefits,                            the patient was deemed in satisfactory condition to                            undergo the procedure.                           After obtaining informed consent, the endoscope was  passed under direct vision. Throughout the                            procedure, the patient's blood pressure, pulse, and                            oxygen saturations were monitored continuously. The                            Olympus Scope [id number] was introduced through the                            mouth, and advanced to the second part of duodenum.                            The upper  GI endoscopy was accomplished without                            difficulty. The patient tolerated the procedure                            well. Scope In: Scope Out: Findings:                 The esophagus was normal.                           The stomach was normal.                           The examined duodenum was normal.                           The cardia and gastric fundus were normal on                            retroflexion. Complications:            No immediate complications. Estimated Blood Loss:     Estimated blood loss: none. Impression:               1. Normal EGD                           2. No GI cause for chest pain found Recommendation:           1. Patient has a contact number available for                            emergencies. The signs and symptoms of potential                            delayed complications were discussed with the                            patient. Return to normal activities tomorrow.  Written discharge instructions were provided to the                            patient.                           2. Resume previous diet.                           3. Continue present medications.                           4. Return to the care of Dr. Claudene Norleen SAILOR. Abran, MD 07/27/2024 12:23:40 PM This report has been signed electronically.

## 2024-07-27 NOTE — Progress Notes (Signed)
 Pt sedate, gd SR's, VSS, report to RN

## 2024-07-27 NOTE — Progress Notes (Signed)
 Expand All Collapse All HISTORY OF PRESENT ILLNESS:   Allison [name] is a 73 y.o. female, retired professor of architecture, who send today by Dr. Arthea Sharps regarding chronic chest pain query GERD.  Patient also has questions when she may need follow-up colonoscopy.   Patient tells me that she has a 20-year history of recurrent chest pain.  Initially diagnosed with costochondritis.  When discomfort occurs it is fairly constant for up to 2 weeks.  May abate for up to 2 months.  The discomfort is often radiated around and into her back.  She denies that the discomfort is affected by meals.  No dysphagia.  No reflux symptoms such as pyrosis, waterbrash, or regurgitation.  She has not been on empiric PPI.   Review of the outside records show that the patient had a colonoscopy in 2016 while living in [place] .  She had a diminutive hyperplastic polyp.  No adenomatous polyps.  Her colon was described as tortuous.  She was noted to have right-sided diverticulosis.  She does have occasional constipation.  GI review of systems is otherwise negative   Laboratories: Blood work from April 09, 2024 shows unremarkable comprehensive metabolic panel.  CBC January 2025 revealed normal hemoglobin 13.6. X-rays: MRI of the abdomen with and without contrast May 11, 2024.  No intra-abdominal abnormalities.  Benign findings as noted   REVIEW OF SYSTEMS:   All non-GI ROS negative unless otherwise stated in the HPI.       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 DEXA 03/2020 Osteoporosis T = -2.5 at L1-2, lowest, femurs osteopenia, on fosamax . Continue fosamax .    Personal history of chemotherapy     Personal history of radiation therapy      PONV (postoperative nausea and vomiting)                 Past Surgical History:  Procedure Laterality Date   ABDOMINAL HYSTERECTOMY       BREAST BIOPSY Left 1999   BREAST LUMPECTOMY Left 2000   OPEN REDUCTION INTERNAL FIXATION (ORIF) METACARPAL Left 05/06/2020    Procedure: OPEN REDUCTION INTERNAL FIXATION (ORIF) LEFT THUMB METACARPAL FRACTURE;  Surgeon: Murrell Drivers, MD;  Location: [facility];  Service: Orthopedics;  Laterality: Left;  block in preop   TONSILECTOMY/ADENOIDECTOMY WITH MYRINGOTOMY       TONSILLECTOMY              Social History Palma Buster  reports that she quit smoking about 51 years ago. Her smoking use included cigarettes. She has never used smokeless tobacco. She reports current alcohol use. She reports that she does not use drugs.   family history includes Alcohol abuse in her brother, father, and mother; Cancer in her brother and mother; Depression in her father; Early death in her father.   Allergies       Allergies  Allergen Reactions   Penicillins        Childhood allergy   Sulfa Antibiotics Hives and Swelling            PHYSICAL EXAMINATION: Vital signs: BP 118/68   Pulse 73  Ht 5' 4 (1.626 m)   Wt 114 lb (51.7 kg)   BMI 19.57 kg/m   Constitutional: generally well-appearing, no acute distress Psychiatric: alert and oriented x3, cooperative Eyes: extraocular movements intact, anicteric, conjunctiva pink Mouth: oral pharynx moist, no lesions Neck: supple no lymphadenopathy Cardiovascular: heart regular rate and rhythm, no murmur Lungs: clear to auscultation bilaterally Abdomen: soft, nontender, nondistended, no obvious ascites, no peritoneal signs, normal bowel sounds, no organomegaly Rectal: Omitted Extremities: no clubbing, cyanosis, or lower extremity edema bilaterally Skin: no lesions on visible extremities Neuro: No focal deficits.  Cranial nerves intact   ASSESSMENT:   1.  Chronic chest pain of uncertain  etiology.  May be musculoskeletal.  Query upper GI cause 2.  Colonoscopy elsewhere August 2016 without neoplasia     PLAN:   1.  Diagnostic upper endoscopy to further evaluate unexplained chest pain.The nature of the procedure, as well as the risks, benefits, and alternatives were carefully and thoroughly reviewed with the patient. Ample time for discussion and questions allowed. The patient understood, was satisfied, and agreed to proceed. 2.  Plan for recall colonoscopy around August 2026 3.  Ongoing general medical care with PCP and other specialists

## 2024-07-29 ENCOUNTER — Encounter: Payer: Self-pay | Admitting: Family Medicine

## 2024-07-30 ENCOUNTER — Other Ambulatory Visit: Payer: Self-pay

## 2024-07-30 ENCOUNTER — Telehealth: Payer: Self-pay | Admitting: *Deleted

## 2024-07-30 DIAGNOSIS — R079 Chest pain, unspecified: Secondary | ICD-10-CM

## 2024-07-30 NOTE — Telephone Encounter (Signed)
  Follow up Call-     07/27/2024   11:18 AM  Call back number  Post procedure Call Back phone  # [phone]  Permission to leave phone message Yes   Left message on machine to call back if any questions or concerns

## 2024-08-01 DIAGNOSIS — Z85828 Personal history of other malignant neoplasm of skin: Secondary | ICD-10-CM | POA: Diagnosis not present

## 2024-08-01 DIAGNOSIS — K130 Diseases of lips: Secondary | ICD-10-CM | POA: Diagnosis not present

## 2024-08-01 DIAGNOSIS — D485 Neoplasm of uncertain behavior of skin: Secondary | ICD-10-CM | POA: Diagnosis not present

## 2024-08-01 DIAGNOSIS — B079 Viral wart, unspecified: Secondary | ICD-10-CM | POA: Diagnosis not present

## 2024-08-01 DIAGNOSIS — L249 Irritant contact dermatitis, unspecified cause: Secondary | ICD-10-CM | POA: Diagnosis not present

## 2024-08-06 ENCOUNTER — Ambulatory Visit
Admission: RE | Admit: 2024-08-06 | Discharge: 2024-08-06 | Disposition: A | Discharge status: Home / Self Care | Source: Ambulatory Visit | Attending: Family Medicine | Admitting: Family Medicine

## 2024-08-06 DIAGNOSIS — R918 Other nonspecific abnormal finding of lung field: Secondary | ICD-10-CM | POA: Diagnosis not present

## 2024-08-06 DIAGNOSIS — R079 Chest pain, unspecified: Secondary | ICD-10-CM

## 2024-08-06 MED ORDER — IOPAMIDOL (ISOVUE-300) INJECTION 61%
75.0000 mL | Freq: Once | INTRAVENOUS | Status: AC | PRN
  Administered 2024-08-06 (×2): 75 mL via INTRAVENOUS

## 2024-08-10 ENCOUNTER — Ambulatory Visit: Payer: Self-pay | Admitting: Family Medicine

## 2024-08-30 ENCOUNTER — Ambulatory Visit (HOSPITAL_BASED_OUTPATIENT_CLINIC_OR_DEPARTMENT_OTHER)
Admission: RE | Admit: 2024-08-30 | Discharge: 2024-08-30 | Disposition: A | Discharge status: Home / Self Care | Source: Ambulatory Visit | Attending: Family Medicine | Admitting: Family Medicine

## 2024-08-30 DIAGNOSIS — M810 Age-related osteoporosis without current pathological fracture: Secondary | ICD-10-CM | POA: Insufficient documentation

## 2024-08-30 DIAGNOSIS — M8589 Other specified disorders of bone density and structure, multiple sites: Secondary | ICD-10-CM | POA: Diagnosis not present

## 2024-08-30 DIAGNOSIS — Z780 Asymptomatic menopausal state: Secondary | ICD-10-CM | POA: Diagnosis not present

## 2024-09-17 ENCOUNTER — Encounter: Payer: Self-pay | Admitting: Family Medicine

## 2024-09-17 ENCOUNTER — Ambulatory Visit (INDEPENDENT_AMBULATORY_CARE_PROVIDER_SITE_OTHER): Admitting: Family Medicine

## 2024-09-17 VITALS — BP 113/74 | HR 63 | Temp 98.3°F | Ht 64.0 in | Wt 115.6 lb

## 2024-09-17 DIAGNOSIS — Z23 Encounter for immunization: Secondary | ICD-10-CM

## 2024-09-17 DIAGNOSIS — Z7185 Encounter for immunization safety counseling: Secondary | ICD-10-CM

## 2024-09-17 DIAGNOSIS — F341 Dysthymic disorder: Secondary | ICD-10-CM | POA: Diagnosis not present

## 2024-09-17 DIAGNOSIS — E871 Hypo-osmolality and hyponatremia: Secondary | ICD-10-CM

## 2024-09-17 DIAGNOSIS — M810 Age-related osteoporosis without current pathological fracture: Secondary | ICD-10-CM

## 2024-09-17 NOTE — Patient Instructions (Signed)
 Please follow up as scheduled for your next visit with me: 01/07/2025   If you have any questions or concerns, please don't hesitate to send me a message via MyChart or call the office at [phone]. Thank you for visiting with us  today! It's our pleasure caring for you.    VISIT SUMMARY: Today, we discussed your bone density, chronic chest pain, sodium levels, and vitamin B12 status. Your bone density has improved to the osteopenic range, and we talked about taking a break from Fosamax . We also reviewed your chronic chest pain, which may be related to costochondritis and possibly Fosamax . Your sodium levels are normal now, and we will continue to monitor them. Lastly, we will check your vitamin B12 levels in January.  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 We discussed that Fosamax  might be contributing to your musculoskeletal pain. We will monitor your symptoms after stopping Fosamax , and you should follow up with Doctor Claudene for further evaluation.  -OSTEOPENIA: Osteopenia is a condition where bone density is lower than normal but not low enough to be classified as osteoporosis. Your bone density has improved to the osteopenic range, and we discussed taking a 1-2 year break from Fosamax  due to long-term risks. We will recheck your bone density in 2 years. Continue your exercise and bone health activities.  -HYPONATREMIA, RESOLVED: Hyponatremia is a condition where sodium levels in the blood are lower than normal. Your sodium levels are currently normal, but we will monitor them with blood work in January. Avoid excessive water intake and do not limit dietary salt intake.  -CONSTIPATION, INTERMITTENT: Intermittent constipation is occasional difficulty in passing stools. There are no immediate concerns, but you should monitor your bowel movement patterns.  -VITAMIN  B12 STATUS: Vitamin B12 is important for nerve function and the production of red blood cells. You have not had your B12 levels checked before, but we will check them in January and consider supplementation if your levels are low.  INSTRUCTIONS: Please follow up with Doctor Claudene for further evaluation of your chronic chest pain. We will monitor your sodium levels and check your vitamin B12 levels with blood work in January. Recheck your bone density in 2 years.                      Contains text generated by [employer].                                 Contains text generated by [employer].

## 2024-09-17 NOTE — Progress Notes (Signed)
 Subjective  CC:  Chief Complaint  Patient presents with   DEXA results    Pt would like to discuss Dexa results along with some other concerns regarding Low sodium and b12     HPI: Allison [name] is a 73 y.o. female who presents to the office today to address the problems listed above in the chief complaint. Discussed the use of AI scribe software for clinical note transcription with the patient, who gave verbal consent to proceed.  History of Present Illness Allison [name] is a 73 year old female with osteoporosis who presents for evaluation of bone density and musculoskeletal pain.  Her recent bone density test was performed on a different machine, making direct comparison to prior tests difficult. She has been on Fosamax  for 6 years and her bone density is now in the osteopenic range. Additionally, she has been on tamoxifen. DEXA shows lowest T = -2.3 at L/spine and -1.7 in femur. This is improved.   She experiences chronic chest pain, initially diagnosed as costochondritis, which has worsened over the years. The pain now affects her ribcage and complicates physical manipulation. Previous investigations, including an endoscopy and a cardiac CT, were unremarkable. An x-ray of her ribs revealed calcium deposits, raising concerns about the potential role of Fosamax  in her musculoskeletal pain. She reports musculoskeletal pain in the ribcage area, which is movement-related.  She has a history of low sodium levels.  Reviewing labs, a few years ago she had some mild hyponatremia which has since resolved.  She does not add salt to her food and consumes a lot of water due to her active lifestyle. She is on sertraline , which can affect sodium balance, but her sodium levels were normal in April.  She has never been anemic and has not had her vitamin B12 levels checked. Her husband was found to have low B12 and is on supplements.   Assessment  1. Osteoporosis of lumbar spine   2.  Hyponatremia   3. Dysthymia   4. Vaccine counseling   5. 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 Discussed Fosamax  as a potential contributor to musculoskeletal pain. - Monitor symptoms after stopping Fosamax . - Follow up with Doctor Claudene for further evaluation.  Osteopenia with history of osteoporosis status post 6 years of Fosamax . Bone density improved to osteopenic range. Fosamax  and tamoxifen effective. Discussed drug holiday due to long-term Fosamax  risks. - Initiate drug holiday from Fosamax  for 1-2 years. - Recheck bone density in 2 years. - Continue exercise and bone health activities.  Hyponatremia, resolved Hyponatremia resolved. Sertraline  may have affected sodium balance. Current sodium levels normal. - Monitor sodium levels with blood work in January. - Avoid excessive water intake. - Do not limit dietary salt intake.  Constipation, intermittent Intermittent constipation not linked to low sodium. No immediate concerns. - Monitor bowel movement patterns.  Vitamin B12 status No previous B12 level checked. Discussed husband's recent deficiency. No current symptoms of deficiency. - Check B12 levels in January. - Consider supplementation if levels are low.    Follow up: January for complete physical Orders Placed This Encounter  Procedures   Flu vaccine HIGH DOSE PF(Fluzone Trivalent)   No orders of the defined types were placed in this encounter.    I reviewed the patients updated PMH, FH, and SocHx.  Patient Active Problem List   Diagnosis  Date Noted   Dysthymia 03/23/2019    Priority: High   Hx of breast cancer, left     Priority: High   Osteoporosis of lumbar spine 04/01/2020    Priority: Medium    Primary osteoarthritis of right hip 11/02/2019    Priority: Medium    Lumbar herniated disc 03/23/2019     Priority: Medium    Chronic allergic rhinitis 01/04/2023    Priority: Low   Sensorineural hearing loss (SNHL) of both ears 01/04/2022    Priority: Low   Metatarsalgia of left foot 11/02/2019    Priority: Low   Cyst of spleen 05/15/2024   Abdominal pain, chronic, right lower quadrant 03/30/2024   Abdominal pain, chronic, epigastric 04/14/2022   Nonallopathic lesion of lumbosacral region 10/16/2019   Nonallopathic lesion of sacral region 10/16/2019   Nonallopathic lesion of thoracic region 10/16/2019   Current Meds  Medication Sig   Calcium Citrate-Vitamin D  (CALCIUM + D PO) Take by mouth.   cetirizine  (ZYRTEC  ALLERGY) 10 MG tablet Take 1 tablet (10 mg total) by mouth at bedtime.   diclofenac  sodium (VOLTAREN ) 1 % GEL Apply 2 grams topically to affected area qid   fluticasone  (FLONASE ) 50 MCG/ACT nasal spray Place 1 spray into both nostrils daily.   gabapentin  (NEURONTIN ) 100 MG capsule Take 2 capsules (200 mg total) by mouth 2 (two) times daily.   MAGNESIUM PO Take by mouth.   meloxicam  (MOBIC ) 15 MG tablet Take 1 tablet (15 mg total) by mouth daily.   Multiple Vitamins-Minerals (MULTIVIT/MULTIMINERAL ADULT PO) Take by mouth.   NON FORMULARY Tart cherry extract   ondansetron  (ZOFRAN ) 4 MG tablet Take 1 tablet (4 mg total) by mouth every 8 (eight) hours as needed for nausea or vomiting.   sertraline  (ZOLOFT ) 25 MG tablet TAKE 1 TABLET DAILY   Turmeric 500 MG TABS Take by mouth.   Vitamin D , Cholecalciferol, 10 MCG (400 UNIT) TABS SMARTSIG:1 Tablet(s) By Mouth   [DISCONTINUED] alendronate  (FOSAMAX ) 70 MG tablet TAKE 1 TABLET WEEKLY ON AN EMPTY STOMACH WITH A FULL  GLASS OF WATER.   Allergies: Patient is allergic to penicillins and sulfa antibiotics. Family History: Patient family history includes Alcohol abuse in her brother, father, and mother; Cancer in her brother and mother; Depression in her father; Early death in her father. Social History:  Patient  reports that she quit  smoking about 51 years ago. Her smoking use included cigarettes. She has never used smokeless tobacco. She reports current alcohol use. She reports that she does not use drugs.  Review of Systems: Constitutional: Negative for fever malaise or anorexia Cardiovascular: negative for chest pain Respiratory: negative for SOB or persistent cough Gastrointestinal: negative for abdominal pain  Objective  Vitals: BP 113/74   Pulse 63   Temp 98.3 F (36.8 C)   Ht 5' 4 (1.626 m)   Wt 115 lb 9.6 oz (52.4 kg)   SpO2 99%   BMI 19.84 kg/m  General: no acute distress , A&Ox3  Commons side effects, risks, benefits, and alternatives for medications and treatment plan prescribed today were discussed, and the patient expressed understanding of the given instructions. Patient is instructed to call or message via MyChart if he/she has any questions or concerns regarding our treatment plan. No barriers to understanding were identified. We discussed Red Flag symptoms and signs in detail. Patient expressed understanding regarding what to do in case of urgent or emergency type symptoms.  Medication list was reconciled, printed and provided to the patient  in AVS. Patient instructions and summary information was reviewed with the patient as documented in the AVS. This note was prepared with assistance of Dragon voice recognition software. Occasional wrong-word or sound-a-like substitutions may have occurred due to the inherent limitations of voice recognition software

## 2024-09-18 ENCOUNTER — Ambulatory Visit: Admitting: Family Medicine

## 2024-09-18 NOTE — Progress Notes (Unsigned)
 Allison [name] [name] [address] [place] [address] Phone: [phone] Subjective:   LILLETTE Allison [name], am serving as a scribe for Dr. Arthea [name].  I'm seeing this patient by the request  of:  Allison Lavern CROME, MD  CC: Complaint of chest pain and abdominal pain.  [name]:Allison [name]  Allison [name] is a 74 y.o. female coming in with complaint of chest pain. Last seen for MSK on 07/24/2024. Patient states that she wants to discuss CT scan from 08/06/2024. Pain has improved. Wants to know if [name] could be contributing to her pain. PCP recommended drug break from medication.    Patient did have a CT of the chest done with contrast.  Found to have postoperative findings of the left lumpectomy and axillary lymph node dissection.  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  These are stable since 2020.   Since we have seen patient also had bone density that shows patient is osteopenic. 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 DEXA 03/2020 Osteoporosis T = -2.5 at L1-2, lowest, femurs osteopenia, on fosamax . Continue fosamax .    Personal history of chemotherapy    Personal history of radiation therapy    PONV (postoperative nausea and vomiting)    Past Surgical History:  Procedure Laterality Date   ABDOMINAL HYSTERECTOMY     BREAST BIOPSY Left 1999   BREAST LUMPECTOMY Left 2000   COLONOSCOPY     OPEN REDUCTION INTERNAL FIXATION (ORIF) METACARPAL Left 05/06/2020   Procedure: OPEN REDUCTION INTERNAL FIXATION (ORIF) LEFT THUMB METACARPAL FRACTURE;  Surgeon: Murrell Drivers, MD;  Location: [facility];  Service: Orthopedics;   Laterality: Left;  block in preop   TONSILECTOMY/ADENOIDECTOMY WITH MYRINGOTOMY     TONSILLECTOMY     Social History   Socioeconomic History   Marital status: Married    Spouse name: Not on file   Number of children: 0   Years of education: Not on file   Highest education level: Professional school degree (e.g., MD, DDS, DVM, JD)  Occupational History   Occupation: Retired     Comment: professor @ [employer]   Occupation: retired  Tobacco Use   Smoking status: Former    Current packs/day: 0.00    Types: Cigarettes    Quit date: 12/27/1972    Years since quitting: 51.7   Smokeless tobacco: Never  Vaping Use   Vaping status: Never Used  Substance and Sexual Activity   Alcohol use: Yes    Comment: wine and beer several night a week   Drug use: Never   Sexual activity: Not on file  Other Topics Concern   Not on file  Social History Narrative   Pets:  2 cats adopted at the start of Covid    Social Drivers of Health   Financial Resource Strain: Low Risk  (09/13/2024)   Overall Financial Resource Strain (CARDIA)    Difficulty of Paying Living Expenses: Not hard at all  Food Insecurity: No Food Insecurity (09/13/2024)   Hunger Vital Sign    Worried About Running Out of  Food in the Last Year: Never true    Ran Out of Food in the Last Year: Never true  Transportation Needs: No Transportation Needs (09/13/2024)   PRAPARE - Administrator, Civil Service (Medical): No    Lack of Transportation (Non-Medical): No  Physical Activity: Sufficiently Active (09/13/2024)   Exercise Vital Sign    Days of Exercise per Week: 6 days    Minutes of Exercise per Session: 90 min  Stress: No Stress Concern Present (09/13/2024)   [name]    Feeling of Stress: Not at all  Social Connections: Socially Integrated (09/13/2024)   Social Connection and Isolation Panel    Frequency of Communication with  Friends and Family: Once a week    Frequency of Social Gatherings with Friends and Family: Three times a week    Attends Religious Services: 1 to 4 times per year    Active Member of Clubs or Organizations: Yes    Attends Engineer, structural: More than 4 times per year    Marital Status: Married   Allergies  Allergen Reactions   Penicillins     Childhood allergy   Sulfa Antibiotics Hives and Swelling   Family History  Problem Relation Age of Onset   Alcohol abuse Mother    Cancer Mother    Alcohol abuse Father    Depression Father    Early death Father    Alcohol abuse Brother    Cancer Brother    Breast cancer Neg Hx    Colon cancer Neg Hx    Esophageal cancer Neg Hx    Stomach cancer Neg Hx    Rectal cancer Neg Hx       Current Outpatient Medications (Respiratory):    cetirizine  (ZYRTEC  ALLERGY) 10 MG tablet, Take 1 tablet (10 mg total) by mouth at bedtime.   fluticasone  (FLONASE ) 50 MCG/ACT nasal spray, Place 1 spray into both nostrils daily.  Current Outpatient Medications (Analgesics):    meloxicam  (MOBIC ) 15 MG tablet, Take 1 tablet (15 mg total) by mouth daily.   Current Outpatient Medications (Other):    Calcium Citrate-Vitamin D  (CALCIUM + D PO), Take by mouth.   diclofenac  sodium (VOLTAREN ) 1 % GEL, Apply 2 grams topically to affected area qid   gabapentin  (NEURONTIN ) 100 MG capsule, Take 2 capsules (200 mg total) by mouth 2 (two) times daily.   MAGNESIUM PO, Take by mouth.   Multiple Vitamins-Minerals (MULTIVIT/MULTIMINERAL ADULT PO), Take by mouth.   NON FORMULARY, Tart cherry extract   ondansetron  (ZOFRAN ) 4 MG tablet, Take 1 tablet (4 mg total) by mouth every 8 (eight) hours as needed for nausea or vomiting.   sertraline  (ZOLOFT ) 25 MG tablet, TAKE 1 TABLET DAILY   Turmeric 500 MG TABS, Take by mouth.   Vitamin D , Cholecalciferol, 10 MCG (400 UNIT) TABS, SMARTSIG:1 Tablet(s) By Mouth   Reviewed prior external information including notes and  imaging from  primary care provider As well as notes that were available from care everywhere and other healthcare systems.  Past medical history, social, surgical and family history all reviewed in electronic medical record.  No pertanent information unless stated regarding to the chief complaint.   Review of Systems:  No headache, visual changes, nausea, vomiting, diarrhea, constipation, dizziness, abdominal pain, skin rash, fevers, chills, night sweats, weight loss, swollen lymph nodes, body aches, joint swelling, chest pain, shortness of breath, mood changes. POSITIVE muscle aches  Objective  Blood  pressure 100/72, pulse 73, height 5' 4 (1.626 m), weight 114 lb (51.7 kg), SpO2 98%.   General: No apparent distress alert and oriented x3 mood and affect normal, dressed appropriately.  HEENT: Pupils equal, extraocular movements intact  Respiratory: Patient's speak in full sentences and does not appear short of breath  Cardiovascular: No lower extremity edema, non tender, no erythema   Deferred exam today.   Impression and Recommendations:    The above documentation has been reviewed and is accurate and complete Ermin Parisien M Eular Panek, DO

## 2024-09-19 ENCOUNTER — Ambulatory Visit (INDEPENDENT_AMBULATORY_CARE_PROVIDER_SITE_OTHER): Admitting: Family Medicine

## 2024-09-19 VITALS — BP 100/72 | HR 73 | Ht 64.0 in | Wt 114.0 lb

## 2024-09-19 DIAGNOSIS — M5126 Other intervertebral disc displacement, lumbar region: Secondary | ICD-10-CM | POA: Diagnosis not present

## 2024-09-19 DIAGNOSIS — M810 Age-related osteoporosis without current pathological fracture: Secondary | ICD-10-CM | POA: Diagnosis not present

## 2024-09-19 NOTE — Assessment & Plan Note (Signed)
 Patient's osteoporosis is actually improved.  At this point with patient being on Fosamax  for 5 years we did discuss the possibility of discontinuing the medication.  Patient is highly active and will continue to be so.  Patient continues to have some limited chest discomfort.  CT of the chest we discussed with pulmonary nodules that seem to be stable but did have some fibrosis.  Discussed potential Qvar.  Patient at the moment would like to monitor and see how she does with discontinuing the Fosamax .  Depending on that we will discuss further.  Follow-up with me again 6 to 8 weeks to further evaluate.

## 2024-09-19 NOTE — Assessment & Plan Note (Signed)
 Discussed with patient again at great length.  After discussion greater than 31 minutes about the CT scan and different treatment options.

## 2024-09-19 NOTE — Patient Instructions (Signed)
 Stop [name] Consider Qvar long term See me in 6 weeks

## 2024-09-27 DIAGNOSIS — Z23 Encounter for immunization: Secondary | ICD-10-CM | POA: Diagnosis not present

## 2024-10-02 DIAGNOSIS — H04123 Dry eye syndrome of bilateral lacrimal glands: Secondary | ICD-10-CM | POA: Diagnosis not present

## 2024-10-02 DIAGNOSIS — H2513 Age-related nuclear cataract, bilateral: Secondary | ICD-10-CM | POA: Diagnosis not present

## 2024-10-02 DIAGNOSIS — H524 Presbyopia: Secondary | ICD-10-CM | POA: Diagnosis not present

## 2024-10-29 NOTE — Progress Notes (Unsigned)
 [name] [address] [place] [address] Phone: [phone] Subjective:   Allison [name], am serving as a scribe for Dr. Arthea [name].  I'm seeing this patient by the request  of:  [name], MD  CC: Back pain follow-up  [name]:Dlagzrupcz  09/19/2024 Discussed with patient again at great length.  After discussion greater than 31 minutes about the CT scan and different treatment options.     Patient's osteoporosis is actually improved.  At this point with patient being on Fosamax  for 5 years we did discuss the possibility of discontinuing the medication.  Patient is highly active and will continue to be so.  Patient continues to have some limited chest discomfort.  CT of the chest we discussed with pulmonary nodules that seem to be stable but did have some fibrosis.  Discussed potential Qvar.  Patient at the moment would like to monitor and see how she does with discontinuing the Fosamax .  Depending on that we will discuss further.  Follow-up with me again 6 to 8 weeks to further evaluate     Update 10/30/2024 Allison [name] is a 73 y.o. female coming in with complaint of lumbar spine pain. Patient states refill of gabapentin . Here for MSK. Back has been stiff the last couple of days. Hasn't much chest problems has gotten off fosamax .  CT chest 08/06/2024 IMPRESSION: 1. No acute CT findings of the chest to explain chest pain. 2. Postoperative findings of left lumpectomy and axillary lymph node dissection. No evidence of lymphadenopathy or metastatic disease in the chest. 3. Occasional tiny pulmonary nodules, measuring up to 0.3 cm. Some of these were included in the field of view on examination dated 10/01/2019 and unchanged, definitively benign. Others not previously imaged although given tiny size and scattered distribution likewise very likely benign despite personal history of breast malignancy. No follow-up needed if patient is low-risk  (and has no known or suspected primary neoplasm). Non-contrast chest CT can be considered in 12 months if patient is high-risk.     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 DEXA 03/2020 Osteoporosis T = -2.5 at L1-2, lowest, femurs osteopenia, on fosamax . Continue fosamax .    Personal history of chemotherapy    Personal history of radiation therapy    PONV (postoperative nausea and vomiting)    Past Surgical History:  Procedure Laterality Date   ABDOMINAL HYSTERECTOMY     BREAST BIOPSY Left 1999   BREAST LUMPECTOMY Left 2000   COLONOSCOPY     OPEN REDUCTION INTERNAL FIXATION (ORIF) METACARPAL Left 05/06/2020   Procedure: OPEN REDUCTION INTERNAL FIXATION (ORIF) LEFT THUMB METACARPAL FRACTURE;  Surgeon: Murrell Drivers, MD;  Location: [facility];  Service: Orthopedics;  Laterality: Left;  block in preop   TONSILECTOMY/ADENOIDECTOMY WITH MYRINGOTOMY     TONSILLECTOMY     Social History   Socioeconomic History   Marital status: Married    Spouse name: Not on file   Number of children: 0   Years of education: Not on file   Highest education level: Professional school degree (e.g., MD, DDS, DVM, JD)  Occupational  History   Occupation: Retired     Comment: professor engineer, mining   Occupation: retired  Tobacco Use   Smoking status: Former    Current packs/day: 0.00    Types: Cigarettes    Quit date: 12/27/1972    Years since quitting: 51.8   Smokeless tobacco: Never  Vaping Use   Vaping status: Never Used  Substance and Sexual Activity   Alcohol use: Yes    Comment: wine and beer several night a week   Drug use: Never   Sexual activity: Not on file  Other Topics Concern   Not on  file  Social History Narrative   Pets:  2 cats adopted at the start of Covid    Social Drivers of Health   Financial Resource Strain: Low Risk  (09/13/2024)   Overall Financial Resource Strain (CARDIA)    Difficulty of Paying Living Expenses: Not hard at all  Food Insecurity: No Food Insecurity (09/13/2024)   Hunger Vital Sign    Worried About Running Out of Food in the Last Year: Never true    Ran Out of Food in the Last Year: Never true  Transportation Needs: No Transportation Needs (09/13/2024)   PRAPARE - Administrator, Civil Service (Medical): No    Lack of Transportation (Non-Medical): No  Physical Activity: Sufficiently Active (09/13/2024)   Exercise Vital Sign    Days of Exercise per Week: 6 days    Minutes of Exercise per Session: 90 min  Stress: No Stress Concern Present (09/13/2024)   [name]    Feeling of Stress: Not at all  Social Connections: Socially Integrated (09/13/2024)   Social Connection and Isolation Panel    Frequency of Communication with Friends and Family: Once a week    Frequency of Social Gatherings with Friends and Family: Three times a week    Attends Religious Services: 1 to 4 times per year    Active Member of Clubs or Organizations: Yes    Attends Engineer, Structural: More than 4 times per year    Marital Status: Married   Allergies  Allergen Reactions   Penicillins     Childhood allergy   Sulfa Antibiotics Hives and Swelling   Family History  Problem Relation Age of Onset   Alcohol abuse Mother    Cancer Mother    Alcohol abuse Father    Depression Father    Early death Father    Alcohol abuse Brother    Cancer Brother    Breast cancer Neg Hx    Colon cancer Neg Hx    Esophageal cancer Neg Hx    Stomach cancer Neg Hx    Rectal cancer Neg Hx       Current Outpatient Medications (Respiratory):    cetirizine  (ZYRTEC  ALLERGY) 10 MG tablet, Take 1  tablet (10 mg total) by mouth at bedtime.   fluticasone  (FLONASE ) 50 MCG/ACT nasal spray, Place 1 spray into both nostrils daily.  Current Outpatient Medications (Analgesics):    meloxicam  (MOBIC ) 15 MG tablet, Take 1 tablet (15 mg total) by mouth daily.   Current Outpatient Medications (Other):    Calcium Citrate-Vitamin D  (CALCIUM + D PO), Take by mouth.   diclofenac  sodium (VOLTAREN ) 1 % GEL, Apply 2 grams topically to affected area qid   gabapentin  (NEURONTIN ) 100 MG capsule, Take 2 capsules (200 mg total) by mouth 2 (two) times daily.   MAGNESIUM PO, Take by  mouth.   Multiple Vitamins-Minerals (MULTIVIT/MULTIMINERAL ADULT PO), Take by mouth.   NON FORMULARY, Tart cherry extract   ondansetron  (ZOFRAN ) 4 MG tablet, Take 1 tablet (4 mg total) by mouth every 8 (eight) hours as needed for nausea or vomiting.   sertraline  (ZOLOFT ) 25 MG tablet, TAKE 1 TABLET DAILY   Turmeric 500 MG TABS, Take by mouth.   Vitamin D , Cholecalciferol, 10 MCG (400 UNIT) TABS, SMARTSIG:1 Tablet(s) By Mouth   Reviewed prior external information including notes and imaging from  primary care provider As well as notes that were available from care everywhere and other healthcare systems.  Past medical history, social, surgical and family history all reviewed in electronic medical record.  No pertanent information unless stated regarding to the chief complaint.   Review of Systems:  No headache, visual changes, nausea, vomiting, diarrhea, constipation, dizziness, abdominal pain, skin rash, fevers, chills, night sweats, weight loss, swollen lymph nodes, body aches, joint swelling, chest pain, shortness of breath, mood changes. POSITIVE muscle aches  Objective  Blood pressure 110/80, pulse 60, height 5' 4 (1.626 m), weight 121 lb (54.9 kg), SpO2 99%.   General: No apparent distress alert and oriented x3 mood and affect normal, dressed appropriately.  HEENT: Pupils equal, extraocular movements intact   Respiratory: Patient's speak in full sentences and does not appear short of breath  Cardiovascular: No lower extremity edema, non tender, no erythema  Low back shows tightness noted mostly in the thoracolumbar juncture.  Patient does have tightness still in the paraspinal musculature right greater than left.   Osteopathic findings C2 flexed rotated and side bent right C4 flexed rotated and side bent left T3 extended rotated and side bent right inhaled third rib T7 extended rotated and side bent left L1 flexed rotated and side bent left  Sacrum right on right    Impression and Recommendations:    Osteoporosis of lumbar spine Doing well, stopped the fosamax , recheck Dexa in 2 years increase activity slowly.  Discussed icing regimen and home exercises, discussed if worsening symptoms would have to consider the possibility of Prolia.  Patient is happy with the idea.  Follow-up with me again in 2 to 3 months could even recheck in 1 year with DEXA if concern.    Decision today to treat with OMT was based on Physical Exam  After verbal consent patient was treated with ME, FPR techniques in cervical, thoracic, rib, lumbar and sacral areas, all areas are chronic   Patient tolerated the procedure well with improvement in symptoms  Patient given exercises, stretches and lifestyle modifications  See medications in patient instructions if given  Patient will follow up in 4-8 weeks The above documentation has been reviewed and is accurate and complete Channell Quattrone M Jatoya Armbrister, DO

## 2024-10-30 ENCOUNTER — Encounter: Payer: Self-pay | Admitting: Family Medicine

## 2024-10-30 ENCOUNTER — Ambulatory Visit: Admitting: Family Medicine

## 2024-10-30 VITALS — BP 110/80 | HR 60 | Ht 64.0 in | Wt 121.0 lb

## 2024-10-30 DIAGNOSIS — M9902 Segmental and somatic dysfunction of thoracic region: Secondary | ICD-10-CM

## 2024-10-30 DIAGNOSIS — M810 Age-related osteoporosis without current pathological fracture: Secondary | ICD-10-CM | POA: Diagnosis not present

## 2024-10-30 DIAGNOSIS — M9901 Segmental and somatic dysfunction of cervical region: Secondary | ICD-10-CM

## 2024-10-30 DIAGNOSIS — M9904 Segmental and somatic dysfunction of sacral region: Secondary | ICD-10-CM | POA: Diagnosis not present

## 2024-10-30 DIAGNOSIS — M9908 Segmental and somatic dysfunction of rib cage: Secondary | ICD-10-CM | POA: Diagnosis not present

## 2024-10-30 DIAGNOSIS — M9903 Segmental and somatic dysfunction of lumbar region: Secondary | ICD-10-CM | POA: Diagnosis not present

## 2024-10-30 MED ORDER — GABAPENTIN 100 MG PO CAPS: 200.0000 mg | ORAL_CAPSULE | Freq: Two times a day (BID) | ORAL | 0 refills | Status: DC

## 2024-10-30 NOTE — Patient Instructions (Addendum)
 Good to see you! Refilled Gabapentin  Stay as active as you can be See you again in 2-3 months

## 2024-10-30 NOTE — Assessment & Plan Note (Signed)
 Doing well, stopped the fosamax , recheck Dexa in 2 years increase activity slowly.  Discussed icing regimen and home exercises, discussed if worsening symptoms would have to consider the possibility of Prolia.  Patient is happy with the idea.  Follow-up with me again in 2 to 3 months could even recheck in 1 year with DEXA if concern.

## 2024-11-05 ENCOUNTER — Emergency Department (HOSPITAL_COMMUNITY)

## 2024-11-05 ENCOUNTER — Encounter (HOSPITAL_COMMUNITY): Payer: Self-pay | Admitting: Emergency Medicine

## 2024-11-05 ENCOUNTER — Other Ambulatory Visit: Payer: Self-pay

## 2024-11-05 ENCOUNTER — Emergency Department (HOSPITAL_COMMUNITY)
Admission: EM | Admit: 2024-11-05 | Discharge: 2024-11-06 | Disposition: A | Discharge status: Home / Self Care | Attending: Emergency Medicine | Admitting: Emergency Medicine

## 2024-11-05 DIAGNOSIS — R1013 Epigastric pain: Secondary | ICD-10-CM | POA: Insufficient documentation

## 2024-11-05 DIAGNOSIS — R0789 Other chest pain: Secondary | ICD-10-CM | POA: Diagnosis not present

## 2024-11-05 DIAGNOSIS — Z853 Personal history of malignant neoplasm of breast: Secondary | ICD-10-CM | POA: Insufficient documentation

## 2024-11-05 DIAGNOSIS — I4430 Unspecified atrioventricular block: Secondary | ICD-10-CM | POA: Diagnosis not present

## 2024-11-05 DIAGNOSIS — R079 Chest pain, unspecified: Secondary | ICD-10-CM | POA: Diagnosis not present

## 2024-11-05 LAB — CBC
HCT: 38 % (ref 36.0–46.0)
Hemoglobin: 12.6 g/dL (ref 12.0–15.0)
MCH: 31.3 pg (ref 26.0–34.0)
MCHC: 33.2 g/dL (ref 30.0–36.0)
MCV: 94.3 fL (ref 80.0–100.0)
Platelets: 291 K/uL (ref 150–400)
RBC: 4.03 MIL/uL (ref 3.87–5.11)
RDW: 13.2 % (ref 11.5–15.5)
WBC: 7.9 K/uL (ref 4.0–10.5)
nRBC: 0 % (ref 0.0–0.2)

## 2024-11-05 LAB — BASIC METABOLIC PANEL WITH GFR
Anion gap: 15 (ref 5–15)
BUN: 9 mg/dL (ref 8–23)
CO2: 24 mmol/L (ref 22–32)
Calcium: 9.1 mg/dL (ref 8.9–10.3)
Chloride: 100 mmol/L (ref 98–111)
Creatinine, Ser: 0.86 mg/dL (ref 0.44–1.00)
GFR, Estimated: >60 mL/min (ref >60)
Glucose, Bld: 99 mg/dL (ref 70–99)
Potassium: 3.9 mmol/L (ref 3.5–5.1)
Sodium: 139 mmol/L (ref 135–145)

## 2024-11-05 LAB — TROPONIN I (HIGH SENSITIVITY): Troponin I (High Sensitivity): 5 ng/L (ref <18)

## 2024-11-05 NOTE — ED Triage Notes (Addendum)
 Pt BIB EMS from [facility] with c/o chest pain that radiates to her back and around her right ribs x 2-3 days. Hx costochondritis, states that it feels the same.   0.4 nitroglycerin 324 ASA   20RAC

## 2024-11-05 NOTE — ED Provider Triage Note (Signed)
 Emergency Medicine Provider Triage Evaluation Note  Allison [name] , a 73 y.o. female  was evaluated in triage.  Pt complains of cp. Started about a day ago.  Worse with movement, deep breathing.  Denies any coughing.  No history of PE or DVT.  No lower extremity edema, pain, recent falls or injuries.  States she has had pain like this previously with unremarkable workup.  Review of Systems  Positive: CP Negative:   Physical Exam  BP (!) 149/86 (BP Location: Right Arm)   Pulse (!) 105   Temp 98 F (36.7 C)   Resp 16   SpO2 100%  Gen:   Awake, no distress   Resp:  Normal effort  MSK:   Moves extremities without difficulty  Other:    Medical Decision Making  Medically screening exam initiated at 9:07 PM.  Appropriate orders placed.  Jaisa Defino was informed that the remainder of the evaluation will be completed by another provider, this initial triage assessment does not replace that evaluation, and the importance of remaining in the ED until their evaluation is complete.  CP   Noralee Dutko A, PA-C 11/05/24 2109

## 2024-11-06 ENCOUNTER — Encounter: Payer: Self-pay | Admitting: Family Medicine

## 2024-11-06 ENCOUNTER — Emergency Department (HOSPITAL_COMMUNITY)

## 2024-11-06 DIAGNOSIS — R0789 Other chest pain: Secondary | ICD-10-CM | POA: Diagnosis not present

## 2024-11-06 DIAGNOSIS — Z0389 Encounter for observation for other suspected diseases and conditions ruled out: Secondary | ICD-10-CM | POA: Diagnosis not present

## 2024-11-06 DIAGNOSIS — N3289 Other specified disorders of bladder: Secondary | ICD-10-CM | POA: Diagnosis not present

## 2024-11-06 LAB — HEPATIC FUNCTION PANEL
ALT: 12 U/L (ref 0–44)
AST: 19 U/L (ref 15–41)
Albumin: 3.2 g/dL — ABNORMAL LOW (ref 3.5–5.0)
Alkaline Phosphatase: 33 U/L — ABNORMAL LOW (ref 38–126)
Bilirubin, Direct: 0.1 mg/dL (ref 0.0–0.2)
Indirect Bilirubin: 0.5 mg/dL (ref 0.3–0.9)
Total Bilirubin: 0.6 mg/dL (ref 0.0–1.2)
Total Protein: 5.7 g/dL — ABNORMAL LOW (ref 6.5–8.1)

## 2024-11-06 LAB — TROPONIN I (HIGH SENSITIVITY): Troponin I (High Sensitivity): 6 ng/L (ref <18)

## 2024-11-06 LAB — LIPASE, BLOOD: Lipase: 29 U/L (ref 11–51)

## 2024-11-06 MED ORDER — SODIUM CHLORIDE 0.9 % IV BOLUS
500.0000 mL | Freq: Once | INTRAVENOUS | Status: AC
  Administered 2024-11-06: 500 mL via INTRAVENOUS

## 2024-11-06 MED ORDER — IOHEXOL 350 MG/ML SOLN
75.0000 mL | Freq: Once | INTRAVENOUS | Status: AC | PRN
  Administered 2024-11-06: 75 mL via INTRAVENOUS

## 2024-11-06 MED ORDER — ALUM & MAG HYDROXIDE-SIMETH 200-200-20 MG/5ML PO SUSP
30.0000 mL | Freq: Once | ORAL | Status: AC
  Administered 2024-11-06: 30 mL via ORAL
  Filled 2024-11-06: qty 30

## 2024-11-06 MED ORDER — LIDOCAINE VISCOUS HCL 2 % MT SOLN
15.0000 mL | Freq: Once | OROMUCOSAL | Status: AC
  Administered 2024-11-06: 15 mL via ORAL
  Filled 2024-11-06: qty 15

## 2024-11-06 MED ORDER — ONDANSETRON HCL 4 MG/2ML IJ SOLN
4.0000 mg | Freq: Once | INTRAMUSCULAR | Status: DC
  Filled 2024-11-06: qty 2

## 2024-11-06 MED ORDER — MORPHINE SULFATE (PF) 2 MG/ML IV SOLN
2.0000 mg | Freq: Once | INTRAVENOUS | Status: DC
  Filled 2024-11-06: qty 1

## 2024-11-06 NOTE — ED Provider Notes (Signed)
 [place] EM[facility]GENC[facility] DEPARTMENT AT [facility] Provider Note   CSN: [id number] Arrival date & time: 11/05/24  2020     Patient presents with: Chest Pain   Allison [name] is a 73 y.o. female.   73 year old female with complaint of epigastric/mid sternal chest pain onset earlier in the day yesterday (Monday). Pain described as intermittent, sharp- like a sword stabbing through to her back, with shallow breathing/worse with taking a deep breath, worse with sitting upright. Also radiates around chest bilaterally. Without fevers, chills, nausea, vomiting, diaphoresis. History of similar pain previously but not this severe.        Prior to Admission medications   Medication Sig Start Date End Date Taking? Authorizing Provider  Calcium Citrate-Vitamin D  (CALCIUM + D PO) Take by mouth.    [provider]  cetirizine  (ZYRTEC  ALLERGY) 10 MG tablet Take 1 tablet (10 mg total) by mouth at bedtime. 12/16/22   Raspet, Erin K, PA-C  diclofenac  sodium (VOLTAREN ) 1 % GEL Apply 2 grams topically to affected area qid 03/30/19   Rigby, Michael D, DO  fluticasone  (FLONASE ) 50 MCG/ACT nasal spray Place 1 spray into both nostrils daily. 12/16/22   Raspet, Erin K, PA-C  gabapentin  (NEURONTIN ) 100 MG capsule Take 2 capsules (200 mg total) by mouth 2 (two) times daily. 10/30/24   Smith, Zachary M, DO  MAGNESIUM PO Take by mouth.    [provider]  meloxicam  (MOBIC ) 15 MG tablet Take 1 tablet (15 mg total) by mouth daily. 05/19/22   Smith, Zachary M, DO  Multiple Vitamins-Minerals (MULTIVIT/MULTIMINERAL ADULT PO) Take by mouth.    [provider]  NON FORMULARY Tart cherry extract    [provider]  ondansetron  (ZOFRAN ) 4 MG tablet Take 1 tablet (4 mg total) by mouth every 8 (eight) hours as needed for nausea or vomiting. 06/10/22   Luke Lacks R, DO  sertraline  (ZOLOFT ) 25 MG tablet TAKE 1 TABLET DAILY 03/07/24   [name], MD  Turmeric 500 MG TABS Take  by mouth.    [provider]  Vitamin D , Cholecalciferol, 10 MCG (400 UNIT) TABS SMARTSIG:1 Tablet(s) By Mouth 04/27/22   [provider]    Allergies: Penicillins and Sulfa antibiotics    Review of Systems Negative except as per HPI Updated Vital Signs BP (!) 164/89 (BP Location: Right Arm)   Pulse 62   Temp 98 F (36.7 C)   Resp 17   SpO2 100%   Physical Exam Vitals and nursing note reviewed.  Constitutional:      General: She is not in acute distress.    Appearance: She is well-developed. She is not diaphoretic.  HENT:     Head: Normocephalic and atraumatic.  Cardiovascular:     Rate and Rhythm: Normal rate and regular rhythm.     Pulses: Normal pulses.     Heart sounds: Normal heart sounds.  Pulmonary:     Effort: Pulmonary effort is normal.     Breath sounds: Normal breath sounds.  Chest:     Chest wall: No tenderness.  Abdominal:     Palpations: Abdomen is soft.     Tenderness: There is abdominal tenderness in the epigastric area. There is guarding.  Musculoskeletal:     Cervical back: Neck supple.     Right lower leg: No tenderness. No edema.     Left lower leg: No tenderness. No edema.  Skin:    General: Skin is warm and dry.  Findings: No erythema or rash.  Neurological:     Mental Status: She is alert and oriented to person, place, and time.  Psychiatric:        Behavior: Behavior normal.     (all labs ordered are listed, but only abnormal results are displayed) Labs Reviewed  HEPATIC FUNCTION PANEL - Abnormal; Notable for the following components:      Result Value   Total Protein 5.7 (*)    Albumin 3.2 (*)    Alkaline Phosphatase 33 (*)    All other components within normal limits  CBC  BASIC METABOLIC PANEL WITH GFR  LIPASE, BLOOD  TROPONIN I (HIGH SENSITIVITY)  TROPONIN I (HIGH SENSITIVITY)    EKG: EKG Interpretation Date/Time:  Monday November 05 2024 20:31:02 EST Ventricular Rate:  54 PR Interval:  222 QRS  Duration:  88 QT Interval:  460 QTC Calculation: 436 R Axis:   -32  Text Interpretation: Sinus bradycardia with 1st degree A-V block Left axis deviation Confirmed by Melvenia Motto ([id number]) on 11/06/2024 12:45:59 AM  Radiology: CT Angio Chest/Abd/Pel for Dissection W and/or W/WO Result Date: 11/06/2024 EXAM: CTA CHEST, ABDOMEN AND PELVIS WITHOUT AND WITH CONTRAST 11/06/2024 02:04:14 AM TECHNIQUE: CTA of the chest was performed without and with the administration of intravenous contrast. CTA of the abdomen and pelvis was performed without and with the administration of intravenous contrast. Multiplanar reformatted images are provided for review. MIP images are provided for review. Automated exposure control, iterative reconstruction, and/or weight based adjustment of the mA/kV was utilized to reduce the radiation dose to as low as reasonably achievable. 75 mL (iohexol (OMNIPAQUE) 350 MG/ML injection 75 mL IOHEXOL 350 MG/ML SOLN) was administered. COMPARISON: Comparison study 08/06/2024. CLINICAL HISTORY: Acute aortic syndrome (AAS) suspected. FINDINGS: VASCULATURE: AORTA: Contrast images show no hyperdense crescent to suggest acute aortic injury. Postcontrast images show no aneurysmal dilatation or dissection of the aorta. The abdominal aorta is within normal limits. PULMONARY ARTERIES: The pulmonary artery shows no large central pulmonary embolism. GREAT VESSELS OF AORTIC ARCH: No acute finding. No dissection. No arterial occlusion or significant stenosis. CELIAC TRUNK: The calcified celiac axis is within normal limits. SUPERIOR MESENTERIC ARTERY: The superior mesenteric artery is within normal limits. INFERIOR MESENTERIC ARTERY: No occlusion or significant stenosis. RENAL ARTERIES: Single renal arteries are identified bilaterally. No occlusion or significant stenosis. ILIAC ARTERIES: Iliacs are tortuous but widely patent. No occlusion or significant stenosis. CHEST: MEDIASTINUM: No hilar or mediastinal  adenopathy is noted. The heart is not enlarged in size. The pericardium demonstrates no acute abnormality. The esophagus as visualized is within normal limits. LUNGS AND PLEURA: Prominent lungs are well aerated bilaterally. Scattered small nodular densities are seen bilaterally measuring less than 5 mm. No sizable parenchymal nodule is noted. No focal consolidation or pulmonary edema. No evidence of pleural effusion or pneumothorax. THORACIC BONES AND SOFT TISSUES: Bony structures are within normal limits. No acute soft tissue abnormality. ABDOMEN AND PELVIS: LIVER: Liver is within normal limits. GALLBLADDER AND BILE DUCTS: Gallbladder is within normal limits. No biliary ductal dilatation. SPLEEN: The spleen is unremarkable. PANCREAS: The pancreas is unremarkable. ADRENAL GLANDS: Adrenal glands are within normal limits. KIDNEYS, URETERS AND BLADDER: Kidneys demonstrate a normal enhancement pattern. An exophytic cyst is noted in the left kidney, stable from the prior exam. No follow-up is recommended. No stones in the kidneys or ureters. The bladder is well distended. GI AND BOWEL: Stomach and small bowel are within normal limits. No obstructive or inflammatory changes of the  colon are noted. Scattered fecal material is noted throughout the colon. No appendiceal abnormality is noted. There is no bowel obstruction. No abnormal bowel wall thickening or distension. REPRODUCTIVE: The uterus has been surgically removed. PERITONEUM AND RETROPERITONEUM: No ascites or free air. LYMPH NODES: No lymphadenopathy is noted. ABDOMINAL BONES AND SOFT TISSUES: No acute bony abnormality is noted. IMPRESSION: 1. No evidence of acute aortic abnormality 2. No evidence of pulmonary embolism. 3. Multiple bilateral solid pulmonary nodules less than 6 mm; no routine follow-up imaging is recommended per Fleischner Society Guidelines for incidental nodules in the absence of high-risk features. Electronically signed by: Oneil Devonshire MD  11/06/2024 02:15 AM EST RP Workstation: [id number]   DG Chest Port 1 View Result Date: 11/05/2024 EXAM: 1 VIEW XRAY OF THE CHEST 11/05/2024 08:39:00 PM COMPARISON: Comparison study 04/14/2022. CLINICAL HISTORY: Chest pain. FINDINGS: LUNGS AND PLEURA: The lungs are well aerated bilaterally. No focal infiltrate is seen. No pleural effusion. HEART AND MEDIASTINUM: Cardiac shadow is within normal limits. BONES AND SOFT TISSUES: Postsurgical changes in the left axilla are again noted. No acute osseous abnormality. IMPRESSION: 1. No acute cardiopulmonary pathology. Electronically signed by: Oneil Devonshire MD 11/05/2024 08:44 PM EST RP Workstation: [id number]     Procedures   Medications Ordered in the ED  ondansetron  (ZOFRAN ) injection 4 mg (4 mg Intravenous Patient Refused/Not Given 11/06/24 0221)  morphine (PF) 2 MG/ML injection 2 mg (2 mg Intravenous Patient Refused/Not Given 11/06/24 0221)  alum & mag hydroxide-simeth (MAALOX/MYLANTA) 200-200-20 MG/5ML suspension 30 mL (has no administration in time range)    And  lidocaine  (XYLOCAINE ) 2 % viscous mouth solution 15 mL (has no administration in time range)  sodium chloride  0.9 % bolus 500 mL (500 mLs Intravenous New Bag/Given 11/06/24 0208)  iohexol (OMNIPAQUE) 350 MG/ML injection 75 mL (75 mLs Intravenous Contrast Given 11/06/24 0204)                                    Medical Decision Making Amount and/or Complexity of Data Reviewed Labs: ordered. Radiology: ordered.  Risk OTC drugs. Prescription drug management.   This patient presents to the ED for concern of epigastric pain, this involves an extensive number of treatment options, and is a complaint that carries with it a high risk of complications and morbidity.  The differential diagnosis includes but not limited to ACS, dissection, pancreatitis, cholelithiasis, gastritis, peptic ulcer disease, PE   Co morbidities / Chronic conditions that complicate the patient  evaluation  Remote history of breast cancer, osteopenia   Additional history obtained:  Additional history obtained from EMR External records from outside source obtained and reviewed including prior labs and imaging on file   Lab Tests:  I Ordered, and personally interpreted labs.  The pertinent results include: Troponins negative x 2.  CBC 1.  BMP normal add on hepatic function without significant finding.  Lipase normal.   Imaging Studies ordered:  I ordered imaging studies including chest x-ray, CT chest, abdomen, pelvis I independently visualized and interpreted imaging which showed negative for acute process I agree with the radiologist interpretation   Cardiac Monitoring: / EKG:  The patient was maintained on a cardiac monitor.  I personally viewed and interpreted the cardiac monitored which showed an underlying rhythm of: sinus brady, rate 54   Problem List / ED Course / Critical interventions / Medication management  73 year old female presents with epigastric/lower midsternal chest pain, intermittent  throughout the day, worse with taking a deep breath, radiates around bilaterally.  As well as through to back.  Workup largely reassuring including imaging and labs.  Patient to follow-up with PCP, return to ER as needed. I ordered medication including GI cocktail, morphine, zofran    Reevaluation of the patient after these medicines showed that the patient declined morphine- pain isn't that bad, offered GI cocktail, patient agreeable with trial  I have reviewed the patients home medicines and have made adjustments as needed   Consultations Obtained:  I requested consultation with the ER attending, Dr. Melvenia,  and discussed lab and imaging findings as well as pertinent plan - they recommend: agrees with plan of care   Social Determinants of Health:  Has PCP, lives with family   Test / Admission - Considered:  Stable for discharge      Final diagnoses:   Atypical chest pain    ED Discharge Orders     None          Beverley Leita LABOR, PA-C 11/06/24 0316    Melvenia Motto, MD 11/06/24 0530

## 2024-11-09 ENCOUNTER — Ambulatory Visit (INDEPENDENT_AMBULATORY_CARE_PROVIDER_SITE_OTHER): Admitting: Family Medicine

## 2024-11-09 ENCOUNTER — Encounter: Payer: Self-pay | Admitting: Family Medicine

## 2024-11-09 VITALS — BP 132/76 | HR 62 | Temp 97.7°F | Ht 64.0 in | Wt 118.4 lb

## 2024-11-09 DIAGNOSIS — M5135 Other intervertebral disc degeneration, thoracolumbar region: Secondary | ICD-10-CM | POA: Diagnosis not present

## 2024-11-09 DIAGNOSIS — Z853 Personal history of malignant neoplasm of breast: Secondary | ICD-10-CM

## 2024-11-09 DIAGNOSIS — M792 Neuralgia and neuritis, unspecified: Secondary | ICD-10-CM

## 2024-11-09 DIAGNOSIS — R0789 Other chest pain: Secondary | ICD-10-CM | POA: Diagnosis not present

## 2024-11-09 DIAGNOSIS — R1013 Epigastric pain: Secondary | ICD-10-CM

## 2024-11-09 MED ORDER — GABAPENTIN 300 MG PO CAPS
300.0000 mg | ORAL_CAPSULE | Freq: Two times a day (BID) | ORAL | 3 refills | Status: AC
Start: 2024-11-09 — End: ?

## 2024-11-09 NOTE — Progress Notes (Signed)
 Subjective  CC:  Chief Complaint  Patient presents with   Hospitalization Follow-up    Atypical chest pain. Pt stated that she is still having some discomfort in her chest and some compression around her ribs. EKG was done at the hospital along with a CT    HPI: Allison [name] is a 73 y.o. female who presents to the office today to address the problems listed above in the chief complaint. Discussed the use of AI scribe software for clinical note transcription with the patient, who gave verbal consent to proceed.  History of Present Illness Allison [name] is a 73 year old female who presents with chest pain. She is accompanied by her husband, Dale.  Chest pain - Onset Monday, with severe stabbing pain radiating from the chest to the back - Pain described as 'like somebody was sticking a sword', worst pain ever experienced - Accompanied by tingling sensation - Episodes have occurred over the past five years, progressively worsening - Pain began to subside spontaneously prior to hospital visit - No relief from GI cocktail administered in the emergency room - No associated reflux, heartburn, or lower abdominal pain - reviewed ED eval: thorough r/o for angina/MI. Neg chest CT: no PE or Aortic aneurysm. EKG and labs all reassuring. Pain subsided over time.  - no h/o gastritis/gerd. Has had thorough GI workup in last year with barium swallow, EGD and abd/pelvic CT - has had lumbar MRI showing djd. Thoracic spine xrays with mildDJD, scoliosis and spurring - no h/o compression fractures - neg coronary calcium screen in 2020 with score of 0 - no dm, htn, obesity nor smoking H/o breast ca left w/ h/o radiation therapy.  Back pain and neurological sensations - Sensation of tightness and tingling in the back - Gabapentin  200 mg nightly for back pain and chest pain, ongoing for a couple of years, has been helpful  Costochondritis - Diagnosed 15 years ago - Managed by sleeping  elevated - Episodes monitored by sports medicine physician - Initial suspicion of acid reflux, but no typical reflux symptoms  Musculoskeletal triggers and activity - Pain triggered by certain postures and activities, including sitting in specific positions, bending forward, pottery, and painting classes - Regular participation in yoga and swimming, which are beneficial  Dietary and lifestyle factors - Diet excludes spicy and fried foods - Consumes coffee and chocolate - Does not add salt to food - Martini consumed at lunch on the day pain worsened  Medication history - Discontinued Fosamax  in August due to concerns about musculoskeletal side effects - No stomach problems or acid reflux symptoms while on Fosamax   Vital signs during acute episode - Elevated blood pressure during acute pain episode, attributed to pain   Assessment  1. Chest pain, atypical   2. Midepigastric pain   3. Neuropathic pain   4. DJD (degenerative joint disease), thoracolumbar   5. 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 Pain is episodic, severe, and associated with tingling and tightness. Differential includes musculoskeletal pain and neuropathic pain. Previous workup including GI evaluation was unremarkable. Gabapentin  has been used with some benefit. Current episode was severe, requiring ER visit, but no cardiac etiology was identified. - Increased gabapentin  to 300 mg twice daily, with option to titrate up to 600 mg daily as  tolerated. - Consider Prilosec for 4 weeks if needed, though not expected to be effective. - Encouraged posture correction and back exercises to support spine health. - Will consider referral to spine specialist if pain persists or worsens. - doubt cardiac related - doubt GI  related  Chronic costochondritis Intermittent chest pain consistent with chronic costochondritis, exacerbated by certain postures and activities. Pain is not typical of cardiac origin and has been present for years. Previous evaluations have not identified a cardiac cause. - Continue current management with posture correction and activity modification.  Osteopenia Previously managed with Fosamax , which was discontinued due to potential musculoskeletal side effects.  I personally spent a total of 48 minutes in the care of the patient today including preparing to see the patient, getting/reviewing separately obtained history, performing a medically appropriate exam/evaluation, counseling and educating, placing orders, documenting clinical information in the EHR, independently interpreting results, and communicating results.   Follow up: prn No orders of the defined types were placed in this encounter.  Meds ordered this encounter  Medications   gabapentin  (NEURONTIN ) 300 MG capsule    Sig: Take 1 capsule (300 mg total) by mouth 2 (two) times daily.    Dispense:  60 capsule    Refill:  3     I reviewed the patients updated PMH, FH, and SocHx.  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    Priority: Medium    Lumbar herniated disc 03/23/2019    Priority: Medium    Chronic allergic rhinitis 01/04/2023    Priority: Low   Sensorineural hearing loss (SNHL) of both ears 01/04/2022    Priority: Low   Metatarsalgia of left foot 11/02/2019    Priority: Low   Cyst of spleen 05/15/2024   Abdominal pain, chronic, right lower quadrant 03/30/2024   Abdominal pain, chronic, epigastric 04/14/2022   Nonallopathic lesion of lumbosacral region 10/16/2019   Nonallopathic lesion of sacral  region 10/16/2019   Nonallopathic lesion of thoracic region 10/16/2019   Current Meds  Medication Sig   Calcium Citrate-Vitamin D  (CALCIUM + D PO) Take by mouth.   cetirizine  (ZYRTEC  ALLERGY) 10 MG tablet Take 1 tablet (10 mg total) by mouth at bedtime.   diclofenac  sodium (VOLTAREN ) 1 % GEL Apply 2 grams topically to affected area qid   fluticasone  (FLONASE ) 50 MCG/ACT nasal spray Place 1 spray into both nostrils daily.   gabapentin  (NEURONTIN ) 300 MG capsule Take 1 capsule (300 mg total) by mouth 2 (two) times daily.   MAGNESIUM PO Take by mouth.   Multiple Vitamins-Minerals (MULTIVIT/MULTIMINERAL ADULT PO) Take by mouth.   NON FORMULARY Tart cherry extract   ondansetron  (ZOFRAN ) 4 MG tablet Take 1 tablet (4 mg total) by mouth every 8 (eight) hours as needed for nausea or vomiting.   sertraline  (ZOLOFT ) 25 MG tablet TAKE 1 TABLET DAILY   Turmeric 500 MG TABS Take by mouth.   Vitamin D , Cholecalciferol, 10 MCG (400 UNIT) TABS SMARTSIG:1 Tablet(s) By Mouth   [DISCONTINUED] gabapentin  (NEURONTIN ) 100 MG capsule Take 2 capsules (200 mg total) by mouth 2 (two) times daily.   Allergies: Patient is allergic to penicillins and sulfa antibiotics. Family History: Patient family history includes Alcohol abuse in her brother, father, and mother;  Cancer in her brother and mother; Depression in her father; Early death in her father. Social History:  Patient  reports that she quit smoking about 51 years ago. Her smoking use included cigarettes. She has never used smokeless tobacco. She reports current alcohol use. She reports that she does not use drugs.  Review of Systems: Constitutional: Negative for fever malaise or anorexia Cardiovascular: negative for chest pain Respiratory: negative for SOB or persistent cough Gastrointestinal: negative for abdominal pain  Objective  Vitals: BP 132/76   Pulse 62   Temp 97.7 F (36.5 C)   Ht 5' 4 (1.626 m)   Wt 118 lb 6.4 oz (53.7 kg)   SpO2 98%    BMI 20.32 kg/m  General: no acute distress , A&Ox3 HEENT: PEERL, conjunctiva normal, neck is supple Cardiovascular:  RRR without murmur or gallop.  Respiratory:  Good breath sounds bilaterally, CTAB with normal respiratory effort Gastrointestinal: soft, flat abdomen, normal active bowel sounds, no palpable masses, no hepatosplenomegaly, no appreciated hernias Nontender No chest wall ttp Back: no spinal ttp Skin:  Warm, no rashes Commons side effects, risks, benefits, and alternatives for medications and treatment plan prescribed today were discussed, and the patient expressed understanding of the given instructions. Patient is instructed to call or message via MyChart if he/she has any questions or concerns regarding our treatment plan. No barriers to understanding were identified. We discussed Red Flag symptoms and signs in detail. Patient expressed understanding regarding what to do in case of urgent or emergency type symptoms.  Medication list was reconciled, printed and provided to the patient in AVS. Patient instructions and summary information was reviewed with the patient as documented in the AVS. This note was prepared with assistance of Dragon voice recognition software. Occasional wrong-word or sound-a-like substitutions may have occurred due to the inherent limitations of voice recognition software

## 2024-11-14 NOTE — Progress Notes (Unsigned)
 Allison [name] [address] [place] [address] Phone: [phone] Subjective:   Allison [name], am serving as a scribe for [name].  I'm seeing this patient by the request  of:  Allison Lavern CROME, MD  CC: back pain follow up   [name]:Dlagzrupcz  Allison [name] is a 73 y.o. female coming in with complaint of pain. Usually OMT but wants to discuss recent hospital visit. On Monday last week pain in her chest increased. Was unable to lie down or lift her arm without a sharp pain the was from middle of sternum to her t-spine. Pain is not as sharp now but she continues to have pain with any upper body movement. Today has ache that behind L breast. Taking a deep breath also increases her pain. Tingling near the R scapula. Would like like massage therapy recommendation. Also would like to try acupuncture and back braces. Taking a pottery class and leaning forward increases her chest pain and she feels a brace may be helpful.    PCP told patient to take 300mg  at bedtime and 300mg  in morning. Patient decided to take 300mg  at night and then 100mg  in the morning. Also using NSAIDs prn. Patient also added OTC prilosec.   Also would like to talk about a urologist.      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 DEXA 03/2020 Osteoporosis T = -2.5 at L1-2, lowest, femurs osteopenia, on fosamax . Continue fosamax .    Personal history of chemotherapy    Personal history of radiation therapy    PONV (postoperative nausea and vomiting)    Past Surgical History:  Procedure Laterality Date   ABDOMINAL HYSTERECTOMY     BREAST BIOPSY Left 1999   BREAST LUMPECTOMY Left 2000   COLONOSCOPY     OPEN  REDUCTION INTERNAL FIXATION (ORIF) METACARPAL Left 05/06/2020   Procedure: OPEN REDUCTION INTERNAL FIXATION (ORIF) LEFT THUMB METACARPAL FRACTURE;  Surgeon: Murrell Drivers, MD;  Location: [facility];  Service: Orthopedics;  Laterality: Left;  block in preop   TONSILECTOMY/ADENOIDECTOMY WITH MYRINGOTOMY     TONSILLECTOMY     Social History   Socioeconomic History   Marital status: Married    Spouse name: Not on file   Number of children: 0   Years of education: Not on file   Highest education level: Professional school degree (e.g., MD, DDS, DVM, JD)  Occupational History   Occupation: Retired     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 [employer]   Occupation: retired  Tobacco Use   Smoking status: Former    Current packs/day: 0.00    Types: Cigarettes    Quit date: 12/27/1972    Years since quitting: 51.9   Smokeless tobacco: Never  Vaping Use   Vaping status: Never Used  Substance and Sexual Activity   Alcohol use: Yes    Comment: wine and beer several night a week   Drug use: Never   Sexual activity: Not on file  Other Topics Concern   Not on file  Social History Narrative   Pets:  2 cats adopted at the start of  Covid    Social Drivers of Corporate Investment Banker Strain: Low Risk  (09/13/2024)   Overall Financial Resource Strain (CARDIA)    Difficulty of Paying Living Expenses: Not hard at all  Food Insecurity: No Food Insecurity (09/13/2024)   Hunger Vital Sign    Worried About Running Out of Food in the Last Year: Never true    Ran Out of Food in the Last Year: Never true  Transportation Needs: No Transportation Needs (09/13/2024)   PRAPARE - Administrator, Civil Service (Medical): No    Lack of Transportation (Non-Medical): No  Physical Activity: Sufficiently Active (09/13/2024)   Exercise Vital Sign    Days of Exercise per Week: 6 days    Minutes of Exercise per Session: 90 min  Stress: No Stress Concern Present (09/13/2024)    [name]    Feeling of Stress: Not at all  Social Connections: Socially Integrated (09/13/2024)   Social Connection and Isolation Panel    Frequency of Communication with Friends and Family: Once a week    Frequency of Social Gatherings with Friends and Family: Three times a week    Attends Religious Services: 1 to 4 times per year    Active Member of Clubs or Organizations: Yes    Attends Engineer, Structural: More than 4 times per year    Marital Status: Married   Allergies  Allergen Reactions   Penicillins     Childhood allergy   Sulfa Antibiotics Hives and Swelling   Family History  Problem Relation Age of Onset   Alcohol abuse Mother    Cancer Mother    Alcohol abuse Father    Depression Father    Early death Father    Alcohol abuse Brother    Cancer Brother    Breast cancer Neg Hx    Colon cancer Neg Hx    Esophageal cancer Neg Hx    Stomach cancer Neg Hx    Rectal cancer Neg Hx       Current Outpatient Medications (Respiratory):    cetirizine  (ZYRTEC  ALLERGY) 10 MG tablet, Take 1 tablet (10 mg total) by mouth at bedtime.   fluticasone  (FLONASE ) 50 MCG/ACT nasal spray, Place 1 spray into both nostrils daily.    Current Outpatient Medications (Other):    Calcium Citrate-Vitamin D  (CALCIUM + D PO), Take by mouth.   diclofenac  sodium (VOLTAREN ) 1 % GEL, Apply 2 grams topically to affected area qid   gabapentin  (NEURONTIN ) 300 MG capsule, Take 1 capsule (300 mg total) by mouth 2 (two) times daily.   MAGNESIUM PO, Take by mouth.   Multiple Vitamins-Minerals (MULTIVIT/MULTIMINERAL ADULT PO), Take by mouth.   NON FORMULARY, Tart cherry extract   ondansetron  (ZOFRAN ) 4 MG tablet, Take 1 tablet (4 mg total) by mouth every 8 (eight) hours as needed for nausea or vomiting.   sertraline  (ZOLOFT ) 25 MG tablet, TAKE 1 TABLET DAILY   Turmeric 500 MG TABS, Take by mouth.   Vitamin D , Cholecalciferol,  10 MCG (400 UNIT) TABS, SMARTSIG:1 Tablet(s) By Mouth   Reviewed prior external information including notes and imaging from  primary care provider As well as notes that were available from care everywhere and other healthcare systems.  Past medical history, social, surgical and family history all reviewed in electronic medical record.  No pertanent information unless stated regarding to the chief complaint.   Review of Systems:  No headache, visual changes, nausea,  vomiting, diarrhea, constipation, dizziness, abdominal pain, skin rash, fevers, chills, night sweats, weight loss, swollen lymph nodes, body aches, joint swelling, chest pain, shortness of breath, mood changes. POSITIVE muscle aches denies though any shortness of breath  Objective  There were no vitals taken for this visit.   General: No apparent distress alert and oriented x3 mood and affect normal, dressed appropriately.  HEENT: Pupils equal, extraocular movements intact  Respiratory: Patient's speak in full sentences and does not appear short of breath  Cardiovascular: No lower extremity edema, non tender, no erythema  Patient still has some discomfort with some deep breaths noted.  Seems to be still around the and sternal area.    Impression and Recommendations:     The above documentation has been reviewed and is accurate and complete Gloriajean Okun M Cynia Abruzzo, DO

## 2024-11-15 ENCOUNTER — Ambulatory Visit: Admitting: Family Medicine

## 2024-11-15 VITALS — BP 98/64 | HR 68 | Ht 64.0 in | Wt 122.0 lb

## 2024-11-15 DIAGNOSIS — G8929 Other chronic pain: Secondary | ICD-10-CM | POA: Diagnosis not present

## 2024-11-15 DIAGNOSIS — R079 Chest pain, unspecified: Secondary | ICD-10-CM

## 2024-11-15 DIAGNOSIS — R1013 Epigastric pain: Secondary | ICD-10-CM | POA: Diagnosis not present

## 2024-11-15 MED ORDER — COLCHICINE 0.6 MG PO TABS: 0.6000 mg | ORAL_TABLET | Freq: Two times a day (BID) | ORAL | 0 refills | Status: DC

## 2024-11-15 NOTE — Assessment & Plan Note (Signed)
 This to be the more secondary to chronic chest discomfort and sternal discomfort.  Patient did have a CT scan that ruled out any type of dissection.  Would like to get an echocardiogram to further evaluate for any type of pericardial effusion that could be contributing.  In addition to this we discussed the possibility of arrhythmias.  Costochondritis likely the inflammation of the costosternal area would be within the differential but likelihood but given prednisone .  Atypical neuritis would be another possibility would consider the possibility of acyclovir as a treatment.  This has been difficult and patient's pain can be quite severe.  Will see how patient responds to these trial of other medications as well as the echocardiogram and then follow-up again in 2 months

## 2024-11-15 NOTE — Patient Instructions (Addendum)
 Echo ordered Colchcine 2x a dy for 5 days Get back to us  if not better will do acylcovir 400mg  TID 5 days [facility] [phone] See me again in 2 months

## 2024-11-20 ENCOUNTER — Encounter: Payer: Self-pay | Admitting: Family Medicine

## 2024-11-21 ENCOUNTER — Other Ambulatory Visit: Payer: Self-pay

## 2024-11-21 MED ORDER — COLCHICINE 0.6 MG PO TABS: 0.6000 mg | ORAL_TABLET | Freq: Two times a day (BID) | ORAL | 0 refills | Status: DC

## 2024-12-31 ENCOUNTER — Ambulatory Visit (HOSPITAL_COMMUNITY)
Admission: RE | Admit: 2024-12-31 | Discharge: 2024-12-31 | Disposition: A | Discharge status: Home / Self Care | Source: Ambulatory Visit | Attending: Family Medicine | Admitting: Family Medicine

## 2024-12-31 DIAGNOSIS — R079 Chest pain, unspecified: Secondary | ICD-10-CM | POA: Diagnosis present

## 2024-12-31 LAB — ECHOCARDIOGRAM COMPLETE
Area-P 1/2: 4.36 cm2
S' Lateral: 2.7 cm

## 2025-01-01 ENCOUNTER — Ambulatory Visit: Payer: Self-pay | Admitting: Family Medicine

## 2025-01-01 NOTE — Progress Notes (Unsigned)
 " [name] [address] [place] [address] Phone: [phone] Subjective:   Allison [name], am serving as a scribe for [name].  I'm seeing this patient by the request  of:  [name], MD  CC: Back pain, abdominal pain, irregular chest pain  [name]:Dlagzrupcz  11/15/2024 This to be the more secondary to chronic chest discomfort and sternal discomfort.  Patient did have a CT scan that ruled out any type of dissection.  Would like to get an echocardiogram to further evaluate for any type of pericardial effusion that could be contributing.  In addition to this we discussed the possibility of arrhythmias.  Costochondritis likely the inflammation of the costosternal area would be within the differential but likelihood but given prednisone .  Atypical neuritis would be another possibility would consider the possibility of acyclovir as a treatment.  This has been difficult and patient's pain can be quite severe.  Will see how patient responds to these trial of other medications as well as the echocardiogram and then follow-up again in 2 months     Update 01/02/2025 Allison [name] is a 74 y.o. female coming in with complaint of abdominal pain. Patient states she had an increase in the pain in her sternum in November. Took colchicine  for 5 days and this was helpful. Wants to know if she has gout in her sternum. Has not been taking more than 200mg  of gabapentin  due to long-term side effects that she was reading about recently.   Has had some L shin pain recently due to her back. Overall feeling good.     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 DEXA 03/2020  Osteoporosis T = -2.5 at L1-2, lowest, femurs osteopenia, on fosamax . Continue fosamax .    Personal history of chemotherapy    Personal history of radiation therapy    PONV (postoperative nausea and vomiting)    Past Surgical History:  Procedure Laterality Date   ABDOMINAL HYSTERECTOMY     BREAST BIOPSY Left 1999   BREAST LUMPECTOMY Left 2000   COLONOSCOPY     OPEN REDUCTION INTERNAL FIXATION (ORIF) METACARPAL Left 05/06/2020   Procedure: OPEN REDUCTION INTERNAL FIXATION (ORIF) LEFT THUMB METACARPAL FRACTURE;  Surgeon: Murrell Drivers, MD;  Location: [facility];  Service: Orthopedics;  Laterality: Left;  block in preop   TONSILECTOMY/ADENOIDECTOMY WITH MYRINGOTOMY     TONSILLECTOMY     Social History   Socioeconomic History   Marital status: Married    Spouse name: Not on file   Number of children: 0   Years of education: Not on file   Highest education level: Professional school degree (e.g., MD, DDS, DVM, JD)  Occupational History   Occupation: Retired     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 [employer]   Occupation: retired  Tobacco Use   Smoking status: Former    Current packs/day: 0.00    Types: Cigarettes    Quit date: 12/27/1972    Years since quitting: 52.0   Smokeless tobacco: Never  Vaping Use   Vaping status: Never Used  Substance and  Sexual Activity   Alcohol use: Yes    Comment: wine and beer several night a week   Drug use: Never   Sexual activity: Not on file  Other Topics Concern   Not on file  Social History Narrative   Pets:  2 cats adopted at the start of Covid    Social Drivers of Health   Tobacco Use: Medium Risk (11/09/2024)   Patient History    Smoking Tobacco Use: Former    Smokeless Tobacco Use: Never    Passive Exposure: Not on file  Financial Resource Strain: Low Risk (09/13/2024)   Overall Financial Resource Strain (CARDIA)    Difficulty of Paying Living Expenses: Not hard at all  Food Insecurity: No Food Insecurity  (09/13/2024)   Epic    Worried About Programme Researcher, Broadcasting/film/video in the Last Year: Never true    Ran Out of Food in the Last Year: Never true  Transportation Needs: No Transportation Needs (09/13/2024)   Epic    Lack of Transportation (Medical): No    Lack of Transportation (Non-Medical): No  Physical Activity: Sufficiently Active (09/13/2024)   Exercise Vital Sign    Days of Exercise per Week: 6 days    Minutes of Exercise per Session: 90 min  Stress: No Stress Concern Present (09/13/2024)   [name]    Feeling of Stress: Not at all  Social Connections: Socially Integrated (09/13/2024)   Social Connection and Isolation Panel    Frequency of Communication with Friends and Family: Once a week    Frequency of Social Gatherings with Friends and Family: Three times a week    Attends Religious Services: 1 to 4 times per year    Active Member of Clubs or Organizations: Yes    Attends Banker Meetings: More than 4 times per year    Marital Status: Married  Depression (PHQ2-9): Low Risk (11/09/2024)   Depression (PHQ2-9)    PHQ-2 Score: 0  Alcohol Screen: Low Risk (09/13/2024)   Alcohol Screen    Last Alcohol Screening Score (AUDIT): 4  Housing: Low Risk (09/13/2024)   Epic    Unable to Pay for Housing in the Last Year: No    Number of Times Moved in the Last Year: 0    Homeless in the Last Year: No  Utilities: Not At Risk (06/05/2024)   AHC Utilities    Threatened with loss of utilities: No  Health Literacy: Adequate Health Literacy (06/05/2024)   B1300 Health Literacy    Frequency of need for help with medical instructions: Never   Allergies[1] Family History  Problem Relation Age of Onset   Alcohol abuse Mother    Cancer Mother    Alcohol abuse Father    Depression Father    Early death Father    Alcohol abuse Brother    Cancer Brother    Breast cancer Neg Hx    Colon cancer Neg Hx    Esophageal cancer Neg  Hx    Stomach cancer Neg Hx    Rectal cancer Neg Hx     Current Outpatient Medications (Respiratory):    cetirizine  (ZYRTEC  ALLERGY) 10 MG tablet, Take 1 tablet (10 mg total) by mouth at bedtime.   fluticasone  (FLONASE ) 50 MCG/ACT nasal spray, Place 1 spray into both nostrils daily.  Current Outpatient Medications (Analgesics):    colchicine  0.6 MG tablet, Take 1 tablet (0.6 mg total) by mouth 2 (two) times daily.  Current  Outpatient Medications (Other):    Calcium Citrate-Vitamin D  (CALCIUM + D PO), Take by mouth.   diclofenac  sodium (VOLTAREN ) 1 % GEL, Apply 2 grams topically to affected area qid   gabapentin  (NEURONTIN ) 300 MG capsule, Take 1 capsule (300 mg total) by mouth 2 (two) times daily.   MAGNESIUM PO, Take by mouth.   Multiple Vitamins-Minerals (MULTIVIT/MULTIMINERAL ADULT PO), Take by mouth.   NON FORMULARY, Tart cherry extract   ondansetron  (ZOFRAN ) 4 MG tablet, Take 1 tablet (4 mg total) by mouth every 8 (eight) hours as needed for nausea or vomiting.   sertraline  (ZOLOFT ) 25 MG tablet, TAKE 1 TABLET DAILY   Turmeric 500 MG TABS, Take by mouth.   Vitamin D , Cholecalciferol, 10 MCG (400 UNIT) TABS, SMARTSIG:1 Tablet(s) By Mouth   Reviewed prior external information including notes and imaging from  primary care provider As well as notes that were available from care everywhere and other healthcare systems.  Past medical history, social, surgical and family history all reviewed in electronic medical record.  No pertanent information unless stated regarding to the chief complaint.   Review of Systems:  No headache, visual changes, nausea, vomiting, diarrhea, constipation, dizziness, abdominal pain, skin rash, fevers, chills, night sweats, weight loss, swollen lymph nodes, body aches, joint swelling, chest pain, shortness of breath, mood changes. POSITIVE muscle aches  Objective  Blood pressure 118/82, pulse 65, height 5' 4 (1.626 m), weight 121 lb (54.9 kg), SpO2  98%.   General: No apparent distress alert and oriented x3 mood and affect normal, dressed appropriately.  HEENT: Pupils equal, extraocular movements intact  Respiratory: Patient's speak in full sentences and does not appear short of breath  Cardiovascular: No lower extremity edema, non tender, no erythema  Mild loss of lordosis, patient does have some arthritic changes noted of the back.  Patient does have tightness noted.   Osteopathic findings C2 flexed rotated and side bent right C4 flexed rotated and side bent left C6 flexed rotated and side bent left T3 extended rotated and side bent right inhaled third rib T9 extended rotated and side bent left L2 flexed rotated and side bent right Sacrum right on right    Impression and Recommendations:  Lumbar herniated disc Chronic problem, continues to respond well to osteopathic manipulation.  Discussed with patient to continue to stay active.  Imaging does not show anything significant.  Increase activity slowly.  Discussed icing regimen.  Follow-up again in 6 to 8 weeks  Osteoporosis of lumbar spine Discussed icing regimen and home exercises, which activities to do and which ones to avoid.  Increase activity slowly.  Follow-up again in 6 to 12 weeks    Decision today to treat with OMT was based on Physical Exam  After verbal consent patient was treated with  ME, FPR techniques in cervical, thoracic, rib, lumbar and sacral areas, all areas are chronic   Patient tolerated the procedure well with improvement in symptoms  Patient given exercises, stretches and lifestyle modifications  See medications in patient instructions if given  Patient will follow up in 4-8 weeks  The above documentation has been reviewed and is accurate and complete Allison Bramhall M Rakisha Pincock, DO        [1]  Allergies Allergen Reactions   Penicillins     Childhood allergy   Sulfa Antibiotics Hives and Swelling   "

## 2025-01-02 ENCOUNTER — Ambulatory Visit: Admitting: Family Medicine

## 2025-01-02 ENCOUNTER — Encounter: Payer: Self-pay | Admitting: Family Medicine

## 2025-01-02 VITALS — BP 118/82 | HR 65 | Ht 64.0 in | Wt 121.0 lb

## 2025-01-02 DIAGNOSIS — M9903 Segmental and somatic dysfunction of lumbar region: Secondary | ICD-10-CM | POA: Diagnosis not present

## 2025-01-02 DIAGNOSIS — M810 Age-related osteoporosis without current pathological fracture: Secondary | ICD-10-CM | POA: Diagnosis not present

## 2025-01-02 DIAGNOSIS — M9902 Segmental and somatic dysfunction of thoracic region: Secondary | ICD-10-CM

## 2025-01-02 DIAGNOSIS — M9908 Segmental and somatic dysfunction of rib cage: Secondary | ICD-10-CM | POA: Diagnosis not present

## 2025-01-02 DIAGNOSIS — G8929 Other chronic pain: Secondary | ICD-10-CM

## 2025-01-02 DIAGNOSIS — M940 Chondrocostal junction syndrome [Tietze]: Secondary | ICD-10-CM | POA: Diagnosis not present

## 2025-01-02 DIAGNOSIS — M9904 Segmental and somatic dysfunction of sacral region: Secondary | ICD-10-CM | POA: Diagnosis not present

## 2025-01-02 DIAGNOSIS — M5126 Other intervertebral disc displacement, lumbar region: Secondary | ICD-10-CM | POA: Diagnosis not present

## 2025-01-02 DIAGNOSIS — M9901 Segmental and somatic dysfunction of cervical region: Secondary | ICD-10-CM | POA: Diagnosis not present

## 2025-01-02 MED ORDER — COLCHICINE 0.6 MG PO TABS: 0.6000 mg | ORAL_TABLET | Freq: Two times a day (BID) | ORAL | 1 refills | Status: AC

## 2025-01-02 NOTE — Assessment & Plan Note (Signed)
 Discussed icing regimen and home exercises, which activities to do and which ones to avoid.  Increase activity slowly.  Follow-up again in 6 to 12 weeks

## 2025-01-02 NOTE — Assessment & Plan Note (Signed)
 Chronic problem, continues to respond well to osteopathic manipulation.  Discussed with patient to continue to stay active.  Imaging does not show anything significant.  Increase activity slowly.  Discussed icing regimen.  Follow-up again in 6 to 8 weeks

## 2025-01-02 NOTE — Assessment & Plan Note (Signed)
 Patient is due for colonoscopy so we will refer her accordingly

## 2025-01-02 NOTE — Assessment & Plan Note (Signed)
 Has had intermittent costochondritis.  Discussed with patient about different treatment aspects.  Given colchicine  to take in burst formation but may need allopurinol long-term if this continues.  Tietz syndrome is another possibility.  Patient has been worked up for cardiovascular, pulmonary and abdominal diagnosis.  Patient was a longtime smoker and should be set up for screening.  In the near future.

## 2025-01-02 NOTE — Patient Instructions (Signed)
 Great to see you Colchicine  2x a day 60# with 1 refill See me in 2 months

## 2025-01-07 ENCOUNTER — Ambulatory Visit: Payer: Medicare Other | Admitting: Family Medicine

## 2025-01-16 ENCOUNTER — Encounter: Payer: Self-pay | Admitting: Family Medicine

## 2025-01-16 ENCOUNTER — Ambulatory Visit: Admitting: Family Medicine

## 2025-01-16 VITALS — BP 124/80 | HR 56 | Temp 97.5°F | Ht 64.0 in | Wt 119.4 lb

## 2025-01-16 DIAGNOSIS — M810 Age-related osteoporosis without current pathological fracture: Secondary | ICD-10-CM | POA: Diagnosis not present

## 2025-01-16 DIAGNOSIS — M1611 Unilateral primary osteoarthritis, right hip: Secondary | ICD-10-CM

## 2025-01-16 DIAGNOSIS — R0789 Other chest pain: Secondary | ICD-10-CM | POA: Diagnosis not present

## 2025-01-16 DIAGNOSIS — Z853 Personal history of malignant neoplasm of breast: Secondary | ICD-10-CM

## 2025-01-16 DIAGNOSIS — F341 Dysthymic disorder: Secondary | ICD-10-CM | POA: Diagnosis not present

## 2025-01-16 DIAGNOSIS — M5126 Other intervertebral disc displacement, lumbar region: Secondary | ICD-10-CM

## 2025-01-16 LAB — LIPID PANEL
Cholesterol: 245 mg/dL — ABNORMAL HIGH (ref 28–200)
HDL: 82.9 mg/dL
LDL Cholesterol: 150 mg/dL — ABNORMAL HIGH (ref 10–99)
NonHDL: 161.7
Total CHOL/HDL Ratio: 3
Triglycerides: 59 mg/dL (ref 10.0–149.0)
VLDL: 11.8 mg/dL (ref 0.0–40.0)

## 2025-01-16 LAB — COMPREHENSIVE METABOLIC PANEL WITH GFR
ALT: 11 U/L (ref 3–35)
AST: 20 U/L (ref 5–37)
Albumin: 4.3 g/dL (ref 3.5–5.2)
Alkaline Phosphatase: 37 U/L — ABNORMAL LOW (ref 39–117)
BUN: 14 mg/dL (ref 6–23)
CO2: 29 meq/L (ref 19–32)
Calcium: 9.9 mg/dL (ref 8.4–10.5)
Chloride: 99 meq/L (ref 96–112)
Creatinine, Ser: 0.79 mg/dL (ref 0.40–1.20)
GFR: 74.16 mL/min
Glucose, Bld: 80 mg/dL (ref 70–99)
Potassium: 4.7 meq/L (ref 3.5–5.1)
Sodium: 134 meq/L — ABNORMAL LOW (ref 135–145)
Total Bilirubin: 0.9 mg/dL (ref 0.2–1.2)
Total Protein: 6.7 g/dL (ref 6.0–8.3)

## 2025-01-16 LAB — URIC ACID: Uric Acid, Serum: 3.5 mg/dL (ref 2.4–7.0)

## 2025-01-16 MED ORDER — SERTRALINE HCL 25 MG PO TABS: 25.0000 mg | ORAL_TABLET | Freq: Every day | ORAL | 3 refills | Status: AC

## 2025-01-16 NOTE — Patient Instructions (Signed)
 Please return in 12 months for your annual complete physical; please come fasting.   I will release your lab results to you on your MyChart account with further instructions. You may see the results before I do, but when I review them I will send you a message with my report or have my assistant call you if things need to be discussed. Please reply to my message with any questions. Thank you!   You can call [facility] to get set up for your colonoscopy which is due by 11/2025. Your GI physician there is Dr. Norleen Kiang.   If you have any questions or concerns, please don't hesitate to send me a message via MyChart or call the office at [phone]. Thank you for visiting with us  today! It's our pleasure caring for you.

## 2025-01-16 NOTE — Progress Notes (Signed)
 " Subjective  Chief Complaint  Patient presents with   Annual Exam    Annual exam with pcp. No questions or concerns. Has fasted for labs today.     HPI: Allison [name] is a 74 y.o. female who presents to [facility] [place] today for a Female Wellness Visit. She also has the concerns and/or needs as listed above in the chief complaint. These will be addressed in addition to the Health Maintenance Visit.   Wellness Visit: annual visit with health maintenance review and exam  HM: mammo to be scheduled. DEXA reviewed and improved: on drug holiday x 2 years. Reviewed sports med notes. Due colonoscopy 11/2005. Has seen Dr. Norleen Kiang for endoscopy last year so is established there. Healthy lifestyle  Chronic disease f/u and/or acute problem visit: (deemed necessary to be done in addition to the wellness visit): Discussed the use of AI scribe software for clinical note transcription with the patient, who gave verbal consent to proceed.  History of Present Illness [name] is a 74 year old female who presents for follow-up of chest pain and recent episodes of intense pain.  Chest pain, atypical - Experienced an episode of intense, sharp chest pain in November, followed by a similar episode five to six weeks later in December - Pain was exacerbated by movement - No current pain since the last episode in December - No new symptoms since the last episode - Chest pain has been present for approximately fifteen years, with episodes of severe pain - nl endoscopy and nl echocardiogram. - responds to colchicine .  - no h/o gout  Costochondritis and differential diagnosis - History of costochondritis - Tietze syndrome and internal shingles considered as possible causes of symptoms  Pain management/ lumbar OA - Gabapentin  initially prescribed at 200 mg for back pain, later increased to 300 mg - Gabapentin  dose increased during recent pain episodes, but did not  alleviate pain - Cautious about taking high doses of gabapentin  due to potential side effects  Dysthymia remains well controlled on low dose zoloft .  Breast cancer history and colonoscopy surveillance - History of breast cancer - Advised to have more frequent colonoscopies - Last colonoscopy was normal - Due for another colonoscopy at the end of the year  Cardiac evaluation - Recent echocardiogram was normal    Assessment  1. Dysthymia   2. Osteoporosis of lumbar spine   3. Lumbar herniated disc   4. Primary osteoarthritis of right hip   5. Hx of breast cancer, left   6. Atypical chest pain      Plan  Female Wellness Visit: Age appropriate Health Maintenance and Prevention measures were discussed with patient. Included topics are cancer screening recommendations, ways to keep healthy (see AVS) including dietary and exercise recommendations, regular eye and dental care, use of seat belts, and avoidance of moderate alcohol use and tobacco use. For colonoscopy this year, [facility], Dr. Kiang. Pt to call. BMI: discussed patient's BMI and encouraged positive lifestyle modifications to help get to or maintain a target BMI. HM needs and immunizations were addressed and ordered. See below for orders. See HM and immunization section for updates. Routine labs and screening tests ordered including cmp, cbc and lipids where appropriate. Discussed recommendations regarding Vit D and calcium supplementation (see AVS)  Chronic disease management visit and/or acute problem visit: Assessment and Plan Assessment & Plan Atypical chest pain Intermittent atypical chest pain since November, with a severe episode in December. Differential  diagnosis includes gout, Tietz syndrome, and internal shingles. Colchicine  has been effective in managing symptoms. Gabapentin  was ineffective for chest pain but is used for back pain. No recent episodes since December. Uric acid levels have not been checked  previously. - Ordered uric acid level - Continue colchicine  as needed for chest pain - Continue gabapentin  for back pain - check lipids and cmp  Osteoporosis of lumbar spine Bone density has improved. Currently off Fosamax  for two years with plans to recheck bone density. - Continue current management without Fosamax  - Will recheck bone density in two years  Lumbar herniated disc Chronic lumbar herniated disc managed with gabapentin  for comfort. - Continue gabapentin  for lumbar herniated disc  Personal history of breast cancer Breast cancer with regular follow-up. Mammograms are up to date. Colonoscopy is due this year due to increased frequency of screening related to breast cancer history. - Ensure mammograms are up to date - Schedule colonoscopy for this year  Continue zoloft  for dysthymia: controlled.   Follow up: 12 mo for cpe  Orders Placed This Encounter  Procedures   Lipid panel   Comprehensive metabolic panel with GFR   Uric acid   Meds ordered this encounter  Medications   sertraline  (ZOLOFT ) 25 MG tablet    Sig: Take 1 tablet (25 mg total) by mouth daily.    Dispense:  90 tablet    Refill:  3      Body mass index is 20.49 kg/m. Wt Readings from Last 3 Encounters:  01/16/25 119 lb 6.4 oz (54.2 kg)  01/02/25 121 lb (54.9 kg)  11/15/24 122 lb (55.3 kg)     Patient Active Problem List   Diagnosis Date Noted Date Diagnosed   Dysthymia 03/23/2019     Priority: High    Stable on Zoloft  25 mg q hs. Continue.     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 DEXA 03/2020 Osteoporosis T = -2.5 at L1-2, lowest, femurs osteopenia, on fosamax . Continue fosamax .  DEXA  05/2022 Osteoporosis T = -2.4 at L1-2, stable. Femurs osteopenia, on fosamax . Continue fosamax .  DEXA 08/2024: osteopenia lowest T=-2.3 L-spine, femur osteopenia -1.7, on fosamax  > 5 years. Rec drug holiday.  Recheck dexa 2 years.    Primary osteoarthritis of right hip 11/02/2019     Priority: Medium    Lumbar herniated disc 03/23/2019     Priority: Medium     Hx of bulging disc. Has been seeing Dr. Raenell, would like to see Dr. Marquette.    Chronic allergic rhinitis 01/04/2023     Priority: Low   Sensorineural hearing loss (SNHL) of both ears 01/04/2022     Priority: Low    [facility]; hearing aides    Metatarsalgia of left foot 11/02/2019     Priority: Low   Costochondritis 01/02/2025    Cyst of spleen 05/15/2024    Abdominal pain, chronic, right lower quadrant 03/30/2024    Abdominal pain, chronic, epigastric 04/14/2022    Nonallopathic lesion of lumbosacral region 10/16/2019    Nonallopathic lesion of sacral region 10/16/2019    Nonallopathic lesion of thoracic  region 10/16/2019    Health Maintenance  Topic Date Due   Mammogram  02/27/2025   COVID-19 Vaccine (5 - 2025-26 season) 02/01/2025 (Originally 08/27/2024)   Medicare Annual Wellness (AWV)  06/05/2025   Colonoscopy  12/07/2025   DTaP/Tdap/Td (2 - Td or Tdap) 02/10/2026   Bone Density Scan  08/30/2026   Pneumococcal Vaccine: 50+ Years  Completed   Influenza Vaccine  Completed   Hepatitis C Screening  Completed   Zoster Vaccines- Shingrix  Completed   Meningococcal B Vaccine  Aged Out   Immunization History  Administered Date(s) Administered   Fluad Quad(high Dose 65+) 09/04/2019, 09/04/2020, 10/07/2021, 09/10/2022   Fluad Trivalent(High Dose 65+) 09/11/2023   INFLUENZA, HIGH DOSE SEASONAL PF 09/17/2024   Influenza-Unspecified 02/07/2015, 09/02/2018   PFIZER(Purple Top)SARS-COV-2 Vaccination 01/15/2020, 02/05/2020, 09/30/2020   PNEUMOCOCCAL CONJUGATE-20 01/04/2022   Pfizer Covid-19 Vaccine Bivalent Booster 32yrs  & up 04/22/2021   Pneumococcal-Unspecified 03/15/2018   Respiratory Syncytial Virus Vaccine,Recomb Aduvanted(Arexvy) 09/10/2022   Tdap 02/11/2016   Zoster Recombinant(Shingrix) 09/07/2011, 01/06/2019, 05/08/2019   We updated and reviewed the patient's past history in detail and it is documented below. Allergies: Patient is allergic to penicillins and sulfa antibiotics. Past Medical History Patient  has a past medical history of Allergy (March 2022), Anxiety, Arthritis (March 2022), Cancer [facility]) (1999), Chicken pox, Depression (1994), Diverticulitis, Lumbar herniated disc, Osteoporosis of lumbar spine (04/01/2020), Personal history of chemotherapy, Personal history of radiation therapy, and PONV (postoperative nausea and vomiting). Past Surgical History Patient  has a past surgical history that includes Breast lumpectomy (Left, 2000); Breast biopsy (Left, 1999); Tonsilectomy/adenoidectomy with myringotomy; Abdominal hysterectomy (1998 +/-); Tonsillectomy; Open reduction internal fixation (orif) metacarpal (Left, 05/06/2020); Colonoscopy; Tubal ligation (8015 +/-); Fracture surgery (May 2021); and Cosmetic surgery (2013). Family History: Patient family history includes Alcohol abuse in her brother, father, and mother; Cancer in her brother and mother; Depression in her father; Early death in her father. Social History:  Patient  reports that she quit smoking about 52 years ago. Her smoking use included cigarettes. She has never used smokeless tobacco. She reports current alcohol use. She reports that she does not use drugs.  Review of Systems: Constitutional: negative for fever or malaise Ophthalmic: negative for photophobia, double vision or loss of vision Cardiovascular: negative for chest pain, dyspnea on exertion, or new LE swelling Respiratory: negative for SOB or persistent cough Gastrointestinal: negative for abdominal pain, change in bowel habits or melena Genitourinary: negative for  dysuria or gross hematuria, no abnormal uterine bleeding or disharge Musculoskeletal: negative for new gait disturbance or muscular weakness Integumentary: negative for new or persistent rashes, no breast lumps Neurological: negative for TIA or stroke symptoms Psychiatric: negative for SI or delusions Allergic/Immunologic: negative for hives  Patient Care Team    Relationship Specialty Notifications Start End  [name], MD PCP - General Family Medicine  11/05/24   Haverstock, Tawni CROME, MD Referring Physician Dermatology  04/17/19   Waylan Cain, MD Consulting Physician Ophthalmology  04/17/19   Dr. Vaughan Clay, DDS Consulting Physician Dentistry  04/17/19   Tory Kirsch, AUD Consulting Physician Audiology  04/15/20   Claudene Arthea HERO, DO Consulting Physician Sports Medicine  04/15/20   Abran Norleen SAILOR, MD Consulting Physician Gastroenterology  01/16/25     Objective  Vitals: BP 124/80 (BP Location: Right Arm, Patient Position: Sitting, Cuff Size: Normal)   Pulse (!) 56   Temp (!) 97.5 F (36.4 C) (Temporal)   Ht 5' 4 (1.626 m)  Wt 119 lb 6.4 oz (54.2 kg)   SpO2 95%   BMI 20.49 kg/m  General:  Well developed, well nourished, no acute distress  Psych:  Alert and orientedx3,normal mood and affect HEENT:  Normocephalic, atraumatic, non-icteric sclera,  supple neck without adenopathy, mass or thyromegaly Cardiovascular:  Normal S1, S2, RRR without gallop, rub or murmur Respiratory:  Good breath sounds bilaterally, CTAB with normal respiratory effort Gastrointestinal: normal bowel sounds, soft, non-tender, no noted masses. No HSM MSK: extremities without edema, joints without erythema or swelling Neurologic:    Mental status is normal.  Gross motor and sensory exams are normal.  No tremor  Commons side effects, risks, benefits, and alternatives for medications and treatment plan prescribed today were discussed, and the patient expressed understanding of the given  instructions. Patient is instructed to call or message via MyChart if he/she has any questions or concerns regarding our treatment plan. No barriers to understanding were identified. We discussed Red Flag symptoms and signs in detail. Patient expressed understanding regarding what to do in case of urgent or emergency type symptoms.  Medication list was reconciled, printed and provided to the patient in AVS. Patient instructions and summary information was reviewed with the patient as documented in the AVS. This note was prepared with assistance of Dragon voice recognition software. Occasional wrong-word or sound-a-like substitutions may have occurred due to the inherent limitations of voice recognition software     "

## 2025-01-21 ENCOUNTER — Ambulatory Visit: Payer: Self-pay | Admitting: Family Medicine

## 2025-01-21 NOTE — Progress Notes (Signed)
 Labs reviewed.  The 10-year ASCVD risk score (Arnett DK, et al., 2019) is: 12.4%   Values used to calculate the score:     Age: 74 years     Clinically relevant sex: Female     Is Non-Hispanic African American: No     Diabetic: No     Tobacco smoker: No     Systolic Blood Pressure: 124 mmHg     Is BP treated: No     HDL Cholesterol: 82.9 mg/dL     Total Cholesterol: 245 mg/dL Dear Allison [name], Thank you for allowing me to care for you at your recent office visit.  I wanted to let you know that I have reviewed your lab test results and am happy to report that they are mostly fine.  However, your cholesterol levels are much higher than when we last checked, at given these levels and your overall cardiovascular risk score which is now elevated, we should consider starting a cholesterol lowering medication.  I recommend crestor 5mg  nightly. Let me know your thoughts. Would be happy to have a visit if needed to discuss.    Sincerely, Dr. Jodie

## 2025-01-22 ENCOUNTER — Other Ambulatory Visit (HOSPITAL_BASED_OUTPATIENT_CLINIC_OR_DEPARTMENT_OTHER): Payer: Self-pay | Admitting: Family Medicine

## 2025-01-22 DIAGNOSIS — Z1231 Encounter for screening mammogram for malignant neoplasm of breast: Secondary | ICD-10-CM

## 2025-01-23 NOTE — Telephone Encounter (Signed)
 Please see pt msg as FYI for upcoming appointment

## 2025-01-29 ENCOUNTER — Ambulatory Visit: Admitting: Family Medicine

## 2025-02-05 ENCOUNTER — Ambulatory Visit: Admitting: Family Medicine

## 2025-03-04 ENCOUNTER — Ambulatory Visit (HOSPITAL_BASED_OUTPATIENT_CLINIC_OR_DEPARTMENT_OTHER)

## 2025-03-04 ENCOUNTER — Ambulatory Visit: Admitting: Family Medicine

## 2025-06-10 ENCOUNTER — Ambulatory Visit

## 2025-06-25 ENCOUNTER — Ambulatory Visit

## 2026-01-17 ENCOUNTER — Ambulatory Visit: Admitting: Family Medicine
